# Patient Record
Sex: Female | Born: 1970
Health system: Southern US, Community
[De-identification: ages and names within clinical notes are randomized; demographics above are authoritative.]

## PROBLEM LIST (undated history)

## (undated) DIAGNOSIS — R112 Nausea with vomiting, unspecified: Secondary | ICD-10-CM

## (undated) DIAGNOSIS — D3500 Benign neoplasm of unspecified adrenal gland: Secondary | ICD-10-CM

## (undated) DIAGNOSIS — Z87730 Personal history of (corrected) cleft lip and palate: Secondary | ICD-10-CM

## (undated) DIAGNOSIS — E119 Type 2 diabetes mellitus without complications: Secondary | ICD-10-CM

## (undated) DIAGNOSIS — F419 Anxiety disorder, unspecified: Secondary | ICD-10-CM

## (undated) DIAGNOSIS — I499 Cardiac arrhythmia, unspecified: Secondary | ICD-10-CM

## (undated) DIAGNOSIS — C801 Malignant (primary) neoplasm, unspecified: Secondary | ICD-10-CM

## (undated) DIAGNOSIS — E278 Other specified disorders of adrenal gland: Secondary | ICD-10-CM

## (undated) DIAGNOSIS — D0511 Intraductal carcinoma in situ of right breast: Secondary | ICD-10-CM

## (undated) DIAGNOSIS — K219 Gastro-esophageal reflux disease without esophagitis: Secondary | ICD-10-CM

## (undated) DIAGNOSIS — G4730 Sleep apnea, unspecified: Secondary | ICD-10-CM

## (undated) DIAGNOSIS — Z9889 Other specified postprocedural states: Secondary | ICD-10-CM

## (undated) DIAGNOSIS — D72829 Elevated white blood cell count, unspecified: Secondary | ICD-10-CM

## (undated) HISTORY — PX: CHOLECYSTECTOMY: SHX55

## (undated) HISTORY — PX: CLEFT PALATE REPAIR: SUR1165

## (undated) MED FILL — Fosaprepitant Dimeglumine For IV Infusion 150 MG (Base Eq): INTRAVENOUS | Qty: 5 | Status: AC

---
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
## 1998-07-17 ENCOUNTER — Other Ambulatory Visit: Admission: RE | Admit: 1998-07-17 | Discharge: 1998-07-17 | Payer: Self-pay | Admitting: Obstetrics & Gynecology

## 1998-11-15 ENCOUNTER — Other Ambulatory Visit: Admission: RE | Admit: 1998-11-15 | Discharge: 1998-11-15 | Payer: Self-pay | Admitting: Obstetrics & Gynecology

## 2002-12-09 ENCOUNTER — Emergency Department (HOSPITAL_COMMUNITY): Admission: EM | Admit: 2002-12-09 | Discharge: 2002-12-10 | Payer: Self-pay | Admitting: *Deleted

## 2002-12-09 ENCOUNTER — Encounter: Payer: Self-pay | Admitting: *Deleted

## 2004-12-01 ENCOUNTER — Emergency Department: Payer: Self-pay | Admitting: Emergency Medicine

## 2006-07-03 ENCOUNTER — Ambulatory Visit: Payer: Self-pay | Admitting: Unknown Physician Specialty

## 2007-05-19 ENCOUNTER — Ambulatory Visit: Payer: Self-pay | Admitting: Gynecologic Oncology

## 2007-09-27 ENCOUNTER — Ambulatory Visit: Payer: Self-pay | Admitting: Gynecologic Oncology

## 2007-10-25 ENCOUNTER — Ambulatory Visit: Payer: Self-pay | Admitting: Gynecologic Oncology

## 2008-02-06 ENCOUNTER — Emergency Department: Payer: Self-pay | Admitting: Emergency Medicine

## 2008-02-06 ENCOUNTER — Other Ambulatory Visit: Payer: Self-pay

## 2008-09-19 ENCOUNTER — Ambulatory Visit: Payer: Self-pay | Admitting: Emergency Medicine

## 2008-10-12 ENCOUNTER — Emergency Department: Payer: Self-pay | Admitting: Unknown Physician Specialty

## 2008-11-10 ENCOUNTER — Ambulatory Visit: Payer: Self-pay | Admitting: Internal Medicine

## 2008-12-30 ENCOUNTER — Ambulatory Visit: Payer: Self-pay | Admitting: Internal Medicine

## 2009-02-01 ENCOUNTER — Ambulatory Visit: Payer: Self-pay | Admitting: Internal Medicine

## 2009-03-13 ENCOUNTER — Ambulatory Visit: Payer: Self-pay | Admitting: Internal Medicine

## 2009-04-15 ENCOUNTER — Ambulatory Visit: Payer: Self-pay | Admitting: Internal Medicine

## 2009-08-28 ENCOUNTER — Ambulatory Visit: Payer: Self-pay | Admitting: Family Medicine

## 2010-01-26 ENCOUNTER — Ambulatory Visit: Payer: Self-pay | Admitting: Internal Medicine

## 2010-02-23 ENCOUNTER — Ambulatory Visit: Payer: Self-pay | Admitting: Internal Medicine

## 2010-03-09 ENCOUNTER — Ambulatory Visit: Payer: Self-pay | Admitting: Family Medicine

## 2010-06-12 ENCOUNTER — Ambulatory Visit: Payer: Self-pay | Admitting: Internal Medicine

## 2010-06-16 ENCOUNTER — Ambulatory Visit: Payer: Self-pay | Admitting: Internal Medicine

## 2010-12-13 ENCOUNTER — Emergency Department: Payer: Self-pay

## 2011-03-26 ENCOUNTER — Ambulatory Visit: Payer: Self-pay | Admitting: Internal Medicine

## 2011-04-03 ENCOUNTER — Ambulatory Visit: Payer: Self-pay | Admitting: Family Medicine

## 2011-05-08 ENCOUNTER — Ambulatory Visit: Payer: Self-pay | Admitting: Family Medicine

## 2011-09-03 ENCOUNTER — Ambulatory Visit: Payer: Self-pay

## 2011-11-23 ENCOUNTER — Ambulatory Visit: Payer: Self-pay | Admitting: Family Medicine

## 2011-12-02 ENCOUNTER — Emergency Department: Payer: Self-pay

## 2011-12-02 LAB — CBC
HCT: 44.1 % (ref 35.0–47.0)
HGB: 14.6 g/dL (ref 12.0–16.0)
MCH: 27.3 pg (ref 26.0–34.0)
MCHC: 33 g/dL (ref 32.0–36.0)
MCV: 83 fL (ref 80–100)
Platelet: 262 10*3/uL (ref 150–440)
RBC: 5.33 10*6/uL — ABNORMAL HIGH (ref 3.80–5.20)
RDW: 14.3 % (ref 11.5–14.5)
WBC: 13.1 10*3/uL — ABNORMAL HIGH (ref 3.6–11.0)

## 2011-12-02 LAB — COMPREHENSIVE METABOLIC PANEL
Albumin: 4.3 g/dL (ref 3.4–5.0)
Alkaline Phosphatase: 106 U/L (ref 50–136)
Anion Gap: 9 (ref 7–16)
BUN: 13 mg/dL (ref 7–18)
Bilirubin,Total: 0.7 mg/dL (ref 0.2–1.0)
Calcium, Total: 9.5 mg/dL (ref 8.5–10.1)
Chloride: 100 mmol/L (ref 98–107)
Co2: 30 mmol/L (ref 21–32)
Creatinine: 0.81 mg/dL (ref 0.60–1.30)
EGFR (African American): >60
EGFR (Non-African Amer.): >60
Glucose: 101 mg/dL — ABNORMAL HIGH (ref 65–99)
Osmolality: 278 (ref 275–301)
Potassium: 3.3 mmol/L — ABNORMAL LOW (ref 3.5–5.1)
SGOT(AST): 51 U/L — ABNORMAL HIGH (ref 15–37)
SGPT (ALT): 82 U/L — ABNORMAL HIGH
Sodium: 139 mmol/L (ref 136–145)
Total Protein: 8.5 g/dL — ABNORMAL HIGH (ref 6.4–8.2)

## 2011-12-02 LAB — URINALYSIS, COMPLETE
Bacteria: NONE SEEN
Bilirubin,UR: NEGATIVE
Blood: NEGATIVE
Glucose,UR: NEGATIVE mg/dL (ref 0–75)
Ketone: NEGATIVE
Leukocyte Esterase: NEGATIVE
Nitrite: NEGATIVE
Ph: 6 (ref 4.5–8.0)
Protein: NEGATIVE
RBC,UR: 1 /HPF (ref 0–5)
Specific Gravity: 1.011 (ref 1.003–1.030)
Squamous Epithelial: <1
WBC UR: <1 /HPF (ref 0–5)

## 2011-12-02 LAB — LIPASE, BLOOD: Lipase: 99 U/L (ref 73–393)

## 2011-12-02 LAB — PREGNANCY, URINE: Pregnancy Test, Urine: NEGATIVE m[IU]/mL

## 2012-12-10 ENCOUNTER — Ambulatory Visit: Payer: Self-pay

## 2013-02-01 ENCOUNTER — Ambulatory Visit: Payer: Self-pay

## 2013-02-22 ENCOUNTER — Ambulatory Visit: Payer: Self-pay | Admitting: Emergency Medicine

## 2013-06-04 LAB — COMPREHENSIVE METABOLIC PANEL
Albumin: 4.2 g/dL (ref 3.4–5.0)
Alkaline Phosphatase: 111 U/L (ref 50–136)
Anion Gap: 7 (ref 7–16)
BUN: 9 mg/dL (ref 7–18)
Bilirubin,Total: 0.5 mg/dL (ref 0.2–1.0)
Calcium, Total: 9.4 mg/dL (ref 8.5–10.1)
Chloride: 104 mmol/L (ref 98–107)
Co2: 25 mmol/L (ref 21–32)
Creatinine: 0.87 mg/dL (ref 0.60–1.30)
EGFR (African American): >60
EGFR (Non-African Amer.): >60
Glucose: 104 mg/dL — ABNORMAL HIGH (ref 65–99)
Osmolality: 271 (ref 275–301)
Potassium: 3.1 mmol/L — ABNORMAL LOW (ref 3.5–5.1)
SGOT(AST): 24 U/L (ref 15–37)
SGPT (ALT): 36 U/L (ref 12–78)
Sodium: 136 mmol/L (ref 136–145)
Total Protein: 7.8 g/dL (ref 6.4–8.2)

## 2013-06-04 LAB — URINALYSIS, COMPLETE
Bacteria: NONE SEEN
Bilirubin,UR: NEGATIVE
Glucose,UR: NEGATIVE mg/dL (ref 0–75)
Ketone: NEGATIVE
Leukocyte Esterase: NEGATIVE
Nitrite: NEGATIVE
Ph: 5 (ref 4.5–8.0)
Protein: NEGATIVE
RBC,UR: 2 /HPF (ref 0–5)
Specific Gravity: 1.013 (ref 1.003–1.030)
Squamous Epithelial: 1
WBC UR: 3 /HPF (ref 0–5)

## 2013-06-04 LAB — CBC
HCT: 41.2 % (ref 35.0–47.0)
HGB: 13.8 g/dL (ref 12.0–16.0)
MCH: 27.1 pg (ref 26.0–34.0)
MCHC: 33.6 g/dL (ref 32.0–36.0)
MCV: 81 fL (ref 80–100)
Platelet: 237 10*3/uL (ref 150–440)
RBC: 5.11 10*6/uL (ref 3.80–5.20)
RDW: 13.9 % (ref 11.5–14.5)
WBC: 17.7 10*3/uL — ABNORMAL HIGH (ref 3.6–11.0)

## 2013-06-04 LAB — LIPASE, BLOOD: Lipase: 144 U/L (ref 73–393)

## 2013-06-04 LAB — TROPONIN I: Troponin-I: <0.02 ng/mL

## 2013-06-05 ENCOUNTER — Observation Stay: Payer: Self-pay | Admitting: Internal Medicine

## 2013-11-30 ENCOUNTER — Ambulatory Visit: Payer: Self-pay | Admitting: Internal Medicine

## 2014-04-07 LAB — LIPID PANEL
Cholesterol: 226 mg/dL — AB (ref 0–200)
HDL: 55 mg/dL (ref 35–70)
LDL Cholesterol: 149 mg/dL
Triglycerides: 112 mg/dL (ref 40–160)

## 2014-04-07 LAB — CBC AND DIFFERENTIAL: Hemoglobin: 13.9 g/dL (ref 12.0–16.0)

## 2014-04-07 LAB — BASIC METABOLIC PANEL
BUN: 19 mg/dL (ref 4–21)
Creatinine: 0.7 mg/dL (ref <=1.1)

## 2014-04-07 LAB — TSH: TSH: 0.58 u[IU]/mL (ref <=5.90)

## 2014-08-16 DIAGNOSIS — E250 Congenital adrenogenital disorders associated with enzyme deficiency: Secondary | ICD-10-CM | POA: Insufficient documentation

## 2014-12-16 NOTE — H&P (Signed)
PATIENT NAME:  Tina Carrillo, Tina Carrillo MR#:  [id number] DATE OF BIRTH:  Sep 03, 1970  DATE OF ADMISSION:  06/05/2013  REFERRING PHYSICIAN:  Dr. Corky Downs.   PRIMARY CARE PHYSICIAN:  Dr. Army Melia.   ENDOCRINOLOGIST:  Dr. Gabriel Carina.   CHIEF COMPLAINT:  Fevers, chills, sinus pain.   HISTORY OF PRESENT ILLNESS:  Tina Carrillo is a 44 year old Caucasian female with past medical history significant for congenital adrenal hyperplasia who is on chronic steroid therapy, who is presenting with subjective fever, chills and sinus pressure.  Symptoms began approximately three days ago after being exposed to a large amount of dust at work.  She noticed having frontal sinus pressure with greenish nasal discharge.  She also denotes having many positive sick contacts at work.  She notes said that she also has subjective fever and chills now complaining of chest congestion without any cough or shortness of breath.  Given her history of congenital adrenal hyperplasia she is on chronic steroids.  She has increased the dose of prednisone today, the day of admission, but presented regardless for worsening symptoms with associated generalized malaise.  She notes having multiple bouts of nonbloody, nonbilious emesis prior to arrival to the hospital without any diarrhea or constipation.  Currently, she is complaining only of sinus pressure.  No other complaints.    REVIEW OF SYSTEMS: CONSTITUTIONAL:  Positive for subjective fevers and chills as above.  Denies any weight loss or weakness.  EYES:  Denies any blurry vision, double vision or eye pain.  EARS, NOSE, THROAT:  Denies any ear pain or hearing loss.  Mentions nasal discharge and sinus pressure as above.  RESPIRATORY:  Positive for chest congestion, however denies any cough, wheeze or shortness of breath.  CARDIOVASCULAR:  Denies chest pain, palpitations, edema.  GASTROINTESTINAL:  Positive for nausea and emesis as above.  Denies any diarrhea or abdominal pain.  GENITOURINARY:  She  denies dysuria, hematuria.  ENDOCRINE:  Denies nocturia or thyroid problems.  HEMATOLOGIC AND LYMPHATIC:  Denies any easy bruising or bleeding.  SKIN:  Denies rashes or lesions.  MUSCULOSKELETAL:  Denies any pain in her back, neck, knees.  Denies any arthritis.  Denies any joint swelling.  NEUROLOGIC:  Denies any paralysis or paresthesias.  PSYCHIATRIC:  Denies any anxiety or depressive symptoms.   PAST MEDICAL HISTORY:  Congenital adrenal hyperplasia, recurrent sinusitis, history of cleft palate.   FAMILY HISTORY:  Mother with lupus.  Father with coronary artery disease as well as hypertension.   SOCIAL HISTORY:  Denies any tobacco use, occasional social alcohol intake.  Denies any drug use.  Works at [employer] as a Freight forwarder.   ALLERGIES:  CODEINE, HYDROCODONE, MORPHINE, NSAIDS AND TRAMADOL.   HOME MEDICATIONS:  Include fludrocortisone 0.1 mg by mouth daily, prednisone 5 mg 1 tab in the morning, 1/2 tab in the evening.   PHYSICAL EXAMINATION: VITAL SIGNS:  Temperature 99.9, heart rate 102, respirations 20, blood pressure 117/68, saturating 100% on room air.  Weight 83.9 kg, BMI of 36.1.  GENERAL:  Well-nourished, well-developed Caucasian female who is in no acute distress.  HEAD:  Normocephalic, atraumatic.  EYES:  Pupils equal, round, react to light, extraocular muscles intact.  No scleral icterus.  MOUTH:  Moist mucosal membranes.  Dentition intact.  No abscesses noted.   EARS, NOSE AND THROAT:  Clear without exudate.  No external lesions.  There is some erythema of the nasal turbinates bilaterally.  NECK:  Supple.  No thyromegaly or lymphadenopathy.  No JVD.  PULMONARY:  Clear to auscultation bilaterally without wheezes, rubs or rhonchi.  No use of accessory muscles.  Good respiratory effort.  CHEST:  Nontender to palpation.  CARDIOVASCULAR:  S1, S2, regular rate and rhythm.  No murmurs, rubs or gallops.  No peripheral edema.  Pedal pulses 2+ bilaterally.   GASTROINTESTINAL:  Soft, nontender, nondistended.  No masses.  No hepatosplenomegaly.  Positive bowel sounds.  MUSCULOSKELETAL:  No swelling, clubbing or edema.  Range of motion full in all extremities.  NEUROLOGIC:  Cranial nerves II through XII intact.  No gross neurological deficits.  Sensation and reflexes intact.  SKIN:  No ulcerations, lesions, rashes or cyanosis.  Skin is warm and dry.  Turgor syntax.  PSYCHIATRIC:  Mood and affect within normal limits.  Awake, alert, oriented x 3.  Insight and judgment intact.   LABORATORY DATA:  Sodium 136, potassium 3.1, chloride 104, bicarb 25, BUN 9, creatinine 0.87, glucose 104.  WBC 17.7, hemoglobin 13.8, hematocrit 41.2, platelets 273.  Urinalysis negative for signs of infection.  Chest x-ray:  Shallow lung volumes.  No acute cardiopulmonary process.   ASSESSMENT AND PLAN:  A 44 year old Caucasian female with history of congenital adrenal hyperplasia on chronic steroids presenting with fevers, chills and sinus pressure.  1.  Acute adrenal insufficiency.  She has been given dexamethasone 4 mg IV once.  We will continue this medication every 12 hours.  Consult endocrinology.  IV fluid hydration.  Cortisol level was drawn.    2.  Sinusitis.  She has been given Levaquin.  No clear indication for continued antibiotics given the duration of her symptoms has been 2 to 3 days and she has multiple positive sick contacts with similar conditions.  3.  Hypokalemia.  Replace with a goal potassium of 4.  4.  DVT prophylaxis.  Heparin subQ.  5.  THE PATIENT IS FULL CODE.   TIME SPENT:  45 minutes.     ____________________________ Aaron Mose. Hower, MD dkh:ea D: 06/05/2013 01:02:28 ET T: 06/05/2013 01:39:28 ET JOB#: [id number]  cc: Aaron Mose. Hower, MD, <Dictator> DAVID Woodfin Ganja MD ELECTRONICALLY SIGNED 06/05/2013 23:26

## 2014-12-16 NOTE — Discharge Summary (Signed)
PATIENT NAME:  Tina Carrillo, MCKESSON MR#:  [id number] DATE OF BIRTH:  08-15-1971  DATE OF ADMISSION:  06/05/2013 DATE OF DISCHARGE:  06/05/2013  ADMISSION DIAGNOSES: 1.  Adrenal insufficiency.  2.  Sinusitis.  DISCHARGE DIAGNOSES: 1.  Adrenal insufficiency.  2.  Acute sinusitis.   CONSULTATIONS:  None.   HOSPITAL COURSE: This is a 44 year old female with a history of adrenal hyperplasia, congenital, who presented with an acute adrenal insufficiency and acute sinusitis.  For further details, please refer to the H and P.   1.  Acute adrenal insufficiency, which was thought to be decompensated from her acute sinusitis. The patient was admitted with IV steroids. She had no issues with her blood pressure but because of her acute infection, it was thought that she may go into adrenal insufficiency. She was admitted observation overnight. Her blood pressure was stable. She was completely stable. Her IV steroids were discontinued. We did ask her to increase her p.o. steroids for a few days to get over the acute sinusitis and see Dr. Gabriel Carina on Monday. She was restarted back on her Florinef. 2.  Acute sinusitis, likely viral, but the patient says her symptoms improved with antibiotics. She will be discharged with amoxicillin and follow up with Dr. Gabriel Carina.   DISCHARGE MEDICATIONS: 1.  Florinef 0.1 mg daily.  2.  Prednisone 5 mg p.o. b.i.d. for the next few days and then she can take 5 mg in the morning and 2.5 mg in the evening.  3.  Amoxicillin 500 mg q. 8 hours x 10 days.   DISCHARGE DIET:  Regular.   DISCHARGE ACTIVITY:  As tolerated.  DISCHARGE FOLLOWUP:  The patient will follow up on Monday or Tuesday with Dr. Gabriel Carina and in 2 weeks with Dr. Army Melia.  The patient is medically stable for discharge.   TIME SPENT:  35 minutes   ____________________________ Malaya Cagley P. Benjie Karvonen, MD spm:ce D: 06/05/2013 10:55:47 ET T: 06/05/2013 11:46:51 ET JOB#: [id number]  cc: Brent Noto P. Benjie Karvonen, MD, <Dictator> A. Lavone Orn, MD Halina Maidens, MD  Donell Beers Braeton Wolgamott MD ELECTRONICALLY SIGNED 06/06/2013 13:02

## 2015-03-23 ENCOUNTER — Emergency Department: Payer: BLUE CROSS/BLUE SHIELD

## 2015-03-23 ENCOUNTER — Emergency Department
Admission: EM | Admit: 2015-03-23 | Discharge: 2015-03-23 | Disposition: A | Discharge status: Home / Self Care | Payer: BLUE CROSS/BLUE SHIELD | Attending: Emergency Medicine | Admitting: Emergency Medicine

## 2015-03-23 DIAGNOSIS — Z7952 Long term (current) use of systemic steroids: Secondary | ICD-10-CM | POA: Diagnosis not present

## 2015-03-23 DIAGNOSIS — Y9339 Activity, other involving climbing, rappelling and jumping off: Secondary | ICD-10-CM | POA: Insufficient documentation

## 2015-03-23 DIAGNOSIS — Y92410 Unspecified street and highway as the place of occurrence of the external cause: Secondary | ICD-10-CM | POA: Insufficient documentation

## 2015-03-23 DIAGNOSIS — Y998 Other external cause status: Secondary | ICD-10-CM | POA: Insufficient documentation

## 2015-03-23 DIAGNOSIS — S86912A Strain of unspecified muscle(s) and tendon(s) at lower leg level, left leg, initial encounter: Secondary | ICD-10-CM | POA: Insufficient documentation

## 2015-03-23 DIAGNOSIS — Z79899 Other long term (current) drug therapy: Secondary | ICD-10-CM | POA: Diagnosis not present

## 2015-03-23 DIAGNOSIS — X58XXXA Exposure to other specified factors, initial encounter: Secondary | ICD-10-CM | POA: Diagnosis not present

## 2015-03-23 DIAGNOSIS — S8992XA Unspecified injury of left lower leg, initial encounter: Secondary | ICD-10-CM | POA: Diagnosis present

## 2015-03-23 MED ORDER — TRAMADOL HCL 50 MG PO TABS: 50.0000 mg | ORAL_TABLET | Freq: Four times a day (QID) | ORAL | Status: DC | PRN

## 2015-03-23 MED ORDER — MELOXICAM 15 MG PO TABS: 15.0000 mg | ORAL_TABLET | Freq: Every day | ORAL | Status: DC

## 2015-03-23 MED ORDER — HYDROCODONE-ACETAMINOPHEN 5-325 MG PO TABS: 1.0000 | ORAL_TABLET | ORAL | Status: DC | PRN

## 2015-03-23 NOTE — ED Notes (Signed)
Pt stepped out of truck and landed on left knee; pt complains of left knee pain

## 2015-03-23 NOTE — ED Notes (Signed)
Ice pack given and placed on left knee

## 2015-03-23 NOTE — ED Notes (Signed)
AAOx3.  Skin warm and dry.  NAD 

## 2015-03-23 NOTE — ED Provider Notes (Signed)
[facility] Emergency Department Provider Note ____________________________________________  Time seen: Approximately 3:46 PM  I have reviewed the triage vital signs and the nursing notes.   HISTORY  Chief Complaint Knee Pain   HPI Tina Carrillo is a 44 y.o. female who presents to the emergency department for evaluation of left knee pain. She states she jumped from the tailgate of a truck and felt her knee "give way." She has pain in the anterior part of the knee that is not improving with rest and tylenol. Pain worse with movement and weight bearing.   No past medical history on file.  There are no active problems to display for this patient.   No past surgical history on file.  Current Outpatient Rx  Name  Route  Sig  Dispense  Refill  . dexamethasone (DECADRON) 0.5 MG tablet   Oral   Take 0.5 mg by mouth daily.         Marland Kitchen dexamethasone (DECADRON) 0.5 MG tablet   Oral   Take 1 mg by mouth daily.         . fludrocortisone (FLORINEF) 0.1 MG tablet   Oral   Take 0.1 mg by mouth daily.         Marland Kitchen HYDROcodone-acetaminophen (NORCO/VICODIN) 5-325 MG per tablet   Oral   Take 1 tablet by mouth every 4 (four) hours as needed.   12 tablet   0     Allergies Morphine and related and Oxycodone  No family history on file.  Social History History  Substance Use Topics  . Smoking status: Not on file  . Smokeless tobacco: Not on file  . Alcohol Use: Not on file    Review of Systems Constitutional: No recent illness. Eyes: No visual changes. ENT: No sore throat. Cardiovascular: Denies chest pain or palpitations. Respiratory: Denies shortness of breath. Gastrointestinal: No abdominal pain.  Genitourinary: Negative for dysuria. Musculoskeletal: Pain in left knee. Skin: Negative for rash. Neurological: Negative for headaches, focal weakness or numbness. 10-point ROS otherwise  negative.  ____________________________________________   PHYSICAL EXAM:  VITAL SIGNS: ED Triage Vitals  Enc Vitals Group     BP 03/23/15 1128 144/91 mmHg     Pulse Rate 03/23/15 1128 94     Resp 03/23/15 1128 20     Temp 03/23/15 1128 98.4 F (36.9 C)     Temp Source 03/23/15 1128 Oral     SpO2 03/23/15 1128 100 %     Weight 03/23/15 1124 200 lb (90.719 kg)     Height 03/23/15 1124 4' 11"  (1.499 m)     Head Cir --      Peak Flow --      Pain Score 03/23/15 1125 5     Pain Loc --      Pain Edu? --      Excl. in [place]? --     Constitutional: Alert and oriented. Well appearing and in no acute distress. Eyes: Conjunctivae are normal. EOMI. Head: Atraumatic. Nose: No congestion/rhinnorhea. Neck: No stridor.  Respiratory: Normal respiratory effort.   Musculoskeletal: Pain at lateral joint line and below the patella with valgus stress. No laxity. Neurologic:  Normal speech and language. No gross focal neurologic deficits are appreciated. Speech is normal. No gait instability. Skin:  Skin is warm, dry and intact. Atraumatic. Psychiatric: Mood and affect are normal. Speech and behavior are normal.  ____________________________________________   LABS (all labs ordered are listed, but only abnormal results are displayed)  Labs  Reviewed - No data to display ____________________________________________  RADIOLOGY  Negative for acute bony abnormality. ____________________________________________   PROCEDURES  Procedure(s) performed: Knee immobilizer applied by RN.   ____________________________________________   INITIAL IMPRESSION / ASSESSMENT AND PLAN / ED COURSE  Pertinent labs & imaging results that were available during my care of the patient were reviewed by me and considered in my medical decision making (see chart for details).  Patient was advised to wear the knee immobilizer for a week and follow up with the orthopedic doctor if not improving. She was advised  to return to the ER for symptoms that change or worsen if unable to schedule an appointment. ____________________________________________   FINAL CLINICAL IMPRESSION(S) / ED DIAGNOSES  Final diagnoses:  Knee strain, left, initial encounter       Victorino Dike, FNP 03/23/15 1622  Harvest Dark, MD 03/24/15 1245

## 2015-06-02 ENCOUNTER — Other Ambulatory Visit: Payer: Self-pay | Admitting: Gastroenterology

## 2015-06-02 DIAGNOSIS — R1013 Epigastric pain: Secondary | ICD-10-CM

## 2015-06-02 DIAGNOSIS — R635 Abnormal weight gain: Secondary | ICD-10-CM

## 2015-06-07 ENCOUNTER — Ambulatory Visit
Admission: RE | Admit: 2015-06-07 | Discharge: 2015-06-07 | Disposition: A | Discharge status: Home / Self Care | Payer: PRIVATE HEALTH INSURANCE | Source: Ambulatory Visit | Attending: Gastroenterology | Admitting: Gastroenterology

## 2015-06-07 DIAGNOSIS — N289 Disorder of kidney and ureter, unspecified: Secondary | ICD-10-CM | POA: Insufficient documentation

## 2015-06-07 DIAGNOSIS — R1013 Epigastric pain: Secondary | ICD-10-CM | POA: Insufficient documentation

## 2015-06-07 DIAGNOSIS — Z9049 Acquired absence of other specified parts of digestive tract: Secondary | ICD-10-CM | POA: Diagnosis not present

## 2015-06-07 DIAGNOSIS — K760 Fatty (change of) liver, not elsewhere classified: Secondary | ICD-10-CM | POA: Diagnosis not present

## 2015-06-07 DIAGNOSIS — R635 Abnormal weight gain: Secondary | ICD-10-CM | POA: Diagnosis present

## 2015-06-14 ENCOUNTER — Other Ambulatory Visit: Payer: Self-pay | Admitting: Internal Medicine

## 2015-06-14 ENCOUNTER — Encounter: Payer: Self-pay | Admitting: Internal Medicine

## 2015-06-14 DIAGNOSIS — J3089 Other allergic rhinitis: Secondary | ICD-10-CM

## 2015-10-17 DIAGNOSIS — E119 Type 2 diabetes mellitus without complications: Secondary | ICD-10-CM | POA: Insufficient documentation

## 2015-10-30 ENCOUNTER — Other Ambulatory Visit: Payer: Self-pay | Admitting: Urology

## 2015-10-30 DIAGNOSIS — N281 Cyst of kidney, acquired: Secondary | ICD-10-CM

## 2015-11-21 ENCOUNTER — Ambulatory Visit: Payer: BLUE CROSS/BLUE SHIELD

## 2015-12-11 ENCOUNTER — Other Ambulatory Visit: Payer: Self-pay | Admitting: Urology

## 2015-12-11 DIAGNOSIS — N281 Cyst of kidney, acquired: Secondary | ICD-10-CM

## 2015-12-27 ENCOUNTER — Ambulatory Visit
Admission: RE | Admit: 2015-12-27 | Discharge: 2015-12-27 | Disposition: A | Discharge status: Home / Self Care | Payer: BLUE CROSS/BLUE SHIELD | Source: Ambulatory Visit | Attending: Urology | Admitting: Urology

## 2015-12-27 DIAGNOSIS — N281 Cyst of kidney, acquired: Secondary | ICD-10-CM

## 2015-12-27 DIAGNOSIS — D3501 Benign neoplasm of right adrenal gland: Secondary | ICD-10-CM | POA: Insufficient documentation

## 2015-12-27 DIAGNOSIS — D1779 Benign lipomatous neoplasm of other sites: Secondary | ICD-10-CM | POA: Insufficient documentation

## 2015-12-27 DIAGNOSIS — R1011 Right upper quadrant pain: Secondary | ICD-10-CM | POA: Diagnosis not present

## 2015-12-27 MED ORDER — GADOBENATE DIMEGLUMINE 529 MG/ML IV SOLN
20.0000 mL | Freq: Once | INTRAVENOUS | Status: AC | PRN
  Administered 2015-12-27: 20 mL via INTRAVENOUS

## 2016-01-01 DIAGNOSIS — R110 Nausea: Secondary | ICD-10-CM | POA: Diagnosis not present

## 2016-01-01 DIAGNOSIS — M5489 Other dorsalgia: Secondary | ICD-10-CM | POA: Diagnosis not present

## 2016-01-08 ENCOUNTER — Emergency Department: Payer: BLUE CROSS/BLUE SHIELD

## 2016-01-08 ENCOUNTER — Emergency Department
Admission: EM | Admit: 2016-01-08 | Discharge: 2016-01-08 | Disposition: A | Discharge status: Home / Self Care | Payer: BLUE CROSS/BLUE SHIELD | Attending: Emergency Medicine | Admitting: Emergency Medicine

## 2016-01-08 ENCOUNTER — Other Ambulatory Visit: Payer: Self-pay

## 2016-01-08 ENCOUNTER — Encounter: Payer: Self-pay | Admitting: Emergency Medicine

## 2016-01-08 DIAGNOSIS — R110 Nausea: Secondary | ICD-10-CM | POA: Diagnosis not present

## 2016-01-08 DIAGNOSIS — R401 Stupor: Secondary | ICD-10-CM | POA: Diagnosis not present

## 2016-01-08 DIAGNOSIS — R531 Weakness: Secondary | ICD-10-CM | POA: Diagnosis not present

## 2016-01-08 DIAGNOSIS — R232 Flushing: Secondary | ICD-10-CM | POA: Diagnosis not present

## 2016-01-08 DIAGNOSIS — E876 Hypokalemia: Secondary | ICD-10-CM | POA: Diagnosis not present

## 2016-01-08 DIAGNOSIS — D72829 Elevated white blood cell count, unspecified: Secondary | ICD-10-CM | POA: Diagnosis not present

## 2016-01-08 HISTORY — DX: Other specified disorders of adrenal gland: E27.8

## 2016-01-08 HISTORY — DX: Benign neoplasm of unspecified adrenal gland: D35.00

## 2016-01-08 LAB — BASIC METABOLIC PANEL
Anion gap: 12 (ref 5–15)
BUN: 17 mg/dL (ref 6–20)
CO2: 22 mmol/L (ref 22–32)
Calcium: 9.7 mg/dL (ref 8.9–10.3)
Chloride: 102 mmol/L (ref 101–111)
Creatinine, Ser: 0.67 mg/dL (ref 0.44–1.00)
GFR calc Af Amer: >60 mL/min (ref >60)
GFR calc non Af Amer: >60 mL/min (ref >60)
Glucose, Bld: 129 mg/dL — ABNORMAL HIGH (ref 65–99)
Potassium: 3.2 mmol/L — ABNORMAL LOW (ref 3.5–5.1)
Sodium: 136 mmol/L (ref 135–145)

## 2016-01-08 LAB — HEPATIC FUNCTION PANEL
ALT: 28 U/L (ref 14–54)
AST: 23 U/L (ref 15–41)
Albumin: 4.5 g/dL (ref 3.5–5.0)
Alkaline Phosphatase: 81 U/L (ref 38–126)
Bilirubin, Direct: <0.1 mg/dL — ABNORMAL LOW (ref 0.1–0.5)
Total Bilirubin: 0.6 mg/dL (ref 0.3–1.2)
Total Protein: 7.7 g/dL (ref 6.5–8.1)

## 2016-01-08 LAB — URINALYSIS COMPLETE WITH MICROSCOPIC (ARMC ONLY)
Bacteria, UA: NONE SEEN
Bilirubin Urine: NEGATIVE
Glucose, UA: NEGATIVE mg/dL
Hgb urine dipstick: NEGATIVE
Leukocytes, UA: NEGATIVE
Nitrite: NEGATIVE
Protein, ur: NEGATIVE mg/dL
RBC / HPF: NONE SEEN RBC/hpf (ref 0–5)
Specific Gravity, Urine: 1.009 (ref 1.005–1.030)
pH: 6 (ref 5.0–8.0)

## 2016-01-08 LAB — CBC
HCT: 41.4 % (ref 35.0–47.0)
Hemoglobin: 13.6 g/dL (ref 12.0–16.0)
MCH: 26.3 pg (ref 26.0–34.0)
MCHC: 32.8 g/dL (ref 32.0–36.0)
MCV: 80 fL (ref 80.0–100.0)
Platelets: 279 10*3/uL (ref 150–440)
RBC: 5.18 MIL/uL (ref 3.80–5.20)
RDW: 14.5 % (ref 11.5–14.5)
WBC: 16.7 10*3/uL — ABNORMAL HIGH (ref 3.6–11.0)

## 2016-01-08 LAB — TROPONIN I: Troponin I: <0.03 ng/mL (ref <0.031)

## 2016-01-08 MED ORDER — SODIUM CHLORIDE 0.9 % IV BOLUS (SEPSIS)
1000.0000 mL | Freq: Once | INTRAVENOUS | Status: AC
  Administered 2016-01-08: 1000 mL via INTRAVENOUS

## 2016-01-08 NOTE — ED Notes (Signed)
D/c inst to pt.  Iv d'ced.

## 2016-01-08 NOTE — ED Notes (Signed)
Pt alert.  nsr on monitor.   Skin warm and dry  Iv fluids infusing.

## 2016-01-08 NOTE — Discharge Instructions (Signed)
You were evaluated for hot and flushed feeling, and although no certain cause was found, your exam and evaluation are reassuring. Your white blood cell count was elevated raising suspicion for possible infection, although no certain source was found.  You would be called if anything grows from the blood culture that was sent.  After discussed with endocrinologist, she recommended that you have a near 8 AM blood draw of "cortisol "which can be done at your primary care physician's office, or [place] clinic.  Return to the emergency room for any worsening condition including fever, chest pain or trouble breathing, confusion altered mental status, weakness or numbness, abdominal pain, or any other symptoms concerning to you.   Hypokalemia Hypokalemia means that the amount of potassium in the blood is lower than normal.Potassium is a chemical, called an electrolyte, that helps regulate the amount of fluid in the body. It also stimulates muscle contraction and helps nerves function properly.Most of the body's potassium is inside of cells, and only a very small amount is in the blood. Because the amount in the blood is so small, minor changes can be life-threatening. CAUSES  Antibiotics.  Diarrhea or vomiting.  Using laxatives too much, which can cause diarrhea.  Chronic kidney disease.  Water pills (diuretics).  Eating disorders (bulimia).  Low magnesium level.  Sweating a lot. SIGNS AND SYMPTOMS  Weakness.  Constipation.  Fatigue.  Muscle cramps.  Mental confusion.  Skipped heartbeats or irregular heartbeat (palpitations).  Tingling or numbness. DIAGNOSIS  Your health care provider can diagnose hypokalemia with blood tests. In addition to checking your potassium level, your health care provider may also check other lab tests. TREATMENT Hypokalemia can be treated with potassium supplements taken by mouth or adjustments in your current medicines. If your potassium level is  very low, you may need to get potassium through a vein (IV) and be monitored in the hospital. A diet high in potassium is also helpful. Foods high in potassium are:  Nuts, such as peanuts and pistachios.  Seeds, such as sunflower seeds and pumpkin seeds.  Peas, lentils, and lima beans.  Whole grain and bran cereals and breads.  Fresh fruit and vegetables, such as apricots, avocado, bananas, cantaloupe, kiwi, oranges, tomatoes, asparagus, and potatoes.  Orange and tomato juices.  Red meats.  Fruit yogurt. HOME CARE INSTRUCTIONS  Take all medicines as prescribed by your health care provider.  Maintain a healthy diet by including nutritious food, such as fruits, vegetables, nuts, whole grains, and lean meats.  If you are taking a laxative, be sure to follow the directions on the label. SEEK MEDICAL CARE IF:  Your weakness gets worse.  You feel your heart pounding or racing.  You are vomiting or having diarrhea.  You are diabetic and having trouble keeping your blood glucose in the normal range. SEEK IMMEDIATE MEDICAL CARE IF:  You have chest pain, shortness of breath, or dizziness.  You are vomiting or having diarrhea for more than 2 days.  You faint. MAKE SURE YOU:   Understand these instructions.  Will watch your condition.  Will get help right away if you are not doing well or get worse.   This information is not intended to replace advice given to you by your health care provider. Make sure you discuss any questions you have with your health care provider.   Document Released: 08/12/2005 Document Revised: 09/02/2014 Document Reviewed: 02/12/2013 Elsevier Interactive Patient Education [employer].

## 2016-01-08 NOTE — ED Notes (Signed)
MD at bedside. 

## 2016-01-08 NOTE — ED Notes (Signed)
Pt here from home via [facility] with c/o feeling dehydrated and hot. 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 Pt alert and oriented upon arrival, skin warm and dry,.

## 2016-01-08 NOTE — ED Notes (Signed)
Resumed care from [facility].  Pt in xray.

## 2016-01-08 NOTE — ED Provider Notes (Signed)
[facility] Emergency Department Provider Note   ____________________________________________  Time seen: Approximately 1:45 PM I have reviewed the triage vital signs and the triage nursing note.  HISTORY  Chief Complaint Weakness   Historian Patient  HPI Tina Carrillo is a 45 y.o. female here for evaluation after a sudden hot flushing feeling all over her body this morning. 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 She states that she's also had electrolyte abnormalities in the past. No palpitations or chest pain. No trouble breathing or shortness breath. No fevers or coughing. Denies abdominal pain. Mild nausea. Nothing to eat this morning it. No diarrhea. No dysuria.  She states that she was born with hyperplasia of the adrenal glands and has an appointment within the month at [facility] to discuss further management. She does take Florinef.   Past Medical History  Diagnosis Date  . Adrenal hyperplasia ([place])   . Adrenal benign tumor     Patient Active Problem List   Diagnosis Date Noted  . Environmental and seasonal allergies 06/14/2015  . Congenital adrenal cortical hyperplasia ([place]) 08/16/2014    Past Surgical History  Procedure Laterality Date  . Cleft palate repair    . Vagina reconstruction surgery    . Cholecystectomy      Current Outpatient Rx  Name  Route  Sig  Dispense  Refill  . dexamethasone (DECADRON) 0.5 MG tablet   Oral   Take 0.5 mg by mouth daily.         Marland Kitchen dexamethasone (DECADRON) 0.5 MG tablet   Oral   Take 1 mg by mouth daily.         Marland Kitchen dicyclomine (BENTYL) 10 MG capsule   Oral   Take 1 capsule by mouth 3 (three) times daily.         . fludrocortisone (FLORINEF) 0.1 MG tablet   Oral   Take 0.1 mg by mouth daily.         Marland Kitchen HYDROcodone-acetaminophen (NORCO/VICODIN) 5-325 MG per tablet   Oral   Take 1 tablet by  mouth every 4 (four) hours as needed.   12 tablet   0     Allergies Morphine and related; Nucynta; and Oxycodone  Family History  Problem Relation Age of Onset  . Ovarian cancer Mother   . Uterine cancer Sister     Social History Social History  Substance Use Topics  . Smoking status: Never Smoker   . Smokeless tobacco: None  . Alcohol Use: No    Review of Systems  Constitutional: Negative for fever. Eyes: Negative for visual changes. ENT: Negative for sore throat. Cardiovascular: Negative for chest pain. Respiratory: Negative for shortness of breath. Gastrointestinal: Negative for abdominal pain, vomiting and diarrhea. Genitourinary: Negative for dysuria. Musculoskeletal: Negative for back pain. Skin: Negative for rash. Neurological: Negative for headache. 10 point Review of Systems otherwise negative ____________________________________________   PHYSICAL EXAM:  VITAL SIGNS: ED Triage Vitals  Enc Vitals Group     BP 01/08/16 1115 144/87 mmHg     Pulse Rate 01/08/16 1115 98     Resp 01/08/16 1115 16     Temp 01/08/16 1110 97.8 F (36.6 C)     Temp Source 01/08/16 1110 Oral     SpO2 01/08/16 1115 100 %     Weight 01/08/16 1110 200 lb (90.719 kg)     Height 01/08/16  1110 4' 11"  (1.499 m)     Head Cir --      Peak Flow --      Pain Score 01/08/16 1111 0     Pain Loc --      Pain Edu? --      Excl. in [place]? --      Constitutional: Alert and oriented. Well appearing and in no distress. HEENT   Head: Normocephalic and atraumatic.      Eyes: Conjunctivae are normal. PERRL. Normal extraocular movements.      Ears:         Nose: No congestion/rhinnorhea.   Mouth/Throat: Mucous membranes are moist.   Neck: No stridor. Cardiovascular/Chest: Normal rate, regular rhythm.  No murmurs, rubs, or gallops. Respiratory: Normal respiratory effort without tachypnea nor retractions. Breath sounds are clear and equal bilaterally. No  wheezes/rales/rhonchi. Gastrointestinal: Soft. No distention, no guarding, no rebound. Nontender.    Genitourinary/rectal:Deferred Musculoskeletal: Nontender with normal range of motion in all extremities. No joint effusions.  No lower extremity tenderness.  No edema. Neurologic:  Normal speech and language. No gross or focal neurologic deficits are appreciated. Skin: Flushed without a skin rash. Psychiatric: Mood and affect are normal. Speech and behavior are normal. Patient exhibits appropriate insight and judgment.  ____________________________________________   EKG I, Lisa Roca, MD, the attending physician have personally viewed and interpreted all ECGs.  90 beats per minute.  normal sinus rhythm. Narrow QRS. Normal axis. Normal ST and T-wave ____________________________________________  LABS (pertinent positives/negatives)  Basic metabolic panel significant for potassium 3.2 White blood count 16.7, hemoglobin 13.6 and platelet count 279 Troponin less than 0.03 hepatic function panel without significant abnormality   ____________________________________________  RADIOLOGY All Xrays were viewed by me. Imaging interpreted by Radiologist.  Chest two-view: No active cardiopulmonary disease __________________________________________  PROCEDURES  Procedure(s) performed: None  Critical Care performed: None  ____________________________________________   ED COURSE / ASSESSMENT AND PLAN  Pertinent labs & imaging results that were available during my care of the patient were reviewed by me and considered in my medical decision making (see chart for details).   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 She does appear flushed. Her vitals are reassuring.  She is found to have a slightly low potassium, looks like she's had this previously. I will give her dose of potassium here.  Her white blood count is elevated, unclear  etiology. She is not getting of respirations symptoms or gastritis all systems, skin symptoms, I will go ahead and add on chest x-ray, hepatic function panel, urinalysis, and blood cultures.  She received 1 L fluid bolus and is requesting a second liter which I will go ahead and provide.   I discussed this case with Dr. Graceann Congress, endocrinology regarding her history of adrenal hyperplasia and symptomology. Clinically she doesn't appear to be in an adrenal insufficiency crisis. No abdominal pain, headache, hypotension.  Endocrinologist recommended that she have a morning cortisol drawn, this afternoon is too late to screen for general insufficiency. Patient will be instructed to follow up with her primary care physician for blood draw tomorrow morning. She'll be referred to [facility] if she cannot get in with her primary doctor.  Patient's well-appearing and I'm okay discharging her home now.    CONSULTATIONS:   Endocrinology by phone.   Patient / Family / Caregiver informed of clinical course, medical decision-making process, and agree with plan.   I discussed return precautions,  follow-up instructions, and discharged instructions with patient and/or family.   ___________________________________________   FINAL CLINICAL IMPRESSION(S) / ED DIAGNOSES   Final diagnoses:  Hypokalemia  Skin flushed              Note: This dictation was prepared with Dragon dictation. Any transcriptional errors that result from this process are unintentional   Lisa Roca, MD 01/08/16 [phone]

## 2016-01-08 NOTE — ED Notes (Signed)
Pt up to bathroom with assitance.

## 2016-01-10 DIAGNOSIS — D3502 Benign neoplasm of left adrenal gland: Secondary | ICD-10-CM | POA: Diagnosis not present

## 2016-01-10 DIAGNOSIS — E250 Congenital adrenogenital disorders associated with enzyme deficiency: Secondary | ICD-10-CM | POA: Diagnosis not present

## 2016-01-10 DIAGNOSIS — D1779 Benign lipomatous neoplasm of other sites: Secondary | ICD-10-CM | POA: Diagnosis not present

## 2016-01-10 DIAGNOSIS — E119 Type 2 diabetes mellitus without complications: Secondary | ICD-10-CM | POA: Diagnosis not present

## 2016-01-10 DIAGNOSIS — D3501 Benign neoplasm of right adrenal gland: Secondary | ICD-10-CM | POA: Diagnosis not present

## 2016-01-12 DIAGNOSIS — D3501 Benign neoplasm of right adrenal gland: Secondary | ICD-10-CM | POA: Diagnosis not present

## 2016-01-13 LAB — CULTURE, BLOOD (ROUTINE X 2)
Culture: NO GROWTH
Culture: NO GROWTH

## 2016-01-18 DIAGNOSIS — D1779 Benign lipomatous neoplasm of other sites: Secondary | ICD-10-CM | POA: Diagnosis not present

## 2016-03-06 DIAGNOSIS — E250 Congenital adrenogenital disorders associated with enzyme deficiency: Secondary | ICD-10-CM | POA: Diagnosis not present

## 2016-03-06 DIAGNOSIS — E119 Type 2 diabetes mellitus without complications: Secondary | ICD-10-CM | POA: Diagnosis not present

## 2016-03-13 DIAGNOSIS — E250 Congenital adrenogenital disorders associated with enzyme deficiency: Secondary | ICD-10-CM | POA: Diagnosis not present

## 2016-03-13 DIAGNOSIS — D3501 Benign neoplasm of right adrenal gland: Secondary | ICD-10-CM | POA: Diagnosis not present

## 2016-03-13 DIAGNOSIS — E119 Type 2 diabetes mellitus without complications: Secondary | ICD-10-CM | POA: Diagnosis not present

## 2016-03-13 DIAGNOSIS — D1779 Benign lipomatous neoplasm of other sites: Secondary | ICD-10-CM | POA: Diagnosis not present

## 2016-03-15 DIAGNOSIS — N912 Amenorrhea, unspecified: Secondary | ICD-10-CM | POA: Diagnosis not present

## 2016-04-15 DIAGNOSIS — Z7952 Long term (current) use of systemic steroids: Secondary | ICD-10-CM | POA: Diagnosis not present

## 2016-04-15 DIAGNOSIS — Z780 Asymptomatic menopausal state: Secondary | ICD-10-CM | POA: Diagnosis not present

## 2016-04-27 ENCOUNTER — Emergency Department
Admission: EM | Admit: 2016-04-27 | Discharge: 2016-04-27 | Disposition: A | Discharge status: Home / Self Care | Payer: BLUE CROSS/BLUE SHIELD | Attending: Emergency Medicine | Admitting: Emergency Medicine

## 2016-04-27 ENCOUNTER — Encounter: Payer: Self-pay | Admitting: Emergency Medicine

## 2016-04-27 DIAGNOSIS — E860 Dehydration: Secondary | ICD-10-CM | POA: Diagnosis not present

## 2016-04-27 DIAGNOSIS — R531 Weakness: Secondary | ICD-10-CM | POA: Insufficient documentation

## 2016-04-27 DIAGNOSIS — Z79899 Other long term (current) drug therapy: Secondary | ICD-10-CM | POA: Diagnosis not present

## 2016-04-27 LAB — COMPREHENSIVE METABOLIC PANEL
ALT: 34 U/L (ref 14–54)
AST: 25 U/L (ref 15–41)
Albumin: 4.5 g/dL (ref 3.5–5.0)
Alkaline Phosphatase: 81 U/L (ref 38–126)
Anion gap: 9 (ref 5–15)
BUN: 20 mg/dL (ref 6–20)
CO2: 24 mmol/L (ref 22–32)
Calcium: 9.4 mg/dL (ref 8.9–10.3)
Chloride: 102 mmol/L (ref 101–111)
Creatinine, Ser: 0.7 mg/dL (ref 0.44–1.00)
GFR calc Af Amer: >60 mL/min (ref >60)
GFR calc non Af Amer: >60 mL/min (ref >60)
Glucose, Bld: 183 mg/dL — ABNORMAL HIGH (ref 65–99)
Potassium: 3.8 mmol/L (ref 3.5–5.1)
Sodium: 135 mmol/L (ref 135–145)
Total Bilirubin: 0.6 mg/dL (ref 0.3–1.2)
Total Protein: 7.9 g/dL (ref 6.5–8.1)

## 2016-04-27 LAB — CORTISOL-AM, BLOOD: Cortisol - AM: 3.2 ug/dL — ABNORMAL LOW (ref 6.7–22.6)

## 2016-04-27 LAB — CBC
HCT: 42.1 % (ref 35.0–47.0)
Hemoglobin: 14.1 g/dL (ref 12.0–16.0)
MCH: 27.3 pg (ref 26.0–34.0)
MCHC: 33.6 g/dL (ref 32.0–36.0)
MCV: 81.3 fL (ref 80.0–100.0)
Platelets: 261 10*3/uL (ref 150–440)
RBC: 5.18 MIL/uL (ref 3.80–5.20)
RDW: 14.3 % (ref 11.5–14.5)
WBC: 13.9 10*3/uL — ABNORMAL HIGH (ref 3.6–11.0)

## 2016-04-27 LAB — MAGNESIUM: Magnesium: 1.8 mg/dL (ref 1.7–2.4)

## 2016-04-27 MED ORDER — SODIUM CHLORIDE 0.9 % IV BOLUS (SEPSIS)
1000.0000 mL | Freq: Once | INTRAVENOUS | Status: AC
  Administered 2016-04-27: 1000 mL via INTRAVENOUS

## 2016-04-27 NOTE — ED Provider Notes (Signed)
[facility] Emergency Department Provider Note   ____________________________________________   First MD Initiated Contact with Patient 04/27/16 1022     (approximate)  I have reviewed the triage vital signs and the nursing notes.   HISTORY  Chief Complaint Weakness    HPI Tina Carrillo is a 45 y.o. female presents for evaluation of feeling fatigued. Patient reports that for the last 6 months she's had periods where she will feel tired, and requires sodium chloride because of salt wasting associated with her adrenal insufficiency.  She reports that she is regular with taking her medicine, has not missed any doses, and took prednisone 5 mg last night, fludrocortisone this morning, and prednisone this morning.  She reports that she does not feel she is having a "crisis" but rather that she is likely just slightly dehydrated. She follows closely with Dr. Gabriel Carina of endocrinology here.  No pain. Felt a tingling feeling in both sides of her face over the last day, no weakness numbness or other concerns. No trouble speaking or headache. No fevers chills nausea or vomiting. No abdominal pain.   Past Medical History:  Diagnosis Date  . Adrenal benign tumor   . Adrenal hyperplasia [facility])     Patient Active Problem List   Diagnosis Date Noted  . Environmental and seasonal allergies 06/14/2015  . Congenital adrenal cortical hyperplasia ([place]) 08/16/2014    Past Surgical History:  Procedure Laterality Date  . CHOLECYSTECTOMY    . CLEFT PALATE REPAIR    . VAGINA RECONSTRUCTION SURGERY      Prior to Admission medications   Medication Sig Start Date End Date Taking? Authorizing Provider  dexamethasone (DECADRON) 0.5 MG tablet Take 0.5 mg by mouth daily.    Historical Provider, MD  dexamethasone (DECADRON) 0.5 MG tablet Take 1 mg by mouth daily.    Historical Provider, MD  dicyclomine (BENTYL) 10 MG capsule Take 1 capsule by mouth 3 (three) times daily.  06/01/15 05/31/16  Historical Provider, MD  fludrocortisone (FLORINEF) 0.1 MG tablet Take 0.1 mg by mouth daily.    Historical Provider, MD  HYDROcodone-acetaminophen (NORCO/VICODIN) 5-325 MG per tablet Take 1 tablet by mouth every 4 (four) hours as needed. 03/23/15   Victorino Dike, FNP    Allergies Morphine and related; Nucynta [tapentadol]; and Oxycodone  Family History  Problem Relation Age of Onset  . Ovarian cancer Mother   . Uterine cancer Sister     Social History Social History  Substance Use Topics  . Smoking status: Never Smoker  . Smokeless tobacco: Not on file  . Alcohol use No    Review of Systems Constitutional: No fever/chills Eyes: No visual changes. ENT: No sore throat. Cardiovascular: Denies chest pain. Respiratory: Denies shortness of breath. Gastrointestinal: No abdominal pain.  No nausea, no vomiting.  No diarrhea.  No constipation. Genitourinary: Negative for dysuria. Musculoskeletal: Negative for back pain. Skin: Negative for rash. Neurological: Negative for headaches, focal weakness or numbness.  10-point ROS otherwise negative.  ____________________________________________   PHYSICAL EXAM:  VITAL SIGNS: ED Triage Vitals [04/27/16 1011]  Enc Vitals Group     BP (!) 153/88     Pulse Rate (!) 106     Resp 20     Temp 97.9 F (36.6 C)     Temp src      SpO2 100 %     Weight 188 lb (85.3 kg)     Height 4' 11"  (1.499 m)     Head Circumference  Peak Flow      Pain Score      Pain Loc      Pain Edu?      Excl. in [place]?     Constitutional: Alert and oriented. Well appearing and in no acute distress.Sitting up, very pleasant. Eyes: Conjunctivae are normal. PERRL. EOMI. Head: Atraumatic. Nose: No congestion/rhinnorhea. Mouth/Throat: Mucous membranes are slightly dry.  Oropharynx non-erythematous. Neck: No stridor.   Cardiovascular: Normal rate, regular rhythm. Heart rate 90. Grossly normal heart sounds.  Good peripheral  circulation. Respiratory: Normal respiratory effort.  No retractions. Lungs CTAB. Gastrointestinal: Soft and nontender. No distention. No abdominal bruits. No CVA tenderness. Musculoskeletal: No lower extremity tenderness nor edema.  Neurologic:  Normal speech and language. No gross focal neurologic deficits are appreciated. No pronator drift. Normal strength and sensation all extremities. Normal cranial nerve exam and extraocular movements. Skin:  Skin is warm, dry and intact. No rash noted. Psychiatric: Mood and affect are normal. Speech and behavior are normal.  ____________________________________________   LABS (all labs ordered are listed, but only abnormal results are displayed)  Labs Reviewed  CBC - Abnormal; Notable for the following:       Result Value   WBC 13.9 (*)    All other components within normal limits  COMPREHENSIVE METABOLIC PANEL - Abnormal; Notable for the following:    Glucose, Bld 183 (*)    All other components within normal limits  MAGNESIUM  CORTISOL-AM, BLOOD   ____________________________________________  EKG  Reviewed and interpreted by me at 10:40 AM Heart rate 90 QRS 90 QTc 460 Normal sinus rhythm, no evidence of ischemic abnormality. ____________________________________________  RADIOLOGY   ____________________________________________   PROCEDURES  Procedure(s) performed: None  Procedures  Critical Care performed: No  ____________________________________________   INITIAL IMPRESSION / ASSESSMENT AND PLAN / ED COURSE  Pertinent labs & imaging results that were available during my care of the patient were reviewed by me and considered in my medical decision making (see chart for details).  Patient transfer evaluation of fatigue. She reports adrenal insufficiency, but no evidence by clinical history or exam to suggest acute crisis. 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  She is taking her medications appropriately.    Clinical Course   ----------------------------------------- 12:13 PM on 04/27/2016 -----------------------------------------  Patient reports feeling improved. She has been ambulating back and forth, reports that she feels much better. Again, find no evidence of or acute adrenal insufficiency are still crisis today. We have hydrated her, she feels much better, and we have discussed careful follow-up with her endocrinologist whom she will call Monday, and very close return precautions. She'll continue to take her steroids as previously prescribed  ____________________________________________   FINAL CLINICAL IMPRESSION(S) / ED DIAGNOSES  Final diagnoses:  Weakness  Dehydration      NEW MEDICATIONS STARTED DURING THIS VISIT:  New Prescriptions   No medications on file     Note:  This document was prepared using Dragon voice recognition software and may include unintentional dictation errors.     Delman Kitten, MD 04/27/16 [phone]

## 2016-04-27 NOTE — ED Triage Notes (Addendum)
States felt weak at work approx 30 minutes ago. History of  adrenal hyperplasia with history of hyponatremia. States also has facial numbness but is bilateral. Grips equal. Speech clear.

## 2016-04-27 NOTE — Discharge Instructions (Signed)
°  Please return to the emergency room right away if you are to develop a fever, severe nausea, your weakness feels or becomes severe or worsens, you are unable to keep food down, begin vomiting any dark or bloody fluid, you develop any dark or bloody stools, feel dehydrated, or other new concerns or symptoms arise.  Continue your steroids and call Dr. Joycie Peek office Monday Morning for follow-up.

## 2016-04-27 NOTE — ED Notes (Signed)
Patient offered medication for nausea. Patient declines medication at this time.

## 2016-04-27 NOTE — ED Notes (Signed)
Patient c/o generalized weakness that started about 1.5 hours ago. Patient states that she was at work when she started feeling very weak and nauseated. Patient states that she is also having some tingling in her left arm. Patient has no focal deficits at time of assessment.

## 2016-05-08 ENCOUNTER — Encounter: Payer: Self-pay | Admitting: Internal Medicine

## 2016-05-08 ENCOUNTER — Ambulatory Visit (INDEPENDENT_AMBULATORY_CARE_PROVIDER_SITE_OTHER): Payer: BLUE CROSS/BLUE SHIELD | Admitting: Internal Medicine

## 2016-05-08 ENCOUNTER — Other Ambulatory Visit: Payer: Self-pay | Admitting: Internal Medicine

## 2016-05-08 VITALS — BP 122/80 | HR 112 | Temp 98.6°F | Resp 16 | Ht 59.0 in | Wt 191.0 lb

## 2016-05-08 DIAGNOSIS — E250 Congenital adrenogenital disorders associated with enzyme deficiency: Secondary | ICD-10-CM

## 2016-05-08 DIAGNOSIS — E278 Other specified disorders of adrenal gland: Secondary | ICD-10-CM | POA: Insufficient documentation

## 2016-05-08 DIAGNOSIS — J019 Acute sinusitis, unspecified: Secondary | ICD-10-CM | POA: Diagnosis not present

## 2016-05-08 DIAGNOSIS — E259 Adrenogenital disorder, unspecified: Secondary | ICD-10-CM

## 2016-05-08 MED ORDER — AMOXICILLIN 875 MG PO TABS: 875.0000 mg | ORAL_TABLET | Freq: Two times a day (BID) | ORAL | 0 refills | Status: DC

## 2016-05-08 NOTE — Progress Notes (Signed)
Date:  05/08/2016   Name:  Tina Carrillo   DOB:  May 11, 1971   MRN:  [id number]   Chief Complaint: Sinus Problem (headache and sinus pressure x 2 days denies fever and NVD. ) Sinus Problem  This is a new problem. The current episode started in the past 7 days. The problem has been gradually worsening since onset. There has been no fever. She is experiencing no pain. Associated symptoms include shortness of breath, sinus pressure and a sore throat. Pertinent negatives include no chills. Past treatments include nothing.   CAH - she has less abdominal distention since changing to prednisone from decadron.  However, she is not sure that she feels as well.  She plans to follow up with endocrinology in the near future.  Will continue current medications for now.  Review of Systems  Constitutional: Positive for fatigue. Negative for chills and fever.  HENT: Positive for sinus pressure and sore throat.   Respiratory: Positive for shortness of breath.   Gastrointestinal: Negative for abdominal pain, constipation and diarrhea.  Endocrine: Negative for polydipsia and polyuria.  Musculoskeletal: Negative for arthralgias.  Skin: Negative for rash.  Neurological: Positive for weakness and light-headedness. Negative for dizziness, tremors and syncope.  Psychiatric/Behavioral: Negative for sleep disturbance.    Patient Active Problem List   Diagnosis Date Noted  . Adrenal mass ([place]) 05/08/2016  . Type 2 diabetes mellitus without complication, without long-term current use of insulin ([place]) 10/17/2015  . Environmental and seasonal allergies 06/14/2015  . CAH (congenital adrenal hyperplasia) 08/16/2014    Prior to Admission medications   Medication Sig Start Date End Date Taking? Authorizing Provider  fludrocortisone (FLORINEF) 0.1 MG tablet Take 0.1 mg by mouth daily.    Historical Provider, MD    Allergies  Allergen Reactions  . Morphine And Related Nausea And Vomiting  . Nucynta  [Tapentadol] Nausea And Vomiting  . Oxycodone Itching    Past Surgical History:  Procedure Laterality Date  . CHOLECYSTECTOMY    . CLEFT PALATE REPAIR    . VAGINA RECONSTRUCTION SURGERY      Social History  Substance Use Topics  . Smoking status: Never Smoker  . Smokeless tobacco: Never Used  . Alcohol use No     Medication list has been reviewed and updated.   Physical Exam  Constitutional: She is oriented to person, place, and time. She appears well-developed and well-nourished.  HENT:  Right Ear: External ear and ear canal normal. Tympanic membrane is not erythematous and not retracted.  Left Ear: External ear and ear canal normal. Tympanic membrane is not erythematous and not retracted.  Nose: Right sinus exhibits maxillary sinus tenderness and frontal sinus tenderness. Left sinus exhibits maxillary sinus tenderness and frontal sinus tenderness.  Mouth/Throat: Uvula is midline and mucous membranes are normal. No oral lesions. Posterior oropharyngeal erythema present. No oropharyngeal exudate.  Cardiovascular: Normal rate, regular rhythm and normal heart sounds.   Pulmonary/Chest: Breath sounds normal. She has no wheezes. She has no rales.  Lymphadenopathy:    She has no cervical adenopathy.  Neurological: She is alert and oriented to person, place, and time.    BP 122/80   Pulse (!) 112   Temp 98.6 F (37 C) (Oral)   Resp 16   Ht 4' 11"  (1.499 m)   Wt 191 lb (86.6 kg)   SpO2 98%   BMI 38.58 kg/m   Assessment and Plan: 1. Acute sinusitis, recurrence not specified, unspecified location Continue saline flushes as  needed Note to be out of work yesterday and today - amoxicillin (AMOXIL) 875 MG tablet; Take 1 tablet (875 mg total) by mouth 2 (two) times daily.  Dispense: 20 tablet; Refill: 0  2. CAH (congenital adrenal hyperplasia) Follow up with Dr. Gabriel Carina May double prednisone dose for 3-4 days   Halina Maidens, MD [facility]  05/08/2016

## 2016-06-20 ENCOUNTER — Other Ambulatory Visit: Payer: Self-pay | Admitting: Internal Medicine

## 2016-06-20 DIAGNOSIS — E279 Disorder of adrenal gland, unspecified: Principal | ICD-10-CM

## 2016-06-20 DIAGNOSIS — E278 Other specified disorders of adrenal gland: Secondary | ICD-10-CM

## 2016-06-28 ENCOUNTER — Ambulatory Visit (INDEPENDENT_AMBULATORY_CARE_PROVIDER_SITE_OTHER): Payer: BLUE CROSS/BLUE SHIELD | Admitting: Internal Medicine

## 2016-06-28 ENCOUNTER — Encounter: Payer: Self-pay | Admitting: Internal Medicine

## 2016-06-28 VITALS — BP 128/78 | HR 84 | Temp 98.1°F | Resp 16 | Ht 59.0 in | Wt 196.0 lb

## 2016-06-28 DIAGNOSIS — J40 Bronchitis, not specified as acute or chronic: Secondary | ICD-10-CM

## 2016-06-28 DIAGNOSIS — E278 Other specified disorders of adrenal gland: Secondary | ICD-10-CM

## 2016-06-28 DIAGNOSIS — E279 Disorder of adrenal gland, unspecified: Secondary | ICD-10-CM | POA: Diagnosis not present

## 2016-06-28 DIAGNOSIS — E250 Congenital adrenogenital disorders associated with enzyme deficiency: Secondary | ICD-10-CM | POA: Diagnosis not present

## 2016-06-28 DIAGNOSIS — B3731 Acute candidiasis of vulva and vagina: Secondary | ICD-10-CM

## 2016-06-28 DIAGNOSIS — B373 Candidiasis of vulva and vagina: Secondary | ICD-10-CM

## 2016-06-28 MED ORDER — FLUCONAZOLE 100 MG PO TABS: 100.0000 mg | ORAL_TABLET | Freq: Every day | ORAL | 0 refills | Status: DC

## 2016-06-28 MED ORDER — AMOXICILLIN 875 MG PO TABS
875.0000 mg | ORAL_TABLET | Freq: Two times a day (BID) | ORAL | 0 refills | Status: DC
Start: 2016-06-28 — End: 2016-12-18

## 2016-06-28 MED ORDER — GUAIFENESIN-CODEINE 100-10 MG/5ML PO SYRP: 5.0000 mL | ORAL_SOLUTION | Freq: Three times a day (TID) | ORAL | 0 refills | Status: DC | PRN

## 2016-06-28 NOTE — Patient Instructions (Signed)
Allegra or Claritin - take one a day for post nasal drainage

## 2016-06-28 NOTE — Progress Notes (Signed)
Date:  06/28/2016   Name:  Tina Carrillo   DOB:  10-27-1970   MRN:  [id number]   Chief Complaint: Cough (1 week tried Amox left over and alsoincreased steroid to 46m morning and 10 at night. ) and Nasal Congestion Cough  This is a new problem. The current episode started in the past 7 days. The problem has been gradually worsening. The cough is productive of purulent sputum. Associated symptoms include headaches, nasal congestion, postnasal drip, rhinorrhea, a sore throat and shortness of breath. Pertinent negatives include no chest pain, chills, fever or wheezing. The symptoms are aggravated by exercise and lying down. She has tried nothing for the symptoms.   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  [facility]-2 years.  Recurrent yeast vaginitis - pt requests diflucan for sx stemming from recent antibiotic therapy  Review of Systems  Constitutional: Positive for fatigue. Negative for chills, fever and unexpected weight change.  HENT: Positive for postnasal drip, rhinorrhea, sinus pressure and sore throat. Negative for congestion.   Respiratory: Positive for cough and shortness of breath. Negative for chest tightness and wheezing.   Cardiovascular: Negative for chest pain and palpitations.  Gastrointestinal: Negative for abdominal pain, diarrhea and vomiting.  Genitourinary: Positive for vaginal discharge (itching).  Neurological: Positive for headaches.    Patient Active Problem List   Diagnosis Date Noted  . Ad[place]al [place] ([place] 05/08/2016  . Type 2 diabetes mellitus without complication, without long-term current use of insulin [place] 10/17/2015  . Environmental and seasonal allergies 06/14/2015  . CAH (congenital adrenal hyperplasia) 08/16/2014    Prior to Admission medications   Medication Sig Start Date End Date Taking? Authorizing Provider  fludrocortisone (FLORINEF) 0.1 MG  tablet Take 0.1 mg by mouth daily.   Yes Historical Provider, MD  predniSONE (DELTASONE) 10 MG tablet Take 10 mg by mouth 2 (two) times daily with a meal.   Yes Historical Provider, MD    Allergies  Allergen Reactions  . Morphine And Related Nausea And Vomiting  . Nucynta [Tapentadol] Nausea And Vomiting  . Oxycodone Itching    Past Surgical History:  Procedure Laterality Date  . CHOLECYSTECTOMY    . CLEFT PALATE REPAIR    . VAGINA RECONSTRUCTION SURGERY      Social History  Substance Use Topics  . Smoking status: Never Smoker  . Smokeless tobacco: Never Used  . Alcohol use No     Medication list has been reviewed and updated.   Physical Exam  Constitutional: She appears well-developed and well-nourished.  HENT:  Right Ear: Ear canal normal. Tympanic membrane is perforated.  Left Ear: Ear canal normal. Tympanic membrane is perforated.  Nose: Right sinus exhibits maxillary sinus tenderness. Right sinus exhibits no frontal sinus tenderness. Left sinus exhibits maxillary sinus tenderness. Left sinus exhibits no frontal sinus tenderness.  Mouth/Throat: Posterior oropharyngeal erythema present.  Cardiovascular: Normal rate, regular rhythm and normal heart sounds.   Pulmonary/Chest: Effort normal. She has decreased breath sounds. She has no wheezes. She has no rhonchi.  Psychiatric: She has a normal mood and affect. Her speech is normal.    BP 128/78   Pulse 84   Temp 98.1 F (36.7 C) (Oral)   Resp 16   Ht 4' 11"  (1.499 m)   Wt 196 lb (88.9 kg)   SpO2 98%   BMI 39.59 kg/m   Assessment and Plan: 1. Bronchitis  Additional amoxicillin and cough syrup claritin or allegra for PND - amoxicillin (AMOXIL) 875 MG tablet; Take 1 tablet (875 mg total) by mouth 2 (two) times daily.  Dispense: 20 tablet; Refill: 0 - guaiFENesin-codeine (ROBITUSSIN AC) 100-10 MG/5ML syrup; Take 5 mLs by mouth 3 (three) times daily as needed for cough.  Dispense: 150 mL; Refill: 0  2. Yeast  vaginitis - fluconazole (DIFLUCAN) 100 MG tablet; Take 1 tablet (100 mg total) by mouth daily.  Dispense: 3 tablet; Refill: 0  3. Adrenal mass ([place]) Benign by imaging and neurohormonal studies Follow up MRI 1 year  4. Congenital adrenal hyperplasia ([place]) Supplemented; followed by Endocrinology   Halina Maidens, MD [facility]  06/28/2016

## 2016-07-03 ENCOUNTER — Encounter: Payer: Self-pay | Admitting: Internal Medicine

## 2016-07-03 ENCOUNTER — Ambulatory Visit (INDEPENDENT_AMBULATORY_CARE_PROVIDER_SITE_OTHER): Payer: BLUE CROSS/BLUE SHIELD | Admitting: Internal Medicine

## 2016-07-03 ENCOUNTER — Telehealth: Payer: Self-pay

## 2016-07-03 VITALS — BP 124/82 | HR 74 | Resp 16 | Ht 59.0 in | Wt 194.0 lb

## 2016-07-03 DIAGNOSIS — H60503 Unspecified acute noninfective otitis externa, bilateral: Secondary | ICD-10-CM

## 2016-07-03 MED ORDER — CIPROFLOXACIN HCL 0.2 % OT SOLN: 0.2000 mL | Freq: Two times a day (BID) | OTIC | 0 refills | Status: DC

## 2016-07-03 NOTE — Progress Notes (Signed)
    Date:  07/03/2016   Name:  Tina Carrillo   DOB:  October 11, 1970   MRN:  [id number]   Chief Complaint: Ear Pain (R ear hurting more than L x 2 days post nasal congestion. )  Seen last week for bronchitis and sinusitis.  Taking amox bid and those sx are improving.  She is mostly having ear discomfort and fullness.  No drainage.  No fever.  Feels like swimmer's ear.  Review of Systems  Constitutional: Negative for chills, fatigue and fever.  HENT: Positive for congestion, ear pain and sinus pain. Negative for ear discharge.   Respiratory: Negative for chest tightness, shortness of breath and wheezing.   Cardiovascular: Negative for chest pain, palpitations and leg swelling.    Patient Active Problem List   Diagnosis Date Noted  . Adrenal mass ([place]) 05/08/2016  . Type 2 diabetes mellitus without complication, without long-term current use of insulin ([place]) 10/17/2015  . Environmental and seasonal allergies 06/14/2015  . Congenital adrenal hyperplasia ([place]) 08/16/2014    Prior to Admission medications   Medication Sig Start Date End Date Taking? Authorizing Provider  amoxicillin (AMOXIL) 875 MG tablet Take 1 tablet (875 mg total) by mouth 2 (two) times daily. 06/28/16  Yes Glean Hess, MD  fludrocortisone (FLORINEF) 0.1 MG tablet Take 0.1 mg by mouth daily.   Yes Historical Provider, MD  guaiFENesin-codeine (ROBITUSSIN AC) 100-10 MG/5ML syrup Take 5 mLs by mouth 3 (three) times daily as needed for cough. 06/28/16  Yes Glean Hess, MD  predniSONE (DELTASONE) 10 MG tablet Take 10 mg by mouth 2 (two) times daily with a meal.   Yes Historical Provider, MD    Allergies  Allergen Reactions  . Morphine And Related Nausea And Vomiting  . Nucynta [Tapentadol] Nausea And Vomiting  . Oxycodone Itching    Past Surgical History:  Procedure Laterality Date  . CHOLECYSTECTOMY    . CLEFT PALATE REPAIR    . VAGINA RECONSTRUCTION SURGERY      Social History  Substance Use Topics  .  Smoking status: Never Smoker  . Smokeless tobacco: Never Used  . Alcohol use No     Medication list has been reviewed and updated.   Physical Exam  Constitutional: She is oriented to person, place, and time. She appears well-developed. No distress.  HENT:  Head: Normocephalic and atraumatic.  Right Ear: Tympanic membrane is scarred, perforated and erythematous.  Left Ear: Tympanic membrane is scarred, perforated and erythematous.  Cardiovascular: Normal rate, regular rhythm and normal heart sounds.   Pulmonary/Chest: Effort normal and breath sounds normal. No respiratory distress. She has no decreased breath sounds. She has no wheezes.  Musculoskeletal: Normal range of motion.  Neurological: She is alert and oriented to person, place, and time.  Skin: Skin is warm and dry. No rash noted.  Psychiatric: She has a normal mood and affect. Her behavior is normal. Thought content normal.  Nursing note and vitals reviewed.   BP 124/82   Pulse 74   Resp 16   Ht 4' 11"  (1.499 m)   Wt 194 lb (88 kg)   SpO2 98%   BMI 39.18 kg/m   Assessment and Plan: 1. Acute otitis externa of both ears, unspecified type - Ciprofloxacin HCl 0.2 % otic solution; Place 0.2 mLs into both ears 2 (two) times daily.  Dispense: 1 vial; Refill: 0   Halina Maidens, MD [facility]  07/03/2016

## 2016-07-03 NOTE — Telephone Encounter (Signed)
Seen last week and now has Ear pain and wants Ear drop for L ear. Advised OV.

## 2016-07-08 DIAGNOSIS — D1779 Benign lipomatous neoplasm of other sites: Secondary | ICD-10-CM | POA: Diagnosis not present

## 2016-07-08 DIAGNOSIS — E250 Congenital adrenogenital disorders associated with enzyme deficiency: Secondary | ICD-10-CM | POA: Diagnosis not present

## 2016-07-08 DIAGNOSIS — E119 Type 2 diabetes mellitus without complications: Secondary | ICD-10-CM | POA: Diagnosis not present

## 2016-07-08 DIAGNOSIS — D3501 Benign neoplasm of right adrenal gland: Secondary | ICD-10-CM | POA: Diagnosis not present

## 2016-07-09 ENCOUNTER — Other Ambulatory Visit: Payer: Self-pay | Admitting: Internal Medicine

## 2016-07-09 ENCOUNTER — Telehealth: Payer: Self-pay | Admitting: Internal Medicine

## 2016-07-09 DIAGNOSIS — H9203 Otalgia, bilateral: Secondary | ICD-10-CM

## 2016-07-09 NOTE — Telephone Encounter (Signed)
Patient was seen last week for ear pain - ear drops have not helped and would like a referral to ENT

## 2016-07-15 DIAGNOSIS — H6983 Other specified disorders of Eustachian tube, bilateral: Secondary | ICD-10-CM | POA: Diagnosis not present

## 2016-07-16 DIAGNOSIS — E250 Congenital adrenogenital disorders associated with enzyme deficiency: Secondary | ICD-10-CM | POA: Diagnosis not present

## 2016-07-16 DIAGNOSIS — D1779 Benign lipomatous neoplasm of other sites: Secondary | ICD-10-CM | POA: Diagnosis not present

## 2016-07-16 DIAGNOSIS — D3501 Benign neoplasm of right adrenal gland: Secondary | ICD-10-CM | POA: Diagnosis not present

## 2016-07-16 DIAGNOSIS — E119 Type 2 diabetes mellitus without complications: Secondary | ICD-10-CM | POA: Diagnosis not present

## 2016-11-08 DIAGNOSIS — E250 Congenital adrenogenital disorders associated with enzyme deficiency: Secondary | ICD-10-CM | POA: Diagnosis not present

## 2016-11-08 DIAGNOSIS — E119 Type 2 diabetes mellitus without complications: Secondary | ICD-10-CM | POA: Diagnosis not present

## 2016-11-15 DIAGNOSIS — D3501 Benign neoplasm of right adrenal gland: Secondary | ICD-10-CM | POA: Diagnosis not present

## 2016-11-15 DIAGNOSIS — E119 Type 2 diabetes mellitus without complications: Secondary | ICD-10-CM | POA: Diagnosis not present

## 2016-11-15 DIAGNOSIS — E250 Congenital adrenogenital disorders associated with enzyme deficiency: Secondary | ICD-10-CM | POA: Diagnosis not present

## 2016-11-15 DIAGNOSIS — N912 Amenorrhea, unspecified: Secondary | ICD-10-CM | POA: Diagnosis not present

## 2016-12-02 DIAGNOSIS — K529 Noninfective gastroenteritis and colitis, unspecified: Secondary | ICD-10-CM | POA: Diagnosis not present

## 2016-12-03 ENCOUNTER — Ambulatory Visit: Payer: BLUE CROSS/BLUE SHIELD | Admitting: Internal Medicine

## 2016-12-18 ENCOUNTER — Encounter: Payer: Self-pay | Admitting: Internal Medicine

## 2016-12-18 ENCOUNTER — Ambulatory Visit (INDEPENDENT_AMBULATORY_CARE_PROVIDER_SITE_OTHER): Payer: BLUE CROSS/BLUE SHIELD | Admitting: Internal Medicine

## 2016-12-18 VITALS — BP 132/80 | HR 98 | Ht 59.0 in | Wt 187.4 lb

## 2016-12-18 DIAGNOSIS — M778 Other enthesopathies, not elsewhere classified: Secondary | ICD-10-CM | POA: Diagnosis not present

## 2016-12-18 MED ORDER — DICLOFENAC SODIUM 1 % TD GEL: 2.0000 g | Freq: Four times a day (QID) | TRANSDERMAL | 3 refills | Status: DC

## 2016-12-18 NOTE — Progress Notes (Signed)
Date:  12/18/2016   Name:  Tina Carrillo   DOB:  08-09-1971   MRN:  [id number]   Chief Complaint: Elbow Pain (R) Elbow. Started 2 weeks ago. "I do repetitive movement at work, lifting " and is starting to cause problem. Hurts in front of elbow and directly on elbow. Difficulty with gripping items in hands. ) HPI Intermittent pain in right elbow.  Hurts more to grip which she does repetitively at work.  She has tried, heat, ice and tylenol with no benefit. She takes prednisone 20 mg per day for CAH.   Review of Systems  Constitutional: Negative for chills, fatigue and fever.  Respiratory: Negative for chest tightness and shortness of breath.   Cardiovascular: Negative for chest pain.  Musculoskeletal: Positive for myalgias. Negative for joint swelling.  Neurological: Negative for weakness and numbness.    Patient Active Problem List   Diagnosis Date Noted  . Adrenal mass ([place]) 05/08/2016  . Type 2 diabetes mellitus without complication, without long-term current use of insulin ([place]) 10/17/2015  . Environmental and seasonal allergies 06/14/2015  . Congenital adrenal hyperplasia ([place]) 08/16/2014    Prior to Admission medications   Medication Sig Start Date End Date Taking? Authorizing Provider  acetaminophen (TYLENOL) 500 MG tablet Take 500 mg by mouth every 6 (six) hours as needed.   Yes Historical Provider, MD  fludrocortisone (FLORINEF) 0.1 MG tablet Take 0.1 mg by mouth daily.   Yes Historical Provider, MD  predniSONE (DELTASONE) 10 MG tablet Take 10 mg by mouth 2 (two) times daily with a meal.   Yes Historical Provider, MD    Allergies  Allergen Reactions  . Benzodiazepines Hives  . Tramadol Nausea And Vomiting  . Morphine And Related Nausea And Vomiting  . Nucynta [Tapentadol] Nausea And Vomiting  . Oxycodone Itching    Past Surgical History:  Procedure Laterality Date  . CHOLECYSTECTOMY    . CLEFT PALATE REPAIR    . VAGINA RECONSTRUCTION SURGERY      Social  History  Substance Use Topics  . Smoking status: Never Smoker  . Smokeless tobacco: Never Used  . Alcohol use No     Medication list has been reviewed and updated.   Physical Exam  Constitutional: She is oriented to person, place, and time. She appears well-developed. No distress.  HENT:  Head: Normocephalic and atraumatic.  Cardiovascular: Normal rate, regular rhythm and normal heart sounds.   Pulmonary/Chest: Effort normal and breath sounds normal. No respiratory distress. She has no wheezes.  Musculoskeletal: Normal range of motion.       Arms: Neurological: She is alert and oriented to person, place, and time. She has normal strength. No sensory deficit.  Skin: Skin is warm and dry. No rash noted.  Psychiatric: She has a normal mood and affect. Her behavior is normal. Thought content normal.  Nursing note and vitals reviewed.   BP 132/80 (BP Location: Right Arm, Patient Position: Sitting, Cuff Size: Normal)   Pulse 98   Ht 4' 11"  (1.499 m)   Wt 187 lb 6.4 oz (85 kg)   SpO2 97%   BMI 37.85 kg/m   Assessment and Plan: 1. Right elbow tendonitis Recommend tennis elbow strap at work Continue ice or heat   Meds ordered this encounter  Medications  . diclofenac sodium (VOLTAREN) 1 % GEL    Sig: Apply 2 g topically 4 (four) times daily.    Dispense:  100 g    Refill:  3  Halina Maidens, MD [facility]  12/18/2016

## 2017-02-16 ENCOUNTER — Encounter: Payer: Self-pay | Admitting: Gynecology

## 2017-02-16 ENCOUNTER — Ambulatory Visit
Admission: EM | Admit: 2017-02-16 | Discharge: 2017-02-16 | Disposition: A | Discharge status: Home / Self Care | Payer: BLUE CROSS/BLUE SHIELD | Attending: Orthopedic Surgery | Admitting: Orthopedic Surgery

## 2017-02-16 DIAGNOSIS — M25562 Pain in left knee: Secondary | ICD-10-CM

## 2017-02-16 MED ORDER — IBUPROFEN 600 MG PO TABS: 600.0000 mg | ORAL_TABLET | Freq: Four times a day (QID) | ORAL | 0 refills | Status: DC | PRN

## 2017-02-16 MED ORDER — IBUPROFEN 800 MG PO TABS: 800.0000 mg | ORAL_TABLET | Freq: Three times a day (TID) | ORAL | 0 refills | Status: DC | PRN

## 2017-02-16 NOTE — Discharge Instructions (Signed)
Please take oral anti-inflammatory medication as needed for pain. Please take medication with food. Rest ice and elevate the knee. Follow-up with orthopedics for x-rays of the left knee.

## 2017-02-16 NOTE — ED Provider Notes (Signed)
CSN: [id number]     Arrival date & time 02/16/17  1000 History   First MD Initiated Contact with Patient 02/16/17 1053     Chief Complaint  Patient presents with  . Knee Pain   (Consider location/radiation/quality/duration/timing/severity/associated sxs/prior Treatment) HPI  46 year old female presents to the urgent care facility for evaluation of left knee pain. The pain began 2 days ago. She denies any trauma or injury. Patient states 2 days ago she was pushing objects on pallets at work, denies any significant pain or discomfort at the time that that night developed aching pain to the lateral joint line of the left knee. She denies any catching clicking popping or swelling. She's had mild improvement with Tylenol and ibuprofen, only taking 400 mg of ibuprofen a day. She tolerates ibuprofen well denies any GI distress. Her pain is 5 out of 10 and increased with standing and walking as well as at nighttime she describes an ache to the lateral joint line. She denies any numbness tingling burning catching clicking or buckling sensation throughout the left lower extremity.  Past Medical History:  Diagnosis Date  . Adrenal benign tumor   . Adrenal hyperplasia [facility])    Past Surgical History:  Procedure Laterality Date  . CHOLECYSTECTOMY    . CLEFT PALATE REPAIR    . VAGINA RECONSTRUCTION SURGERY     Family History  Problem Relation Age of Onset  . Ovarian cancer Mother   . Uterine cancer Sister    Social History  Substance Use Topics  . Smoking status: Never Smoker  . Smokeless tobacco: Never Used  . Alcohol use No   OB History    No data available     Review of Systems  Constitutional: Negative for fever.  Respiratory: Negative for shortness of breath.   Cardiovascular: Negative for chest pain.  Musculoskeletal: Positive for arthralgias. Negative for back pain, gait problem, joint swelling, myalgias, neck pain and neck stiffness.  Skin: Negative for rash and wound.   Neurological: Negative for weakness.    Allergies  Benzodiazepines; Tramadol; Morphine and related; Nucynta [tapentadol]; and Oxycodone  Home Medications   Prior to Admission medications   Medication Sig Start Date End Date Taking? Authorizing Provider  acetaminophen (TYLENOL) 500 MG tablet Take 500 mg by mouth every 6 (six) hours as needed.   Yes [provider]  fludrocortisone (FLORINEF) 0.1 MG tablet Take 0.1 mg by mouth daily.   Yes [provider]  predniSONE (DELTASONE) 10 MG tablet Take 10 mg by mouth 2 (two) times daily with a meal.   Yes [provider]  diclofenac sodium (VOLTAREN) 1 % GEL Apply 2 g topically 4 (four) times daily. 12/18/16   Glean Hess, MD  ibuprofen (ADVIL,MOTRIN) 800 MG tablet Take 1 tablet (800 mg total) by mouth every 8 (eight) hours as needed. 02/16/17   Duanne Guess, PA-C   Meds Ordered and Administered this Visit  Medications - No data to display  BP 138/83 (BP Location: Left Arm)   Pulse 85   Temp 98 F (36.7 C) (Oral)   Resp 18   Ht 4' 11"  (1.499 m)   Wt 170 lb (77.1 kg)   SpO2 100%   BMI 34.34 kg/m  No data found.   Physical Exam  Constitutional: She appears well-developed and well-nourished.  Patient ambulates with no antalgic component.  HENT:  Head: Atraumatic.  Eyes: Conjunctivae are normal.  Neck: Normal range of motion.  Cardiovascular: Normal rate.   Pulmonary/Chest:  Effort normal. No respiratory distress.  Musculoskeletal:  Left Lower Extremities: Examination of the left lower extremity reveals no bony abnormality, no edema, no effusion and no ecchymosis.  There is no valgus or varus abnormality.  The patient is minimally along the lateral joint line, and is non-tender along the medial joint line.  The patient has full knee flexion and extension. There is no discomfort with range of motion exercises.  The patient has a negative rotational Mcmurray test.  There is no retropatellar discomfort.   The patient has a negative patella stretch test.  The patient has a negative varus stress test and a negative valgus stress test, in looking for stability.  The patient has a negative Lachman's test.  Vascular: The patient has a negative Bevelyn Buckles' test bilaterally.  The patient had a normal dorsalis pedis and posterior tibial pulse.  There is normal skin warmth.  There is normal capillary refill bilaterally.    Neurologic: The patient has a negative straight leg raise.  The patient has normal muscle strength testing for the quadriceps, calves, ankle dorsiflexion, ankle plantarflexion, and extensor hallicus longus.  The patient has sensation that is intact to light touch.  The deep tendon reflexes are normal at the    Urgent Care Course     Procedures (including critical care time)  Labs Review Labs Reviewed - No data to display  Imaging Review No results found.   Visual Acuity Review  Right Eye Distance:   Left Eye Distance:   Bilateral Distance:    Right Eye Near:   Left Eye Near:    Bilateral Near:         MDM   1. Acute pain of left knee    46 year old female with left knee pain 2 days. No trauma or injury. Patient developed aching pain along the lateral joint line the night of increased activity at work. Exam is unremarkable except for mild joint line tenderness to the lateral joint line. History, exam consistent with possible osteoarthritis. No signs of internal derangement or ligamentous injury. Offered x-rays today to the evaluate severity of possible osteoarthritis, patient requested to hold off on x-rays and would like to see orthopedics. Patient will continue Tylenol and ibuprofen as needed. She requests work today. Patient educated on signs and symptoms return to the clinic for.    Duanne Guess, [place] 02/16/17 1111

## 2017-02-16 NOTE — ED Triage Notes (Signed)
Per patient x 2 days ago left knee pain. Patient deny any injury to her knee.

## 2017-03-17 DIAGNOSIS — E250 Congenital adrenogenital disorders associated with enzyme deficiency: Secondary | ICD-10-CM | POA: Diagnosis not present

## 2017-03-17 DIAGNOSIS — E119 Type 2 diabetes mellitus without complications: Secondary | ICD-10-CM | POA: Diagnosis not present

## 2017-03-23 ENCOUNTER — Ambulatory Visit
Admission: EM | Admit: 2017-03-23 | Discharge: 2017-03-23 | Disposition: A | Discharge status: Home / Self Care | Payer: BLUE CROSS/BLUE SHIELD

## 2017-03-23 ENCOUNTER — Encounter: Payer: Self-pay | Admitting: Gynecology

## 2017-03-23 DIAGNOSIS — R059 Cough, unspecified: Secondary | ICD-10-CM

## 2017-03-23 DIAGNOSIS — F439 Reaction to severe stress, unspecified: Secondary | ICD-10-CM

## 2017-03-23 DIAGNOSIS — R05 Cough: Secondary | ICD-10-CM | POA: Diagnosis not present

## 2017-03-23 DIAGNOSIS — H6983 Other specified disorders of Eustachian tube, bilateral: Secondary | ICD-10-CM

## 2017-03-23 DIAGNOSIS — H6993 Unspecified Eustachian tube disorder, bilateral: Secondary | ICD-10-CM

## 2017-03-23 NOTE — Discharge Instructions (Signed)
Futicasone nasal SPray 1 STEN BID Robitussin DM /generic 1-2 tsp q 6 h prn  Hydration  Report today's visit to Dr Army Melia office tomorrow- BP elevated on arrival, emotional stress- repeat prior to discharge 130/99 Develop fever, malaise , productive cough RTC Urgent Care or Dr Army Melia

## 2017-03-23 NOTE — ED Triage Notes (Signed)
Patient c/o sinus/ ear clogged, nasal drainage and chest congestion x 3 days.

## 2017-03-23 NOTE — ED Provider Notes (Signed)
CSN: [id number]     Arrival date & time 03/23/17  1507 History   First MD Initiated Contact with Patient 03/23/17 1524     Chief Complaint  Patient presents with  . Sinusitis   (Consider location/radiation/quality/duration/timing/severity/associated sxs/prior Treatment) 46 yo F presents requesting " antibiotics for a sinus infection" On arrival she is crying and upset Copious clear nasal discharge Ears have been popping and squeaking last few days  No fever, no malaise- reports early morning cough Partner smokes "all the time" in the house Consultation reveals she is in acute phase of an 8 year personal relationship break up Partner wants to leave- patient distraught about end and about having to start over  Patient anxious about needing Rx to "help her body deal with upheaval"  Has Adrenal hyperplasia- Chronic prednisone Rx 20 mg qd Florinef 0.1 mg  Hx cleft palate repair No menses Reports hx of BP elevation when under stress-- Presented at 145/100    Crying-no chest pain, SOB,          Past Medical History:  Diagnosis Date  . Adrenal benign tumor   . Adrenal hyperplasia [facility])    Past Surgical History:  Procedure Laterality Date  . CHOLECYSTECTOMY    . CLEFT PALATE REPAIR    . VAGINA RECONSTRUCTION SURGERY     Family History  Problem Relation Age of Onset  . Ovarian cancer Mother   . Uterine cancer Sister    Social History  Substance Use Topics  . Smoking status: Never Smoker  . Smokeless tobacco: Never Used  . Alcohol use No   OB History    No data available     Review of Systems  Constitutional: Positive for appetite change. Negative for chills and fever.       Situational stress -decreased appetite  HENT: Positive for congestion, ear pain and rhinorrhea. Negative for sinus pain, sneezing, sore throat and trouble swallowing.        Bilateral ear pressure, no pain  Eyes:       Reddened , crying   clear tears, no erythema  Respiratory: Negative for  cough, shortness of breath and wheezing.   Cardiovascular: Negative.   Gastrointestinal: Negative.   Endocrine:       HPI  Genitourinary: Negative.   Musculoskeletal: Negative.   Allergic/Immunologic: Negative.   Neurological: Negative.   Hematological: Negative.   Psychiatric/Behavioral:       Emotionally stressed, situational- denies any thought of personal harm or harm to another    Allergies  Benzodiazepines; Tramadol; Morphine and related; Nucynta [tapentadol]; and Oxycodone  Home Medications   Prior to Admission medications   Medication Sig Start Date End Date Taking? Authorizing Provider  fludrocortisone (FLORINEF) 0.1 MG tablet Take 0.1 mg by mouth daily.   Yes [provider]  ibuprofen (ADVIL,MOTRIN) 800 MG tablet Take 1 tablet (800 mg total) by mouth every 8 (eight) hours as needed. 02/16/17  Yes Duanne Guess, PA-C  predniSONE (DELTASONE) 10 MG tablet Take 20 mg by mouth 2 (two) times daily with a meal.    Yes [provider]  acetaminophen (TYLENOL) 500 MG tablet Take 500 mg by mouth every 6 (six) hours as needed.    [provider]  diclofenac sodium (VOLTAREN) 1 % GEL Apply 2 g topically 4 (four) times daily. 12/18/16   Glean Hess, MD   Meds Ordered and Administered this Visit  Medications - No data to display  BP (!) 130/99 (BP Location: Left  Arm)   Pulse (!) 110   Temp 98.3 F (36.8 C) (Oral)   Resp 16   Wt 170 lb (77.1 kg)   SpO2 100%   BMI 34.34 kg/m  No data found.   Physical Exam  Constitutional: She is oriented to person, place, and time. She appears well-developed and well-nourished.  Mild distress, tearful  HENT:  Head: Normocephalic and atraumatic.  Right Ear: External ear normal.  Left Ear: External ear normal.  Mouth/Throat: Oropharynx is clear and moist.  Scarring and distortion of TMs right >left but no fluid level or erythema noted. Hx of tubes  Clear nasal discharge Cleft palate repair in childhood   Eyes: Pupils are equal, round, and reactive to light. Conjunctivae and EOM are normal.  Neck: Normal range of motion. Neck supple.  Cardiovascular: Normal rate and regular rhythm.   Pulmonary/Chest: Effort normal and breath sounds normal. She has no wheezes.  Mild congestion , clears with cough         02 100%  Musculoskeletal: Normal range of motion.  Lymphadenopathy:    She has no cervical adenopathy.  Neurological: She is alert and oriented to person, place, and time.  Skin: Skin is warm and dry.  Psychiatric: She has a normal mood and affect.    Urgent Care Course    Patient was examined and counseled re: antibiotics not indicated at this time Treat symptoms and observe Mild congestion that cleared with cough may be related to post nasal drip/tears Smoke exposure in household 02 100%  reasurred Offered inhalation therapy which she refused Encourage hydration, Add intervention per discharge orders -reviewed with patient   Procedures (including critical care time)  Labs Review Labs Reviewed - No data to display  Imaging Review No results found.     MDM   1. Eustachian tube dysfunction, bilateral   2. Cough   3. Situational stress     Plan: See patient discharge instructions Rx as per orders;  benefits, risks, potential side effects reviewed   Seek additional medical care if symptoms worsen or are not improving     Jan Fireman, PA-C 03/23/17 1632

## 2017-10-28 DIAGNOSIS — L821 Other seborrheic keratosis: Secondary | ICD-10-CM | POA: Diagnosis not present

## 2017-10-28 DIAGNOSIS — L570 Actinic keratosis: Secondary | ICD-10-CM | POA: Diagnosis not present

## 2017-10-30 DIAGNOSIS — F4320 Adjustment disorder, unspecified: Secondary | ICD-10-CM | POA: Diagnosis not present

## 2017-11-17 DIAGNOSIS — F321 Major depressive disorder, single episode, moderate: Secondary | ICD-10-CM | POA: Diagnosis not present

## 2017-12-01 ENCOUNTER — Ambulatory Visit: Payer: BLUE CROSS/BLUE SHIELD | Admitting: Internal Medicine

## 2017-12-01 ENCOUNTER — Encounter: Payer: Self-pay | Admitting: Internal Medicine

## 2017-12-01 VITALS — BP 136/88 | Resp 16 | Ht 59.0 in | Wt 170.1 lb

## 2017-12-01 DIAGNOSIS — F39 Unspecified mood [affective] disorder: Secondary | ICD-10-CM | POA: Diagnosis not present

## 2017-12-01 DIAGNOSIS — E250 Congenital adrenogenital disorders associated with enzyme deficiency: Secondary | ICD-10-CM | POA: Diagnosis not present

## 2017-12-01 DIAGNOSIS — F321 Major depressive disorder, single episode, moderate: Secondary | ICD-10-CM | POA: Diagnosis not present

## 2017-12-01 DIAGNOSIS — F5102 Adjustment insomnia: Secondary | ICD-10-CM

## 2017-12-01 MED ORDER — TRAZODONE HCL 50 MG PO TABS: 25.0000 mg | ORAL_TABLET | Freq: Every evening | ORAL | 1 refills | Status: DC | PRN

## 2017-12-01 NOTE — Progress Notes (Signed)
Date:  12/01/2017   Name:  Tina Carrillo   DOB:  1971-06-13   MRN:  [id number]   Chief Complaint: Depression (Depression has been bad for last 9 mo and has taken up smoking since depression started. ) Depression         This is a new problem.  The current episode started more than 1 month ago.   The onset quality is undetermined.   The problem occurs constantly.  The problem has been gradually worsening since onset.  Associated symptoms include no fatigue and no suicidal ideas. Onset 9 months ago when she separated from her partner of 8 years.  They are working to improve themselves but talk or text every day.  Tina Carrillo is now seeing a counselor and would like to discuss something for sleep.  She does not want to take anything habit forming and she does not want to take an antidepressant medication at this time.  Review of Systems  Constitutional: Negative for chills, fatigue, fever and unexpected weight change.  Respiratory: Negative for chest tightness and shortness of breath.   Cardiovascular: Negative for chest pain.  Psychiatric/Behavioral: Positive for depression, dysphoric mood and sleep disturbance. Negative for self-injury and suicidal ideas.    Patient Active Problem List   Diagnosis Date Noted  . Adrenal mass ([place]) 05/08/2016  . Type 2 diabetes mellitus without complication, without long-term current use of insulin ([place]) 10/17/2015  . Environmental and seasonal allergies 06/14/2015  . Congenital adrenal hyperplasia ([place]) 08/16/2014    Prior to Admission medications   Medication Sig Start Date End Date Taking? Authorizing Provider  fludrocortisone (FLORINEF) 0.1 MG tablet Take 0.1 mg by mouth daily.   Yes [provider]  predniSONE (DELTASONE) 10 MG tablet Take 20 mg by mouth 2 (two) times daily with a meal.    Yes [provider]    Allergies  Allergen Reactions  . Benzodiazepines Hives  . Tramadol Nausea And Vomiting  . Morphine And Related Nausea  And Vomiting  . Nucynta [Tapentadol] Nausea And Vomiting  . Oxycodone Itching    Past Surgical History:  Procedure Laterality Date  . CHOLECYSTECTOMY    . CLEFT PALATE REPAIR    . VAGINA RECONSTRUCTION SURGERY      Social History   Tobacco Use  . Smoking status: Current Every Day Smoker    Start date: 03/17/2017  . Smokeless tobacco: Never Used  Substance Use Topics  . Alcohol use: Yes    Alcohol/week: 1.2 oz    Types: 2 Cans of beer per week  . Drug use: No     Medication list has been reviewed and updated.  PHQ 2/9 Scores 12/01/2017  PHQ - 2 Score 6  PHQ- 9 Score 20    Physical Exam  Constitutional: She is oriented to person, place, and time. She appears well-developed. No distress.  HENT:  Head: Normocephalic and atraumatic.  Cardiovascular: Normal rate, regular rhythm and normal heart sounds.  Pulmonary/Chest: Effort normal and breath sounds normal. No respiratory distress. She has no wheezes.  Musculoskeletal: She exhibits no edema.  Neurological: She is alert and oriented to person, place, and time.  Skin: Skin is warm and dry. No rash noted.  Psychiatric: Her speech is normal and behavior is normal. Judgment and thought content normal. Cognition and memory are normal. She exhibits a depressed mood.  Nursing note and vitals reviewed.   BP 136/88   Resp 16   Ht 4' 11"  (1.499 m)  Wt 170 lb 1.6 oz (77.2 kg)   BMI 34.36 kg/m   Assessment and Plan: 1. Mood disorder ([place]) Continue to follow up with counselor  2. Adjustment insomnia Try low dose trazodone for sleep - traZODone (DESYREL) 50 MG tablet; Take 0.5-1 tablets (25-50 mg total) by mouth at bedtime as needed for sleep.  Dispense: 30 tablet; Refill: 1  3. Congenital adrenal hyperplasia ([place]) Followed by Dr. Gabriel Carina   Meds ordered this encounter  Medications  . traZODone (DESYREL) 50 MG tablet    Sig: Take 0.5-1 tablets (25-50 mg total) by mouth at bedtime as needed for sleep.    Dispense:  30  tablet    Refill:  1    Partially dictated using Editor, commissioning. Any errors are unintentional.  Halina Maidens, MD [facility]  12/01/2017

## 2017-12-16 DIAGNOSIS — F321 Major depressive disorder, single episode, moderate: Secondary | ICD-10-CM | POA: Diagnosis not present

## 2017-12-23 ENCOUNTER — Other Ambulatory Visit: Payer: Self-pay | Admitting: Internal Medicine

## 2017-12-23 DIAGNOSIS — F5102 Adjustment insomnia: Secondary | ICD-10-CM

## 2018-01-07 DIAGNOSIS — F321 Major depressive disorder, single episode, moderate: Secondary | ICD-10-CM | POA: Diagnosis not present

## 2018-01-20 ENCOUNTER — Ambulatory Visit: Payer: BLUE CROSS/BLUE SHIELD | Admitting: Internal Medicine

## 2018-01-20 ENCOUNTER — Encounter: Payer: Self-pay | Admitting: Internal Medicine

## 2018-01-20 VITALS — BP 110/70 | HR 99 | Resp 16 | Ht 59.0 in | Wt 165.0 lb

## 2018-01-20 DIAGNOSIS — S39012A Strain of muscle, fascia and tendon of lower back, initial encounter: Secondary | ICD-10-CM

## 2018-01-20 MED ORDER — METHOCARBAMOL 500 MG PO TABS: 500.0000 mg | ORAL_TABLET | Freq: Four times a day (QID) | ORAL | 0 refills | Status: DC

## 2018-01-20 NOTE — Patient Instructions (Signed)
Continue heat and advil along with robaxin.

## 2018-01-20 NOTE — Progress Notes (Signed)
Date:  01/20/2018   Name:  Tina Carrillo   DOB:  1970-09-29   MRN:  [id number]   Chief Complaint: Back Pain (muscle lower ) Back Pain  This is a new problem. The current episode started in the past 7 days. The problem is unchanged. The pain is present in the lumbar spine. The pain does not radiate. The pain is moderate. Pertinent negatives include no chest pain, headaches, numbness or weakness.  Injury occurred two days ago when she twisted her lower back trying to avoid laying her motorcycle down while rolling it out of her garage.   Review of Systems  Constitutional: Negative for chills.  Respiratory: Negative for chest tightness and shortness of breath.   Cardiovascular: Negative for chest pain and palpitations.  Musculoskeletal: Positive for back pain.  Neurological: Negative for tremors, weakness, numbness and headaches.    Patient Active Problem List   Diagnosis Date Noted  . Mood disorder ([place]) 12/01/2017  . Adjustment insomnia 12/01/2017  . Adrenal mass ([place]) 05/08/2016  . Type 2 diabetes mellitus without complication, without long-term current use of insulin ([place]) 10/17/2015  . Environmental and seasonal allergies 06/14/2015  . Congenital adrenal hyperplasia ([place]) 08/16/2014    Prior to Admission medications   Medication Sig Start Date End Date Taking? Authorizing Provider  fludrocortisone (FLORINEF) 0.1 MG tablet Take 0.1 mg by mouth daily.   Yes [provider]  ibuprofen (ADVIL,MOTRIN) 200 MG tablet Take 200 mg by mouth every 6 (six) hours as needed.   Yes [provider]  predniSONE (DELTASONE) 10 MG tablet Take 20 mg by mouth 2 (two) times daily with a meal.    Yes [provider]  traZODone (DESYREL) 50 MG tablet TAKE 0.5-1 TABLETS (25-50 MG TOTAL) BY MOUTH AT BEDTIME AS NEEDED FOR SLEEP. 12/23/17  Yes Glean Hess, MD    Allergies  Allergen Reactions  . Benzodiazepines Hives  . Tramadol Nausea And Vomiting  . Morphine And  Related Nausea And Vomiting  . Nucynta [Tapentadol] Nausea And Vomiting  . Oxycodone Itching    Past Surgical History:  Procedure Laterality Date  . CHOLECYSTECTOMY    . CLEFT PALATE REPAIR    . VAGINA RECONSTRUCTION SURGERY      Social History   Tobacco Use  . Smoking status: Current Every Day Smoker    Start date: 03/17/2017  . Smokeless tobacco: Never Used  Substance Use Topics  . Alcohol use: Yes    Alcohol/week: 1.2 oz    Types: 2 Cans of beer per week  . Drug use: No     Medication list has been reviewed and updated.  Current Meds  Medication Sig  . fludrocortisone (FLORINEF) 0.1 MG tablet Take 0.1 mg by mouth daily.  Marland Kitchen ibuprofen (ADVIL,MOTRIN) 200 MG tablet Take 200 mg by mouth every 6 (six) hours as needed.  . predniSONE (DELTASONE) 10 MG tablet Take 20 mg by mouth 2 (two) times daily with a meal.   . traZODone (DESYREL) 50 MG tablet TAKE 0.5-1 TABLETS (25-50 MG TOTAL) BY MOUTH AT BEDTIME AS NEEDED FOR SLEEP.    PHQ 2/9 Scores 12/01/2017  PHQ - 2 Score 6  PHQ- 9 Score 20    Physical Exam  Constitutional: She is oriented to person, place, and time. She appears well-developed. No distress.  HENT:  Head: Normocephalic and atraumatic.  Cardiovascular: Normal rate, regular rhythm and normal heart sounds.  Pulmonary/Chest: Effort normal and breath sounds normal. No respiratory distress.  Musculoskeletal: Normal range of motion.       Lumbar back: She exhibits pain and spasm.  Neurological: She is alert and oriented to person, place, and time.  Skin: Skin is warm and dry. No rash noted.  Psychiatric: She has a normal mood and affect. Her behavior is normal. Thought content normal.  Nursing note and vitals reviewed.   BP 110/70   Pulse 99   Resp 16   Ht 4' 11"  (1.499 m)   Wt 165 lb (74.8 kg)   SpO2 99%   BMI 33.33 kg/m   Assessment and Plan: 1. Low back strain, initial encounter Continue heat and advil Letter for work given. - methocarbamol (ROBAXIN)  500 MG tablet; Take 1 tablet (500 mg total) by mouth 4 (four) times daily.  Dispense: 40 tablet; Refill: 0   Meds ordered this encounter  Medications  . methocarbamol (ROBAXIN) 500 MG tablet    Sig: Take 1 tablet (500 mg total) by mouth 4 (four) times daily.    Dispense:  40 tablet    Refill:  0    Partially dictated using Editor, commissioning. Any errors are unintentional.  Halina Maidens, MD [facility]  01/20/2018  There are no diagnoses linked to this encounter.

## 2018-01-20 NOTE — Progress Notes (Deleted)
    Date:  01/20/2018   Name:  Tina Carrillo   DOB:  1971/05/28   MRN:  [id number]   Chief Complaint: Back Pain (muscle lower ) Back Pain  This is a new problem. The current episode started in the past 7 days. The problem is unchanged. The pain is present in the lumbar spine. The pain does not radiate. The pain is moderate. Pertinent negatives include no chest pain, headaches, numbness or weakness.      Review of Systems  Constitutional: Negative for chills.  Respiratory: Negative for chest tightness and shortness of breath.   Cardiovascular: Negative for chest pain and palpitations.  Musculoskeletal: Positive for back pain.  Neurological: Negative for tremors, weakness, numbness and headaches.    Patient Active Problem List   Diagnosis Date Noted  . Mood disorder ([place]) 12/01/2017  . Adjustment insomnia 12/01/2017  . Adrenal mass ([place]) 05/08/2016  . Type 2 diabetes mellitus without complication, without long-term current use of insulin ([place]) 10/17/2015  . Environmental and seasonal allergies 06/14/2015  . Congenital adrenal hyperplasia ([place]) 08/16/2014    Prior to Admission medications   Medication Sig Start Date End Date Taking? Authorizing Provider  fludrocortisone (FLORINEF) 0.1 MG tablet Take 0.1 mg by mouth daily.   Yes [provider]  ibuprofen (ADVIL,MOTRIN) 200 MG tablet Take 200 mg by mouth every 6 (six) hours as needed.   Yes [provider]  predniSONE (DELTASONE) 10 MG tablet Take 20 mg by mouth 2 (two) times daily with a meal.    Yes [provider]  traZODone (DESYREL) 50 MG tablet TAKE 0.5-1 TABLETS (25-50 MG TOTAL) BY MOUTH AT BEDTIME AS NEEDED FOR SLEEP. 12/23/17  Yes Glean Hess, MD    Allergies  Allergen Reactions  . Benzodiazepines Hives  . Tramadol Nausea And Vomiting  . Morphine And Related Nausea And Vomiting  . Nucynta [Tapentadol] Nausea And Vomiting  . Oxycodone Itching    Past Surgical History:  Procedure  Laterality Date  . CHOLECYSTECTOMY    . CLEFT PALATE REPAIR    . VAGINA RECONSTRUCTION SURGERY      Social History   Tobacco Use  . Smoking status: Current Every Day Smoker    Start date: 03/17/2017  . Smokeless tobacco: Never Used  Substance Use Topics  . Alcohol use: Yes    Alcohol/week: 1.2 oz    Types: 2 Cans of beer per week  . Drug use: No     Medication list has been reviewed and updated.  Current Meds  Medication Sig  . fludrocortisone (FLORINEF) 0.1 MG tablet Take 0.1 mg by mouth daily.  Marland Kitchen ibuprofen (ADVIL,MOTRIN) 200 MG tablet Take 200 mg by mouth every 6 (six) hours as needed.  . predniSONE (DELTASONE) 10 MG tablet Take 20 mg by mouth 2 (two) times daily with a meal.   . traZODone (DESYREL) 50 MG tablet TAKE 0.5-1 TABLETS (25-50 MG TOTAL) BY MOUTH AT BEDTIME AS NEEDED FOR SLEEP.    PHQ 2/9 Scores 12/01/2017  PHQ - 2 Score 6  PHQ- 9 Score 20    Physical Exam  BP 110/70   Pulse 99   Resp 16   Ht 4' 11"  (1.499 m)   Wt 165 lb (74.8 kg)   SpO2 99%   BMI 33.33 kg/m   Assessment and Plan:  There are no diagnoses linked to this encounter.

## 2018-01-28 DIAGNOSIS — E119 Type 2 diabetes mellitus without complications: Secondary | ICD-10-CM | POA: Diagnosis not present

## 2018-01-28 LAB — HEMOGLOBIN A1C: Hemoglobin A1C: 7.2

## 2018-02-04 DIAGNOSIS — E250 Congenital adrenogenital disorders associated with enzyme deficiency: Secondary | ICD-10-CM | POA: Diagnosis not present

## 2018-02-04 DIAGNOSIS — E119 Type 2 diabetes mellitus without complications: Secondary | ICD-10-CM | POA: Diagnosis not present

## 2018-02-04 DIAGNOSIS — E2839 Other primary ovarian failure: Secondary | ICD-10-CM | POA: Diagnosis not present

## 2018-02-04 DIAGNOSIS — E279 Disorder of adrenal gland, unspecified: Secondary | ICD-10-CM | POA: Diagnosis not present

## 2018-02-09 ENCOUNTER — Ambulatory Visit: Payer: BLUE CROSS/BLUE SHIELD | Admitting: Internal Medicine

## 2018-02-09 ENCOUNTER — Encounter: Payer: Self-pay | Admitting: Internal Medicine

## 2018-02-09 VITALS — BP 122/62 | HR 61 | Temp 98.1°F | Resp 16 | Ht 59.0 in | Wt 166.0 lb

## 2018-02-09 DIAGNOSIS — H6690 Otitis media, unspecified, unspecified ear: Secondary | ICD-10-CM

## 2018-02-09 DIAGNOSIS — E119 Type 2 diabetes mellitus without complications: Secondary | ICD-10-CM | POA: Diagnosis not present

## 2018-02-09 MED ORDER — AZITHROMYCIN 250 MG PO TABS: ORAL_TABLET | ORAL | 0 refills | Status: AC

## 2018-02-09 NOTE — Progress Notes (Signed)
Date:  02/09/2018   Name:  Tina Carrillo   DOB:  1970-11-12   MRN:  [id number]   Chief Complaint: Sinus Problem (ringing in ears ) Sinus Problem  This is a new problem. The current episode started in the past 7 days. The problem is unchanged. There has been no fever. The pain is moderate. Associated symptoms include ear pain, sinus pressure and a sore throat. Pertinent negatives include no chills, coughing or shortness of breath. Past treatments include nothing.   DM - now in the diabetic range but reluctant to take medication recommended by Endo (metformin).  CAH - continues on adrenal steroid replacement.  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  No recent ED visits for fluids.  Sleep/mood disorder - doing much better.  Now seeing someone in [place].  Likes her job with a better environment.  Sleep improved.  Review of Systems  Constitutional: Negative for chills, fatigue and fever.  HENT: Positive for ear discharge, ear pain, sinus pressure and sore throat. Negative for rhinorrhea and voice change.   Respiratory: Negative for cough, chest tightness, shortness of breath and wheezing.   Cardiovascular: Negative for chest pain and palpitations.    Patient Active Problem List   Diagnosis Date Noted  . Mood disorder ([place]) 12/01/2017  . Adjustment insomnia 12/01/2017  . Adrenal mass ([place]) 05/08/2016  . Type 2 diabetes mellitus without complication, without long-term current use of insulin ([place]) 10/17/2015  . Environmental and seasonal allergies 06/14/2015  . Congenital adrenal hyperplasia ([place]) 08/16/2014    Prior to Admission medications   Medication Sig Start Date End Date Taking? Authorizing Provider  fludrocortisone (FLORINEF) 0.1 MG tablet Take 0.1 mg by mouth daily.   Yes [provider]  ibuprofen (ADVIL,MOTRIN) 200 MG tablet Take 200 mg by mouth every 6 (six) hours as needed.   Yes [provider]  methocarbamol (ROBAXIN) 500 MG  tablet Take 1 tablet (500 mg total) by mouth 4 (four) times daily. 01/20/18  Yes Glean Hess, MD  predniSONE (DELTASONE) 10 MG tablet Take 20 mg by mouth 2 (two) times daily with a meal.    Yes [provider]  traZODone (DESYREL) 50 MG tablet TAKE 0.5-1 TABLETS (25-50 MG TOTAL) BY MOUTH AT BEDTIME AS NEEDED FOR SLEEP. 12/23/17  Yes Glean Hess, MD    Allergies  Allergen Reactions  . Benzodiazepines Hives  . Tramadol Nausea And Vomiting  . Morphine And Related Nausea And Vomiting  . Nucynta [Tapentadol] Nausea And Vomiting  . Oxycodone Itching    Past Surgical History:  Procedure Laterality Date  . CHOLECYSTECTOMY    . CLEFT PALATE REPAIR    . VAGINA RECONSTRUCTION SURGERY      Social History   Tobacco Use  . Smoking status: Current Every Day Smoker    Start date: 03/17/2017  . Smokeless tobacco: Never Used  Substance Use Topics  . Alcohol use: Yes    Alcohol/week: 1.2 oz    Types: 2 Cans of beer per week  . Drug use: No     Medication list has been reviewed and updated.  Current Meds  Medication Sig  . fludrocortisone (FLORINEF) 0.1 MG tablet Take 0.1 mg by mouth daily.  Marland Kitchen ibuprofen (ADVIL,MOTRIN) 200 MG tablet Take 200 mg by mouth every 6 (six) hours as needed.  . methocarbamol (ROBAXIN) 500 MG tablet Take 1 tablet (500 mg total) by mouth 4 (four) times daily.  . predniSONE (DELTASONE)  10 MG tablet Take 20 mg by mouth 2 (two) times daily with a meal.   . traZODone (DESYREL) 50 MG tablet TAKE 0.5-1 TABLETS (25-50 MG TOTAL) BY MOUTH AT BEDTIME AS NEEDED FOR SLEEP.    PHQ 2/9 Scores 02/09/2018 12/01/2017  PHQ - 2 Score 0 6  PHQ- 9 Score - 20    Physical Exam  Constitutional: She is oriented to person, place, and time. She appears well-developed and well-nourished.  HENT:  Right Ear: External ear and ear canal normal. Tympanic membrane is scarred. Tympanic membrane is not erythematous and not retracted.  Left Ear: External ear and ear canal normal.  Tympanic membrane is scarred and erythematous. Tympanic membrane is not retracted.  Nose: Right sinus exhibits no maxillary sinus tenderness and no frontal sinus tenderness. Left sinus exhibits no maxillary sinus tenderness and no frontal sinus tenderness.  Mouth/Throat: Uvula is midline and mucous membranes are normal. No oral lesions. No oropharyngeal exudate or posterior oropharyngeal erythema.  Cardiovascular: Normal rate, regular rhythm and normal heart sounds.  Pulmonary/Chest: Effort normal and breath sounds normal. She has no wheezes. She has no rales.  Lymphadenopathy:    She has no cervical adenopathy.  Neurological: She is alert and oriented to person, place, and time.    BP 122/62   Pulse 61   Temp 98.1 F (36.7 C) (Oral)   Resp 16   Ht 4' 11"  (1.499 m)   Wt 166 lb (75.3 kg)   SpO2 98%   BMI 33.53 kg/m   Assessment and Plan: 1. Acute otitis media, unspecified otitis media type - azithromycin (ZITHROMAX Z-PAK) 250 MG tablet; UAD  Dispense: 6 each; Refill: 0  2. Type 2 diabetes mellitus without complication, without long-term current use of insulin (HCC) Followed by endo Recommend diabetic eye exam   Meds ordered this encounter  Medications  . azithromycin (ZITHROMAX Z-PAK) 250 MG tablet    Sig: UAD    Dispense:  6 each    Refill:  0    Partially dictated using Editor, commissioning. Any errors are unintentional.  Halina Maidens, MD [facility]  02/09/2018   There are no diagnoses linked to this encounter.

## 2018-02-09 NOTE — Patient Instructions (Signed)
flonase nasal spray as needed for sinus congestioin

## 2018-02-18 ENCOUNTER — Telehealth: Payer: Self-pay

## 2018-02-18 ENCOUNTER — Other Ambulatory Visit: Payer: Self-pay | Admitting: Internal Medicine

## 2018-02-18 MED ORDER — CIPROFLOXACIN-HYDROCORTISONE 0.2-1 % OT SUSP: 3.0000 [drp] | Freq: Two times a day (BID) | OTIC | 0 refills | Status: DC

## 2018-02-18 MED ORDER — NEOMYCIN-POLYMYXIN-HC 3.5-10000-1 OT SOLN: 3.0000 [drp] | Freq: Four times a day (QID) | OTIC | 0 refills | Status: AC

## 2018-02-18 NOTE — Telephone Encounter (Signed)
New Rx sent to pharmacy

## 2018-02-18 NOTE — Telephone Encounter (Signed)
Cipro HC OTIC is not covered under patient insurance and will need to send in Neomycin-polymyxin-hydrocortisone to [facility]. Cost of this medication will be $10.

## 2018-02-18 NOTE — Telephone Encounter (Signed)
Patient notified

## 2018-02-18 NOTE — Telephone Encounter (Signed)
I sent in ear drops but I'm not confidant they will help.  She may need to get another round of oral antibiotics if the drops are not effective.

## 2018-02-18 NOTE — Telephone Encounter (Signed)
Patient called and stated she is still having same sx. She had finished her Z-pak abx and she is still feeling the same as she did at her OV 02/09/2018. Patient is wanting a abx of ear drops sent into pharmacy. Please advise. Patient can be contacted at [phone] 10 am -10:30 am or 1 pm-1;30 pm.

## 2018-03-31 DIAGNOSIS — R109 Unspecified abdominal pain: Secondary | ICD-10-CM | POA: Diagnosis not present

## 2018-03-31 DIAGNOSIS — M545 Low back pain: Secondary | ICD-10-CM | POA: Diagnosis not present

## 2018-03-31 DIAGNOSIS — H66003 Acute suppurative otitis media without spontaneous rupture of ear drum, bilateral: Secondary | ICD-10-CM | POA: Diagnosis not present

## 2018-03-31 DIAGNOSIS — J019 Acute sinusitis, unspecified: Secondary | ICD-10-CM | POA: Diagnosis not present

## 2018-03-31 DIAGNOSIS — R300 Dysuria: Secondary | ICD-10-CM | POA: Diagnosis not present

## 2018-05-19 ENCOUNTER — Other Ambulatory Visit
Admission: RE | Admit: 2018-05-19 | Discharge: 2018-05-19 | Disposition: A | Discharge status: Home / Self Care | Payer: BLUE CROSS/BLUE SHIELD | Source: Ambulatory Visit | Attending: Internal Medicine | Admitting: Internal Medicine

## 2018-05-19 ENCOUNTER — Encounter: Payer: Self-pay | Admitting: Internal Medicine

## 2018-05-19 ENCOUNTER — Ambulatory Visit: Payer: BLUE CROSS/BLUE SHIELD | Admitting: Internal Medicine

## 2018-05-19 VITALS — BP 120/88 | HR 114 | Ht 59.0 in | Wt 177.0 lb

## 2018-05-19 DIAGNOSIS — E119 Type 2 diabetes mellitus without complications: Secondary | ICD-10-CM | POA: Diagnosis not present

## 2018-05-19 DIAGNOSIS — Q520 Congenital absence of vagina: Secondary | ICD-10-CM | POA: Diagnosis not present

## 2018-05-19 DIAGNOSIS — Z1231 Encounter for screening mammogram for malignant neoplasm of breast: Secondary | ICD-10-CM

## 2018-05-19 DIAGNOSIS — N3946 Mixed incontinence: Secondary | ICD-10-CM

## 2018-05-19 DIAGNOSIS — F5102 Adjustment insomnia: Secondary | ICD-10-CM

## 2018-05-19 DIAGNOSIS — E250 Congenital adrenogenital disorders associated with enzyme deficiency: Secondary | ICD-10-CM

## 2018-05-19 DIAGNOSIS — Z1239 Encounter for other screening for malignant neoplasm of breast: Secondary | ICD-10-CM

## 2018-05-19 LAB — CBC WITH DIFFERENTIAL/PLATELET
Basophils Absolute: 0.2 10*3/uL — ABNORMAL HIGH (ref 0–0.1)
Basophils Relative: 1 %
Eosinophils Absolute: 0.1 10*3/uL (ref 0–0.7)
Eosinophils Relative: 1 %
HCT: 44.6 % (ref 35.0–47.0)
Hemoglobin: 14.8 g/dL (ref 12.0–16.0)
Lymphocytes Relative: 21 %
Lymphs Abs: 3.8 10*3/uL — ABNORMAL HIGH (ref 1.0–3.6)
MCH: 27.7 pg (ref 26.0–34.0)
MCHC: 33.1 g/dL (ref 32.0–36.0)
MCV: 83.7 fL (ref 80.0–100.0)
Monocytes Absolute: 1 10*3/uL — ABNORMAL HIGH (ref 0.2–0.9)
Monocytes Relative: 5 %
Neutro Abs: 13.6 10*3/uL — ABNORMAL HIGH (ref 1.4–6.5)
Neutrophils Relative %: 72 %
Platelets: 322 10*3/uL (ref 150–440)
RBC: 5.33 MIL/uL — ABNORMAL HIGH (ref 3.80–5.20)
RDW: 14.5 % (ref 11.5–14.5)
WBC: 18.7 10*3/uL — ABNORMAL HIGH (ref 3.6–11.0)

## 2018-05-19 LAB — POCT URINALYSIS DIPSTICK
Bilirubin, UA: NEGATIVE
Blood, UA: NEGATIVE
Glucose, UA: NEGATIVE
Ketones, UA: NEGATIVE
Leukocytes, UA: NEGATIVE
Nitrite, UA: NEGATIVE
Protein, UA: NEGATIVE
Spec Grav, UA: >=1.03 — AB (ref 1.010–1.025)
Urobilinogen, UA: 0.2 E.U./dL
pH, UA: 5 (ref 5.0–8.0)

## 2018-05-19 LAB — COMPREHENSIVE METABOLIC PANEL
ALT: 28 U/L (ref 0–44)
AST: 21 U/L (ref 15–41)
Albumin: 4.4 g/dL (ref 3.5–5.0)
Alkaline Phosphatase: 71 U/L (ref 38–126)
Anion gap: 11 (ref 5–15)
BUN: 25 mg/dL — ABNORMAL HIGH (ref 6–20)
CO2: 24 mmol/L (ref 22–32)
Calcium: 9.1 mg/dL (ref 8.9–10.3)
Chloride: 101 mmol/L (ref 98–111)
Creatinine, Ser: 0.79 mg/dL (ref 0.44–1.00)
GFR calc Af Amer: >60 mL/min (ref >60)
GFR calc non Af Amer: >60 mL/min (ref >60)
Glucose, Bld: 133 mg/dL — ABNORMAL HIGH (ref 70–99)
Potassium: 3.8 mmol/L (ref 3.5–5.1)
Sodium: 136 mmol/L (ref 135–145)
Total Bilirubin: 0.4 mg/dL (ref 0.3–1.2)
Total Protein: 7.8 g/dL (ref 6.5–8.1)

## 2018-05-19 NOTE — Progress Notes (Signed)
Date:  05/19/2018   Name:  Tina Carrillo   DOB:  [birth year]   MRN:  [id number]   Chief Complaint: Urinary Incontinence (Started 3 months ago when started trazodone. Burning during urination. No back pain. Not completely emptying. )  Urinary Tract Infection   This is a new problem. The problem has been unchanged. The quality of the pain is described as aching. There has been no fever. Associated symptoms include frequency (and incontinence). Pertinent negatives include no chills. Associated symptoms comments: She noted nocturnal incontinence which improved after stopping trazodone but did not stop completely.  During the day she is fine.  Diabetes  She presents for her follow-up diabetic visit. She has type 2 diabetes mellitus. Pertinent negatives for hypoglycemia include no dizziness or headaches. Pertinent negatives for diabetes include no chest pain and no fatigue. Symptoms are worsening. Current diabetic treatment includes diet (endocrinology started metformin but she is worried about consequences).  Vaginal reconstruction - has this at age 47 for CAH.  She has not had a Pap in many years.  She would need to see GYN due to small size and need for pediatric instruments. Easy bruising - started after she tried a different steroid for her CAH.  She did not do well with weight gain etc so went back to prednisone.  She expected the skin changes to resolve.  HM - she wants to schedule a mammogram.  She always has tender breasts but no mass or lump.  No family hx breast cancer.   Review of Systems  Constitutional: Negative for chills, fatigue and fever.  HENT: Negative for trouble swallowing.   Respiratory: Negative for chest tightness and shortness of breath.   Cardiovascular: Negative for chest pain and palpitations.  Gastrointestinal: Negative for abdominal pain.  Genitourinary: Positive for frequency (and incontinence). Negative for vaginal bleeding and vaginal discharge.       Hot  flashes intermittently  Skin: Positive for color change.  Neurological: Negative for dizziness and headaches.  Hematological: Negative for adenopathy. Bruises/bleeds easily.  Psychiatric/Behavioral: Positive for sleep disturbance. Negative for decreased concentration.    Patient Active Problem List   Diagnosis Date Noted  . Mood disorder ([place]) 12/01/2017  . Adjustment insomnia 12/01/2017  . Adrenal mass ([place]) 05/08/2016  . Type 2 diabetes mellitus without complication, without long-term current use of insulin ([place]) 10/17/2015  . Environmental and seasonal allergies 06/14/2015  . Congenital adrenal hyperplasia ([place]) 08/16/2014    Allergies  Allergen Reactions  . Benzodiazepines Hives  . Tramadol Nausea And Vomiting  . Morphine And Related Nausea And Vomiting  . Nucynta [Tapentadol] Nausea And Vomiting  . Oxycodone Itching    Past Surgical History:  Procedure Laterality Date  . CHOLECYSTECTOMY    . CLEFT PALATE REPAIR    . VAGINA RECONSTRUCTION SURGERY      Social History   Tobacco Use  . Smoking status: Current Every Day Smoker    Start date: 03/17/2017  . Smokeless tobacco: Never Used  Substance Use Topics  . Alcohol use: Yes    Alcohol/week: 2.0 standard drinks    Types: 2 Cans of beer per week  . Drug use: No     Medication list has been reviewed and updated.  Current Meds  Medication Sig  . fludrocortisone (FLORINEF) 0.1 MG tablet Take 0.1 mg by mouth daily.  Marland Kitchen ibuprofen (ADVIL,MOTRIN) 200 MG tablet Take 200 mg by mouth every 6 (six) hours as needed.  . predniSONE (DELTASONE) 10 MG tablet  Take 20 mg by mouth 2 (two) times daily with a meal.   . traZODone (DESYREL) 50 MG tablet TAKE 0.5-1 TABLETS (25-50 MG TOTAL) BY MOUTH AT BEDTIME AS NEEDED FOR SLEEP.    PHQ 2/9 Scores 02/09/2018 12/01/2017  PHQ - 2 Score 0 6  PHQ- 9 Score - 20    Physical Exam  Constitutional: She is oriented to person, place, and time. She appears well-developed. No distress.  HENT:    Head: Normocephalic and atraumatic.  Neck: Normal range of motion. Neck supple.  Cardiovascular: Normal rate, regular rhythm and normal heart sounds.  Pulmonary/Chest: Effort normal and breath sounds normal. No respiratory distress.  Musculoskeletal: Normal range of motion.  Lymphadenopathy:    She has no cervical adenopathy.  Neurological: She is alert and oriented to person, place, and time.  Skin: Skin is warm and dry. No rash noted.  Mild atrophy and scattered healing bruises noted on arms  Psychiatric: She has a normal mood and affect. Her behavior is normal. Thought content normal.  Nursing note and vitals reviewed.   BP 120/88 (BP Location: Right Arm, Patient Position: Sitting, Cuff Size: Normal)   Pulse (!) 114   Ht 4' 11"  (1.499 m)   Wt 177 lb (80.3 kg)   SpO2 98%   BMI 35.75 kg/m   Assessment and Plan: 1. Type 2 diabetes mellitus without complication, without long-term current use of insulin (HCC) Recommend trial of metformin - advised can be stopped if intolerable side effects - Hemoglobin A1c - Comprehensive metabolic panel - CBC with Differential/Platelet  2. Mixed stress and urge urinary incontinence UA negative for infection Refer to specialist - POCT urinalysis dipstick - Ambulatory referral to Urogynecology  3. Vaginal agenesis, partial S/p reconstruction - needs specialist to perform appropriate pelvic and pap smear - Ambulatory referral to Urogynecology  4. Breast cancer screening Pt to schedule - MM 3D SCREEN BREAST BILATERAL; Future  5. Congenital adrenal hyperplasia ([place]) Continue medications and follow up with Endocrinology Continue prednisone  6. Adjustment insomnia Remain off of trazodone   Partially dictated using Editor, commissioning. Any errors are unintentional.  Halina Maidens, MD [facility]  05/19/2018

## 2018-05-20 ENCOUNTER — Other Ambulatory Visit: Payer: Self-pay | Admitting: Internal Medicine

## 2018-05-20 DIAGNOSIS — D72829 Elevated white blood cell count, unspecified: Secondary | ICD-10-CM

## 2018-05-20 LAB — HEMOGLOBIN A1C
Hgb A1c MFr Bld: 7.1 % — ABNORMAL HIGH (ref 4.8–5.6)
Mean Plasma Glucose: 157.07 mg/dL

## 2018-05-20 NOTE — Progress Notes (Signed)
Patient informed of labs. Wants to know why she could be having elevated WBC since she has had no recent URI or UTI issues. Is there any other further tests we can do to find where the infection is?  Please Advise.

## 2018-05-20 NOTE — Progress Notes (Signed)
Spoke with patient. She would like to see Hematology for further testing. Wants note in referral that states to leave Vm if she does not answer and she will call back.

## 2018-06-03 ENCOUNTER — Ambulatory Visit
Admission: RE | Admit: 2018-06-03 | Discharge: 2018-06-03 | Disposition: A | Discharge status: Home / Self Care | Payer: BLUE CROSS/BLUE SHIELD | Source: Ambulatory Visit | Attending: Internal Medicine | Admitting: Internal Medicine

## 2018-06-03 DIAGNOSIS — Z1239 Encounter for other screening for malignant neoplasm of breast: Secondary | ICD-10-CM | POA: Insufficient documentation

## 2018-06-03 DIAGNOSIS — Z1231 Encounter for screening mammogram for malignant neoplasm of breast: Secondary | ICD-10-CM | POA: Diagnosis not present

## 2018-06-04 ENCOUNTER — Inpatient Hospital Stay: Payer: BLUE CROSS/BLUE SHIELD

## 2018-06-04 ENCOUNTER — Other Ambulatory Visit: Payer: Self-pay

## 2018-06-04 ENCOUNTER — Inpatient Hospital Stay: Payer: BLUE CROSS/BLUE SHIELD | Attending: Oncology | Admitting: Oncology

## 2018-06-04 ENCOUNTER — Encounter: Payer: Self-pay | Admitting: Oncology

## 2018-06-04 ENCOUNTER — Other Ambulatory Visit: Payer: Self-pay | Admitting: Internal Medicine

## 2018-06-04 VITALS — BP 132/84 | HR 89 | Temp 96.9°F | Resp 16 | Ht 59.0 in | Wt 178.9 lb

## 2018-06-04 DIAGNOSIS — E2740 Unspecified adrenocortical insufficiency: Secondary | ICD-10-CM | POA: Diagnosis not present

## 2018-06-04 DIAGNOSIS — D72829 Elevated white blood cell count, unspecified: Secondary | ICD-10-CM

## 2018-06-04 DIAGNOSIS — Z7952 Long term (current) use of systemic steroids: Secondary | ICD-10-CM

## 2018-06-04 DIAGNOSIS — Z79899 Other long term (current) drug therapy: Secondary | ICD-10-CM | POA: Diagnosis not present

## 2018-06-04 DIAGNOSIS — N6489 Other specified disorders of breast: Secondary | ICD-10-CM | POA: Diagnosis not present

## 2018-06-04 DIAGNOSIS — F17210 Nicotine dependence, cigarettes, uncomplicated: Secondary | ICD-10-CM | POA: Diagnosis not present

## 2018-06-04 DIAGNOSIS — R928 Other abnormal and inconclusive findings on diagnostic imaging of breast: Secondary | ICD-10-CM

## 2018-06-04 DIAGNOSIS — R5382 Chronic fatigue, unspecified: Secondary | ICD-10-CM | POA: Diagnosis not present

## 2018-06-04 DIAGNOSIS — R233 Spontaneous ecchymoses: Secondary | ICD-10-CM

## 2018-06-04 DIAGNOSIS — R238 Other skin changes: Secondary | ICD-10-CM

## 2018-06-04 LAB — CBC WITH DIFFERENTIAL/PLATELET
Abs Immature Granulocytes: 0.18 10*3/uL — ABNORMAL HIGH (ref 0.00–0.07)
Basophils Absolute: 0.1 10*3/uL (ref 0.0–0.1)
Basophils Relative: 0 %
Eosinophils Absolute: 0 10*3/uL (ref 0.0–0.5)
Eosinophils Relative: 0 %
HCT: 45.2 % (ref 36.0–46.0)
Hemoglobin: 14.3 g/dL (ref 12.0–15.0)
Immature Granulocytes: 1 %
Lymphocytes Relative: 11 %
Lymphs Abs: 1.6 10*3/uL (ref 0.7–4.0)
MCH: 27.2 pg (ref 26.0–34.0)
MCHC: 31.6 g/dL (ref 30.0–36.0)
MCV: 86.1 fL (ref 80.0–100.0)
Monocytes Absolute: 0.6 10*3/uL (ref 0.1–1.0)
Monocytes Relative: 4 %
Neutro Abs: 12.3 10*3/uL — ABNORMAL HIGH (ref 1.7–7.7)
Neutrophils Relative %: 84 %
Platelets: 287 10*3/uL (ref 150–400)
RBC: 5.25 MIL/uL — ABNORMAL HIGH (ref 3.87–5.11)
RDW: 14.2 % (ref 11.5–15.5)
WBC: 14.7 10*3/uL — ABNORMAL HIGH (ref 4.0–10.5)
nRBC: 0 % (ref 0.0–0.2)

## 2018-06-04 LAB — PROTIME-INR
INR: 0.95
Prothrombin Time: 12.6 seconds (ref 11.4–15.2)

## 2018-06-04 LAB — TECHNOLOGIST SMEAR REVIEW

## 2018-06-04 LAB — APTT: aPTT: 26 seconds (ref 24–36)

## 2018-06-04 NOTE — Progress Notes (Signed)
Hematology/Oncology Consult note [facility] [phone] [phone]   Patient Care Team: Glean Hess, MD as PCP - General (Family Medicine)  REFERRING PROVIDER: Glean Hess, MD  CHIEF COMPLAINTS/REASON FOR VISIT:  Evaluation of leukocytosis  HISTORY OF PRESENTING ILLNESS:  Tina Carrillo is a  47 y.o.  female with PMH listed below who was referred to me for evaluation of leukocytosis Reviewed patient' recent labs obtained by PCP.  CBC showed elevated white count of 18.7, differential showed neutrophilia, lymphocytosis, monocytosis and basophilia. Previous lab records reviewed. Leukocytosis onset of chronic, duration is since at least 2014. Patient reports that she has always had chronic leukocytosis.  She is on chronic steroids treatment for congenital adrenal insufficiency.  She currently takes prednisone 20 mg daily.  Sometimes she takes increased dosage if she feels sick. Denies any recent infection. She recalls that she took increased dose of prednisone a few days before labs done on 05/19/2018.  No aggravating or elevated factors. Associated symptoms or signs:  Denies weight loss, fever, chills,night sweats.  Chronic fatigue at baseline. Smoking history: She smokes 3 to 4 cigarettes a day. History of recent oral steroid use or steroid injection: Yes History of recent infection: Denies Endorses easy bruising    Review of Systems  Constitutional: Negative for chills, fever, malaise/fatigue and weight loss.  HENT: Negative for nosebleeds and sore throat.   Eyes: Negative for double vision, photophobia and redness.  Respiratory: Negative for cough, shortness of breath and wheezing.   Cardiovascular: Negative for chest pain, palpitations and orthopnea.  Gastrointestinal: Negative for abdominal pain, blood in stool, nausea and vomiting.  Genitourinary: Negative for dysuria.  Musculoskeletal: Negative for back pain, myalgias  and neck pain.  Skin: Negative for itching and rash.  Neurological: Negative for dizziness, tingling and tremors.  Endo/Heme/Allergies: Negative for environmental allergies. Bruises/bleeds easily.  Psychiatric/Behavioral: Negative for depression.    MEDICAL HISTORY:  Past Medical History:  Diagnosis Date  . Adrenal benign tumor   . Adrenal hyperplasia ([place])     SURGICAL HISTORY: Past Surgical History:  Procedure Laterality Date  . CHOLECYSTECTOMY    . CLEFT PALATE REPAIR    . VAGINA RECONSTRUCTION SURGERY  1985    SOCIAL HISTORY: Social History   Socioeconomic History  . Marital status: Single    Spouse name: Not on file  . Number of children: Not on file  . Years of education: Not on file  . Highest education level: Not on file  Occupational History  . Not on file  Social Needs  . Financial resource strain: Not on file  . Food insecurity:    Worry: Not on file    Inability: Not on file  . Transportation needs:    Medical: Not on file    Non-medical: Not on file  Tobacco Use  . Smoking status: Current Every Day Smoker    Packs/day: 0.25    Years: 1.00    Pack years: 0.25    Types: Cigarettes    Start date: 03/17/2017  . Smokeless tobacco: Never Used  Substance and Sexual Activity  . Alcohol use: Yes    Alcohol/week: 1.0 standard drinks    Types: 1 Cans of beer per week  . Drug use: No  . Sexual activity: Not on file  Lifestyle  . Physical activity:    Days per week: Not on file    Minutes per session: Not on file  . Stress: Not on file  Relationships  .  Social connections:    Talks on phone: Not on file    Gets together: Not on file    Attends religious service: Not on file    Active member of club or organization: Not on file    Attends meetings of clubs or organizations: Not on file    Relationship status: Not on file  . Intimate partner violence:    Fear of current or ex partner: Not on file    Emotionally abused: Not on file    Physically  abused: Not on file    Forced sexual activity: Not on file  Other Topics Concern  . Not on file  Social History Narrative  . Not on file    FAMILY HISTORY: Family History  Problem Relation Age of Onset  . Ovarian cancer Mother   . Bipolar disorder Mother   . Diabetes Mother   . Uterine cancer Sister   . Breast cancer Sister 42       mat half sister  . Hypertension Father   . Heart disease Father   . Diabetes Maternal Grandmother   . Brain cancer Paternal Grandmother     ALLERGIES:  is allergic to benzodiazepines; tramadol; morphine and related; nucynta [tapentadol]; and oxycodone.  MEDICATIONS:  Current Outpatient Medications  Medication Sig Dispense Refill  . fludrocortisone (FLORINEF) 0.1 MG tablet Take 0.1 mg by mouth daily.    Marland Kitchen ibuprofen (ADVIL,MOTRIN) 200 MG tablet Take 200 mg by mouth every 6 (six) hours as needed.    . predniSONE (DELTASONE) 10 MG tablet Take 20 mg by mouth 2 (two) times daily with a meal.     . traZODone (DESYREL) 50 MG tablet TAKE 0.5-1 TABLETS (25-50 MG TOTAL) BY MOUTH AT BEDTIME AS NEEDED FOR SLEEP. 90 tablet 1   No current facility-administered medications for this visit.      PHYSICAL EXAMINATION: ECOG PERFORMANCE STATUS: 0 - Asymptomatic Vitals:   06/04/18 1057  BP: 132/84  Pulse: 89  Resp: 16  Temp: (!) 96.9 F (36.1 C)   Filed Weights   06/04/18 1057  Weight: 178 lb 14.5 oz (81.1 kg)    Physical Exam  Constitutional: She is oriented to person, place, and time. No distress.  HENT:  Head: Normocephalic and atraumatic.  Mouth/Throat: Oropharynx is clear and moist.  Eyes: Pupils are equal, round, and reactive to light. EOM are normal. No scleral icterus.  Neck: Normal range of motion. Neck supple.  Cardiovascular: Normal rate, regular rhythm and normal heart sounds.  Pulmonary/Chest: Effort normal. No respiratory distress. She has no wheezes.  Abdominal: Soft. Bowel sounds are normal. She exhibits no distension and no mass.  There is no tenderness.  Musculoskeletal: Normal range of motion. She exhibits no edema or deformity.  Neurological: She is alert and oriented to person, place, and time. No cranial nerve deficit. Coordination normal.  Skin: Skin is warm and dry. No rash noted. No erythema.  Psychiatric: She has a normal mood and affect. Her behavior is normal. Thought content normal.    CMP Latest Ref Rng & Units 05/19/2018  Glucose 70 - 99 mg/dL 133(H)  BUN 6 - 20 mg/dL 25(H)  Creatinine 0.44 - 1.00 mg/dL 0.79  Sodium 135 - 145 mmol/L 136  Potassium 3.5 - 5.1 mmol/L 3.8  Chloride 98 - 111 mmol/L 101  CO2 22 - 32 mmol/L 24  Calcium 8.9 - 10.3 mg/dL 9.1  Total Protein 6.5 - 8.1 g/dL 7.8  Total Bilirubin 0.3 - 1.2 mg/dL 0.4  Alkaline Phos 38 - 126 U/L 71  AST 15 - 41 U/L 21  ALT 0 - 44 U/L 28   CBC Latest Ref Rng & Units 06/04/2018  WBC 4.0 - 10.5 K/uL 14.7(H)  Hemoglobin 12.0 - 15.0 g/dL 14.3  Hematocrit 36.0 - 46.0 % 45.2  Platelets 150 - 400 K/uL 287   RADIOGRAPHIC STUDIES: I have personally reviewed the radiological images as listed and agreed with the findings in the report.  Mm 3d Screen Breast Bilateral  Result Date: 06/04/2018 CLINICAL DATA:  Screening. EXAM: DIGITAL SCREENING BILATERAL MAMMOGRAM WITH TOMO AND CAD COMPARISON:  None. ACR Breast Density Category b: There are scattered areas of fibroglandular density. FINDINGS: In the left breast, a possible asymmetry warrants further evaluation. In the right breast, no findings suspicious for malignancy. Images were processed with CAD. IMPRESSION: Further evaluation is suggested for possible asymmetry in the left breast. RECOMMENDATION: Diagnostic mammogram and possibly ultrasound of the left breast. (Code:[id number]) The patient will be contacted regarding the findings, and additional imaging will be scheduled. BI-RADS CATEGORY  0: Incomplete. Need additional imaging evaluation and/or prior mammograms for comparison. Electronically Signed   By:  Ammie Ferrier M.D.   On: 06/04/2018 09:31    LABORATORY DATA:  I have reviewed the data as listed Lab Results  Component Value Date   WBC 14.7 (H) 06/04/2018   HGB 14.3 06/04/2018   HCT 45.2 06/04/2018   MCV 86.1 06/04/2018   PLT 287 06/04/2018   Recent Labs    05/19/18 1635  NA 136  K 3.8  CL 101  CO2 24  GLUCOSE 133*  BUN 25*  CREATININE 0.79  CALCIUM 9.1  GFRNONAA >60  GFRAA >60  PROT 7.8  ALBUMIN 4.4  AST 21  ALT 28  ALKPHOS 71  BILITOT 0.4   Iron/TIBC/Ferritin/ %Sat No results found for: IRON, TIBC, FERRITIN, IRONPCTSAT      ASSESSMENT & PLAN:  1. Leukocytosis, unspecified type   2. Easy bruising   3. Abnormal mammogram    Labs reviewed and discussed with patient that Leukocytosis, predominantly neutrophilia, can be secondary to infection, chronic inflammation, smoking, autoimmune disease, or underlying bone marrow disorders.   Given her chronic steroid use, I suspect that her leukocytosis likely secondary to steroid use. I will repeat CBC, smear and check flow cytometry today.  Hold other testing including Jak 2 and BCR ABL. #Easy bruising, check PT and PTT She just had screening mammogram done.  Images reviewed and discussed with patient.  I noticed that she had an asymmetric area which warrants further evaluation by diagnostic mammogram.  Advised patient to follow-up with primary care physician and get a diagnostic mammogram done.  Should she need any oncology care in the future I will be happy to take care of that.   Orders Placed This Encounter  Procedures  . Technologist smear review    Standing Status:   Future    Number of Occurrences:   1    Standing Expiration Date:   06/05/2019  . Flow cytometry panel-leukemia/lymphoma work-up    Standing Status:   Future    Number of Occurrences:   1    Standing Expiration Date:   06/05/2019  . CBC with Differential/Platelet    Standing Status:   Future    Number of Occurrences:   1    Standing  Expiration Date:   06/05/2019  . Protime-INR    Standing Status:   Future    Number of Occurrences:  1    Standing Expiration Date:   06/04/2019  . APTT    Standing Status:   Future    Number of Occurrences:   1    Standing Expiration Date:   06/05/2019    All questions were answered. The patient knows to call the clinic with any problems questions or concerns.  Return of visit: 4 weeks to discuss labs. Thank you for this kind referral and the opportunity to participate in the care of this patient. A copy of today's note is routed to referring provider  Total face to face encounter time for this patient visit was 45 min. >50% of the time was  spent in counseling and coordination of care.    Earlie Server, MD, PhD Hematology Oncology [facility] Pager- [phone] 06/04/2018

## 2018-06-04 NOTE — Progress Notes (Signed)
Patient here today as a new patient with leukocytosis.

## 2018-06-10 ENCOUNTER — Ambulatory Visit
Admission: RE | Admit: 2018-06-10 | Discharge: 2018-06-10 | Disposition: A | Discharge status: Home / Self Care | Payer: BLUE CROSS/BLUE SHIELD | Source: Ambulatory Visit | Attending: Internal Medicine | Admitting: Internal Medicine

## 2018-06-10 DIAGNOSIS — R928 Other abnormal and inconclusive findings on diagnostic imaging of breast: Secondary | ICD-10-CM | POA: Diagnosis not present

## 2018-06-10 DIAGNOSIS — N6489 Other specified disorders of breast: Secondary | ICD-10-CM | POA: Diagnosis not present

## 2018-06-10 LAB — COMP PANEL: LEUKEMIA/LYMPHOMA

## 2018-06-22 ENCOUNTER — Ambulatory Visit
Admission: EM | Admit: 2018-06-22 | Discharge: 2018-06-22 | Disposition: A | Discharge status: Home / Self Care | Payer: Worker's Compensation | Attending: Family Medicine | Admitting: Family Medicine

## 2018-06-22 ENCOUNTER — Other Ambulatory Visit: Payer: Self-pay

## 2018-06-22 ENCOUNTER — Ambulatory Visit (INDEPENDENT_AMBULATORY_CARE_PROVIDER_SITE_OTHER): Payer: Worker's Compensation

## 2018-06-22 ENCOUNTER — Encounter: Payer: Self-pay | Admitting: Emergency Medicine

## 2018-06-22 DIAGNOSIS — S6992XD Unspecified injury of left wrist, hand and finger(s), subsequent encounter: Secondary | ICD-10-CM

## 2018-06-22 DIAGNOSIS — M25542 Pain in joints of left hand: Secondary | ICD-10-CM | POA: Diagnosis not present

## 2018-06-22 DIAGNOSIS — X58XXXA Exposure to other specified factors, initial encounter: Secondary | ICD-10-CM

## 2018-06-22 DIAGNOSIS — M79645 Pain in left finger(s): Secondary | ICD-10-CM

## 2018-06-22 NOTE — Discharge Instructions (Signed)
Continue take over-the-counter ibuprofen as needed.  Keep left fourth and fifth fingers buddy taped for support.  Follow-up in 4 to 5 days with occupational health, see above.  Call to schedule this.  Return to urgent care as needed.

## 2018-06-22 NOTE — ED Provider Notes (Signed)
[facility] ____________________________________________  Time seen: Approximately 10:42 AM  I have reviewed the triage vital signs and the nursing notes.   HISTORY  Chief Complaint Hand Pain (left pinky WC (DOA 06/15/18))   HPI Tina Carrillo is a 47 y.o. female presenting for evaluation of left fifth digit pain after injury that occurred at work 1 week ago.  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  States that she has been seen twice by urgent care in [place], however reports was sent here today because she was sent to a wrong urgent care for [employer].  States initial history was from lifting approximately 40 pound box and it bent left fifth finger back and sideways causing pain and injury.  States that they did an x-ray and was told her it was negative for break to the fifth left digit.  States initially was placed and a finger splint to the left finger alone but was not buddy taped and states that this increased the pain, and she was then seen 2 days afterwards and then had splint removed and buddy taped to fourth and fifth digit.  States here today as the pain continues and work wanted her to be seen by their Worker's Comp. group.  Has occasionally taken over-the-counter ibuprofen.  States pain is predominantly with bending where she accidentally hits the area.  States unable to make a full fist.  Denies any paresthesias, pain radiation or other injuries.  Denies other complaints.  Reports otherwise doing well. Denies recent sickness. Denies recent antibiotic use.    Past Medical History:  Diagnosis Date  . Adrenal benign tumor   . Adrenal hyperplasia [facility])     Patient Active Problem List   Diagnosis Date Noted  . Vaginal agenesis, partial 05/19/2018  . Mood disorder ([place]) 12/01/2017  . Adjustment insomnia 12/01/2017  . Adrenal mass ([place]) 05/08/2016  . Type 2 diabetes mellitus without complication, without long-term current use of insulin ([place]) 10/17/2015    . Environmental and seasonal allergies 06/14/2015  . Congenital adrenal hyperplasia ([place]) 08/16/2014    Past Surgical History:  Procedure Laterality Date  . CHOLECYSTECTOMY    . CLEFT PALATE REPAIR    . VAGINA RECONSTRUCTION SURGERY  1985     No current facility-administered medications for this encounter.   Current Outpatient Medications:  .  fludrocortisone (FLORINEF) 0.1 MG tablet, Take 0.1 mg by mouth daily., Disp: , Rfl:  .  ibuprofen (ADVIL,MOTRIN) 200 MG tablet, Take 200 mg by mouth every 6 (six) hours as needed., Disp: , Rfl:  .  predniSONE (DELTASONE) 10 MG tablet, Take 20 mg by mouth 2 (two) times daily with a meal. , Disp: , Rfl:  .  traZODone (DESYREL) 50 MG tablet, TAKE 0.5-1 TABLETS (25-50 MG TOTAL) BY MOUTH AT BEDTIME AS NEEDED FOR SLEEP., Disp: 90 tablet, Rfl: 1  Allergies Benzodiazepines; Tramadol; Morphine and related; Nucynta [tapentadol]; and Oxycodone  Family History  Problem Relation Age of Onset  . Ovarian cancer Mother   . Bipolar disorder Mother   . Diabetes Mother   . Uterine cancer Sister   . Breast cancer Sister 68       mat half sister  . Hypertension Father   . Heart disease Father   . Diabetes Maternal Grandmother   . Brain cancer Paternal Grandmother     Social History Social History   Tobacco Use  . Smoking status: Current Some Day Smoker    Packs/day: 0.25    Years: 1.00  Pack years: 0.25    Types: Cigarettes    Start date: 03/17/2017  . Smokeless tobacco: Never Used  Substance Use Topics  . Alcohol use: Yes    Alcohol/week: 1.0 standard drinks    Types: 1 Cans of beer per week  . Drug use: No    Review of Systems Constitutional: No fever/chills Cardiovascular: Denies chest pain. Respiratory: Denies shortness of breath. Gastrointestinal: No abdominal pain. Musculoskeletal: Negative for back pain. As above.  Skin: Negative for rash.   ____________________________________________   PHYSICAL EXAM:  VITAL SIGNS: ED  Triage Vitals  Enc Vitals Group     BP 06/22/18 0949 140/90     Pulse Rate 06/22/18 0949 87     Resp 06/22/18 0949 17     Temp 06/22/18 0949 98.3 F (36.8 C)     Temp Source 06/22/18 0949 Oral     SpO2 06/22/18 0949 100 %     Weight 06/22/18 0948 170 lb (77.1 kg)     Height 06/22/18 0948 4' 11"  (1.499 m)     Head Circumference --      Peak Flow --      Pain Score 06/22/18 0948 5     Pain Loc --      Pain Edu? --      Excl. in [place]? --     Constitutional: Alert and oriented. Well appearing and in no acute distress. ENT      Head: Normocephalic and atraumatic. Cardiovascular: Normal rate, regular rhythm. Grossly normal heart sounds.  Good peripheral circulation. Respiratory: Normal respiratory effort without tachypnea nor retractions. Breath sounds are clear and equal bilaterally. No wheezes, rales, rhonchi. Musculoskeletal: Steady gait. Except: Left hand proximal phalanx and MCP joint mild to moderate tenderness to palpation, more tender on the volar aspect of proximal phalanx, no swelling, normal distal sensation, no ecchymosis, slightly limited flexion to fifth digit at PIP joint and MCP joint, good resisted flexion and extension but pain with resisted flexion.  Left hand otherwise nontender with full range of motion present. Neurologic:  Normal speech and language.Speech is normal. No gait instability.  Skin:  Skin is warm, dry  Psychiatric: Mood and affect are normal. Speech and behavior are normal. Patient exhibits appropriate insight and judgment   ___________________________________________   LABS (all labs ordered are listed, but only abnormal results are displayed)  Labs Reviewed - No data to display ____________________________________________  RADIOLOGY  Dg Finger Little Left  Result Date: 06/22/2018 CLINICAL DATA:  Hyper extended finger at work last week now with pain primarily involving the fifth MCP joint and proximal phalanx. EXAM: LEFT LITTLE FINGER 2+V  COMPARISON:  None. FINDINGS: No fracture or dislocation with special attention paid to proximal phalanx and MCP joint of the fifth digit. Joint spaces are preserved. No erosions. Regional soft tissues appear normal. No radiopaque foreign body. IMPRESSION: No fracture, dislocation or radiopaque foreign body. Electronically Signed   By: Sandi Mariscal M.D.   On: 06/22/2018 10:35   ____________________________________________   PROCEDURES Procedures   INITIAL IMPRESSION / ASSESSMENT AND PLAN / ED COURSE  Pertinent labs & imaging results that were available during my care of the patient were reviewed by me and considered in my medical decision making (see chart for details).  Well-appearing patient.  Left finger fifth digit pain post injury that occurred at work [employer] injury.  Unable to visualize imaging previously obtained.  Xray. Left fifth digit x-ray as above, no fracture dislocation radiographs foreign body per radiologist  and reviewed by myself.  Suspect sprain injury.  Continue supportive care with buddy taping to fourth and fifth digit.  Patient reports she does a lot of hand movements at work repetitively as well as lifting.  Discussed no heavy lifting greater than 5 pounds with left hand this week and recommend follow-up in 4 to 5 days with occupational health, information given and directed to call.  Over-the-counter ibuprofen as needed.  Discussed stretching hand, range of motion exercises.  Discussed follow up and return parameters including no resolution or any worsening concerns. Patient verbalized understanding and agreed to plan.   ____________________________________________   FINAL CLINICAL IMPRESSION(S) / ED DIAGNOSES  Final diagnoses:  Finger injury, left, subsequent encounter  Pain in finger of left hand     ED Discharge Orders    None       Note: This dictation was prepared with Dragon dictation along with smaller phrase technology. Any  transcriptional errors that result from this process are unintentional.         Marylene Land, NP 06/22/18 1809

## 2018-06-22 NOTE — ED Triage Notes (Signed)
Pt was at work pick up a box of oil and bend her left pinky backwards. She can not make a fist with her left hand. She was sent to the wrong urgent care by her employer where they did x-rays and ruled out a fracture. They put a splint on It but pt say it made it worse. Pt employer made her come back here today because they could not accept the paper work from the other urgent care.

## 2018-07-02 ENCOUNTER — Ambulatory Visit: Payer: BLUE CROSS/BLUE SHIELD | Admitting: Oncology

## 2018-08-03 ENCOUNTER — Telehealth: Payer: Self-pay

## 2018-08-03 NOTE — Telephone Encounter (Signed)
Patient called stating she has been vomiting and having diarrhea. Wanted to know if we could call in nausea meds to help.  Spoke with Dr Army Melia and informed patient of her instructions to go to the ER for fluids. Told her Dr Army Melia is concerned she will get dehydrated based off her past medical history. Told her to go to the ER. She verbalized understanding.

## 2018-09-08 ENCOUNTER — Other Ambulatory Visit
Admission: RE | Admit: 2018-09-08 | Discharge: 2018-09-08 | Disposition: A | Discharge status: Home / Self Care | Payer: BLUE CROSS/BLUE SHIELD | Attending: Internal Medicine | Admitting: Internal Medicine

## 2018-09-08 ENCOUNTER — Encounter: Payer: Self-pay | Admitting: Internal Medicine

## 2018-09-08 ENCOUNTER — Ambulatory Visit (INDEPENDENT_AMBULATORY_CARE_PROVIDER_SITE_OTHER): Payer: BLUE CROSS/BLUE SHIELD | Admitting: Internal Medicine

## 2018-09-08 VITALS — BP 126/74 | HR 90 | Ht 59.0 in | Wt 183.0 lb

## 2018-09-08 DIAGNOSIS — F5102 Adjustment insomnia: Secondary | ICD-10-CM | POA: Insufficient documentation

## 2018-09-08 DIAGNOSIS — T148XXA Other injury of unspecified body region, initial encounter: Secondary | ICD-10-CM | POA: Insufficient documentation

## 2018-09-08 DIAGNOSIS — E119 Type 2 diabetes mellitus without complications: Secondary | ICD-10-CM

## 2018-09-08 DIAGNOSIS — X58XXXA Exposure to other specified factors, initial encounter: Secondary | ICD-10-CM | POA: Insufficient documentation

## 2018-09-08 DIAGNOSIS — R1013 Epigastric pain: Secondary | ICD-10-CM | POA: Diagnosis not present

## 2018-09-08 LAB — CBC WITH DIFFERENTIAL/PLATELET
Abs Immature Granulocytes: 0.12 10*3/uL — ABNORMAL HIGH (ref 0.00–0.07)
Basophils Absolute: 0 10*3/uL (ref 0.0–0.1)
Basophils Relative: 0 %
Eosinophils Absolute: 0.1 10*3/uL (ref 0.0–0.5)
Eosinophils Relative: 1 %
HCT: 44.6 % (ref 36.0–46.0)
Hemoglobin: 14.5 g/dL (ref 12.0–15.0)
Immature Granulocytes: 1 %
Lymphocytes Relative: 16 %
Lymphs Abs: 2.5 10*3/uL (ref 0.7–4.0)
MCH: 28.2 pg (ref 26.0–34.0)
MCHC: 32.5 g/dL (ref 30.0–36.0)
MCV: 86.6 fL (ref 80.0–100.0)
Monocytes Absolute: 0.8 10*3/uL (ref 0.1–1.0)
Monocytes Relative: 5 %
Neutro Abs: 11.6 10*3/uL — ABNORMAL HIGH (ref 1.7–7.7)
Neutrophils Relative %: 77 %
Platelets: 295 10*3/uL (ref 150–400)
RBC: 5.15 MIL/uL — ABNORMAL HIGH (ref 3.87–5.11)
RDW: 14.1 % (ref 11.5–15.5)
WBC: 15.1 10*3/uL — ABNORMAL HIGH (ref 4.0–10.5)
nRBC: 0 % (ref 0.0–0.2)

## 2018-09-08 LAB — TSH: TSH: 0.567 u[IU]/mL (ref 0.350–4.500)

## 2018-09-08 LAB — BASIC METABOLIC PANEL
Anion gap: 10 (ref 5–15)
BUN: 17 mg/dL (ref 6–20)
CO2: 28 mmol/L (ref 22–32)
Calcium: 9.4 mg/dL (ref 8.9–10.3)
Chloride: 102 mmol/L (ref 98–111)
Creatinine, Ser: 0.75 mg/dL (ref 0.44–1.00)
GFR calc Af Amer: >60 mL/min (ref >60)
GFR calc non Af Amer: >60 mL/min (ref >60)
Glucose, Bld: 146 mg/dL — ABNORMAL HIGH (ref 70–99)
Potassium: 3.8 mmol/L (ref 3.5–5.1)
Sodium: 140 mmol/L (ref 135–145)

## 2018-09-08 NOTE — Progress Notes (Signed)
Date:  09/08/2018   Name:  Tina Carrillo   DOB:  04-22-1971   MRN:  [id number]   Chief Complaint: Fatigue (X 2 weeks. Not sleeping well at night. Trazadone no help. Insomnia is interfering with work. 3 hours at night.) and Bleeding/Bruising (Bruising very easily. Concerned about Anemia.)  Insomnia  The current episode started more than one month. The onset quality is undetermined. The problem occurs nightly. The problem is unchanged. The symptoms are aggravated by family issues and emotional upset. Treatments tried: trazodone - but leaves her sedated the next day.  Diabetes  She presents for her follow-up diabetic visit. She has type 2 diabetes mellitus. Pertinent negatives for hypoglycemia include no dizziness, headaches or seizures. Associated symptoms include fatigue. Pertinent negatives for diabetes include no chest pain and no weakness. When asked about current treatments, none were reported. She is following a generally unhealthy diet. She has had a previous visit with a dietitian. There is no compliance with monitoring of blood glucose. An ACE inhibitor/angiotensin II receptor blocker is not being taken.  Abdominal Pain  This is a recurrent problem. The problem occurs every several days. The problem has been unchanged. The pain is located in the epigastric region and RUQ. The pain is mild. The quality of the pain is colicky and cramping. Pertinent negatives include no constipation, diarrhea, dysuria, fever, headaches, nausea or vomiting. She has tried nothing for the symptoms.  Bruising - scattered small bruises intermittently over arms and hands.  No other areas of the body.  Concerned about anemia or blood disorder.   Review of Systems  Constitutional: Positive for fatigue and unexpected weight change. Negative for chills, diaphoresis and fever.  Respiratory: Negative for cough, chest tightness, shortness of breath and wheezing.   Cardiovascular: Negative for chest pain and  palpitations.  Gastrointestinal: Positive for abdominal pain. Negative for constipation, diarrhea, nausea and vomiting.  Genitourinary: Negative for dysuria.  Neurological: Negative for dizziness, seizures, weakness, numbness and headaches.  Hematological: Bruises/bleeds easily.  Psychiatric/Behavioral: The patient has insomnia.     Patient Active Problem List   Diagnosis Date Noted  . Vaginal agenesis, partial 05/19/2018  . Mood disorder ([place]) 12/01/2017  . Adjustment insomnia 12/01/2017  . Adrenal mass ([place]) 05/08/2016  . Type 2 diabetes mellitus without complication, without long-term current use of insulin ([place]) 10/17/2015  . Environmental and seasonal allergies 06/14/2015  . Congenital adrenal hyperplasia ([place]) 08/16/2014    Allergies  Allergen Reactions  . Benzodiazepines Hives  . Tramadol Nausea And Vomiting  . Morphine And Related Nausea And Vomiting  . Nucynta [Tapentadol] Nausea And Vomiting  . Oxycodone Itching    Past Surgical History:  Procedure Laterality Date  . CHOLECYSTECTOMY    . CLEFT PALATE REPAIR    . VAGINA RECONSTRUCTION SURGERY  1985    Social History   Tobacco Use  . Smoking status: Current Some Day Smoker    Packs/day: 0.25    Years: 1.00    Pack years: 0.25    Types: Cigarettes    Start date: 03/17/2017  . Smokeless tobacco: Never Used  Substance Use Topics  . Alcohol use: Yes    Alcohol/week: 1.0 standard drinks    Types: 1 Cans of beer per week  . Drug use: No     Medication list has been reviewed and updated.  Current Meds  Medication Sig  . fludrocortisone (FLORINEF) 0.1 MG tablet Take 0.1 mg by mouth daily. Lifetime.  Marland Kitchen ibuprofen (ADVIL,MOTRIN) 200 MG  tablet Take 200 mg by mouth every 6 (six) hours as needed.  . predniSONE (DELTASONE) 10 MG tablet Take 20 mg by mouth 2 (two) times daily with a meal. Lifetime.  . traZODone (DESYREL) 50 MG tablet TAKE 0.5-1 TABLETS (25-50 MG TOTAL) BY MOUTH AT BEDTIME AS NEEDED FOR SLEEP.     PHQ 2/9 Scores 02/09/2018 12/01/2017  PHQ - 2 Score 0 6  PHQ- 9 Score - 20   Wt Readings from Last 3 Encounters:  09/08/18 183 lb (83 kg)  06/22/18 170 lb (77.1 kg)  06/04/18 178 lb 14.5 oz (81.1 kg)    Physical Exam Vitals signs and nursing note reviewed.  Constitutional:      General: She is not in acute distress.    Appearance: She is well-developed.  HENT:     Head: Normocephalic and atraumatic.  Neck:     Musculoskeletal: Normal range of motion and neck supple.  Cardiovascular:     Rate and Rhythm: Normal rate and regular rhythm.  Pulmonary:     Effort: Pulmonary effort is normal. No respiratory distress.     Breath sounds: Normal breath sounds.  Abdominal:     General: Abdomen is flat. Bowel sounds are normal.     Palpations: Abdomen is soft.     Tenderness: There is abdominal tenderness in the right upper quadrant and epigastric area. There is no guarding or rebound.  Musculoskeletal: Normal range of motion.  Lymphadenopathy:     Cervical: No cervical adenopathy.  Skin:    General: Skin is warm and dry.     Findings: No rash.  Neurological:     Mental Status: She is alert and oriented to person, place, and time.  Psychiatric:        Attention and Perception: Attention normal.        Mood and Affect: Mood is depressed (tearful).        Speech: Speech normal.        Behavior: Behavior normal.        Thought Content: Thought content normal.        Cognition and Memory: Cognition normal.        Judgment: Judgment normal.     BP 126/74 (BP Location: Right Arm, Patient Position: Sitting, Cuff Size: Normal)   Pulse 90   Ht 4' 11"  (1.499 m)   Wt 183 lb (83 kg)   SpO2 99%   BMI 36.96 kg/m   Assessment and Plan: 1. Type 2 diabetes mellitus without complication, without long-term current use of insulin ([place]) Encouraged pt to work on diet and follow up with Endo - Basic metabolic panel  2. Adjustment insomnia Check labs Trial of Belsomra 15 mg - TSH  3.  Bruising - CBC with Differential/Platelet  4. Epigastric discomfort Begin Pepcid 20 mg daily   Partially dictated using Editor, commissioning. Any errors are unintentional.  Halina Maidens, MD [facility]  09/08/2018

## 2018-09-08 NOTE — Patient Instructions (Signed)
Take Pepcid daily for at least one month

## 2018-09-10 NOTE — Progress Notes (Signed)
UNABLE to add lab. Patient went to [facility] for labs being done. Called and left her VM. Awaiting call back to find out if she was fasting.

## 2019-02-01 ENCOUNTER — Other Ambulatory Visit: Payer: Self-pay | Admitting: Internal Medicine

## 2019-02-01 DIAGNOSIS — Z1231 Encounter for screening mammogram for malignant neoplasm of breast: Secondary | ICD-10-CM

## 2019-02-03 ENCOUNTER — Telehealth: Payer: Self-pay

## 2019-02-03 NOTE — Telephone Encounter (Signed)
[place] told patient that PCP needs to order Diagnostic Mammo of L Breast  due to Abnormal L breast on previous Mammo.Patient  requested order Diagnostic on L and R Breast but [facility] denied request. Advised orders need to be changed by PCP who decides if we can do  L and R and [facility] will contact PCP  If changes needed to orders. Patient asks that we please order Bilateral Diagnostic. Wants updated when orders are made.

## 2019-02-03 NOTE — Telephone Encounter (Signed)
Unfortunately, it is not up to me what type of mammogram she can get.  [facility] makes the call on that and they usually will send me orders to sign if changes are needed.  So, I will do nothing at this point and just wait for new orders from [facility].

## 2019-02-05 NOTE — Telephone Encounter (Signed)
Spoke to patient after speaking to [place] and explained she was due a follow up L Breast Unilateral Diagnostic and U/S a few months ago and that is why the focus will be on L breast only for that visit. Her yearly Screening may not need to be L Diagnostic if U/S and follow up are normal. If she waits until after June 04 2019 she can automatically have the Bilateral Diagnostic Mammogram. Patient will call Melissa at [facility] to set up L breast follow up and determine what kind of Mammogram is needed later.

## 2019-02-09 ENCOUNTER — Other Ambulatory Visit: Payer: Self-pay | Admitting: Internal Medicine

## 2019-02-09 DIAGNOSIS — R928 Other abnormal and inconclusive findings on diagnostic imaging of breast: Secondary | ICD-10-CM

## 2019-02-12 IMAGING — CR DG FINGER LITTLE 2+V*L*
3 series · 3 of 3 positions shown · non-contrast
Comparison: None.

CLINICAL DATA: Hyper extended finger at work last week now with
pain primarily involving the fifth MCP joint and proximal phalanx.

EXAM:
LEFT LITTLE FINGER 2+V

[finger ap]
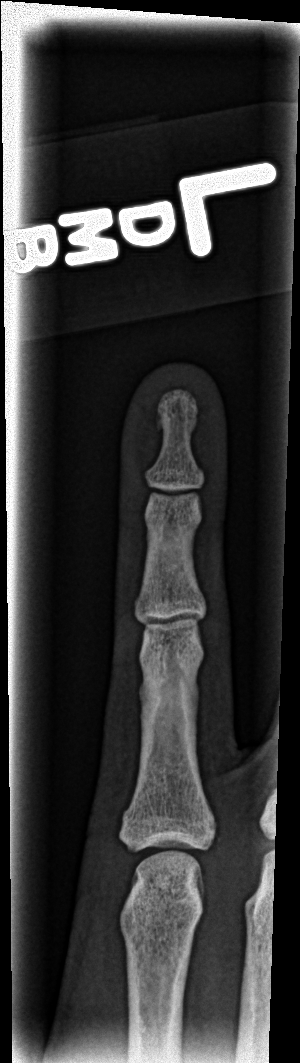

[finger obl]
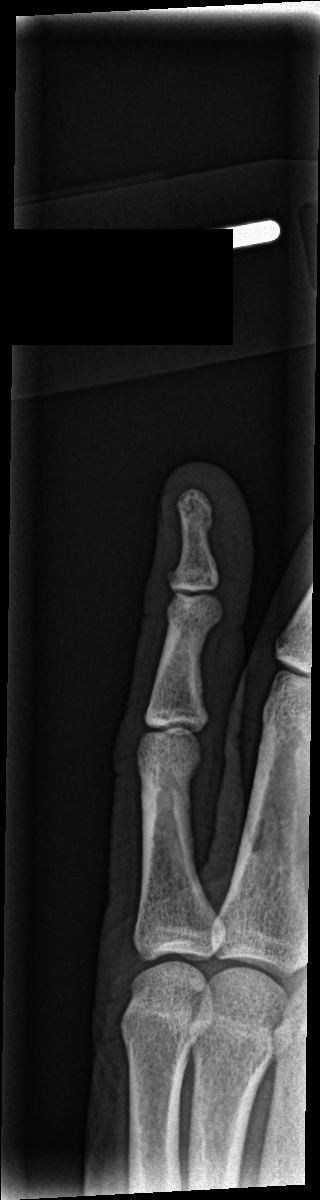

[finger lat]
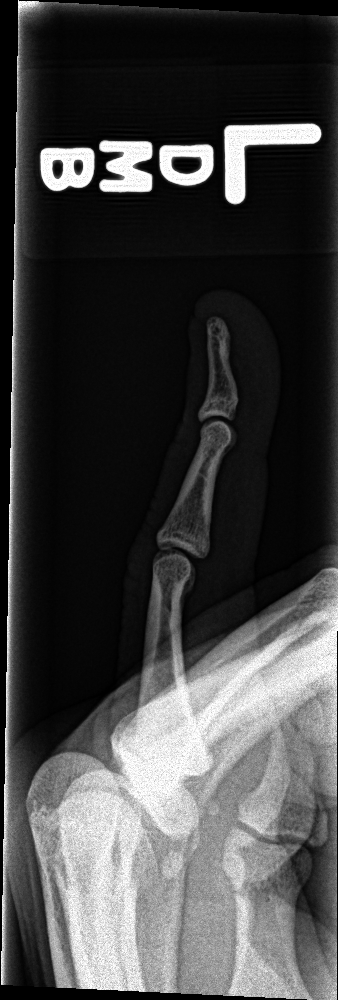

[3 of 3 positions shown; findings below may reference images not displayed]

FINDINGS: No fracture or dislocation with special attention paid to proximal
phalanx and MCP joint of the fifth digit. Joint spaces are
preserved. No erosions. Regional soft tissues appear normal. No
radiopaque foreign body.
IMPRESSION: No fracture, dislocation or radiopaque foreign body.

## 2019-03-03 ENCOUNTER — Other Ambulatory Visit: Payer: Self-pay

## 2019-03-03 ENCOUNTER — Ambulatory Visit
Admission: RE | Admit: 2019-03-03 | Discharge: 2019-03-03 | Disposition: A | Discharge status: Home / Self Care | Payer: Self-pay | Source: Ambulatory Visit | Attending: Internal Medicine | Admitting: Internal Medicine

## 2019-03-03 DIAGNOSIS — R928 Other abnormal and inconclusive findings on diagnostic imaging of breast: Secondary | ICD-10-CM | POA: Insufficient documentation

## 2019-03-04 ENCOUNTER — Other Ambulatory Visit: Payer: Self-pay | Admitting: Internal Medicine

## 2019-03-04 DIAGNOSIS — R928 Other abnormal and inconclusive findings on diagnostic imaging of breast: Secondary | ICD-10-CM

## 2019-09-07 ENCOUNTER — Other Ambulatory Visit: Payer: Self-pay

## 2019-09-17 ENCOUNTER — Other Ambulatory Visit: Payer: Self-pay

## 2019-09-17 ENCOUNTER — Ambulatory Visit
Admission: RE | Admit: 2019-09-17 | Discharge: 2019-09-17 | Disposition: A | Discharge status: Home / Self Care | Payer: BLUE CROSS/BLUE SHIELD | Source: Ambulatory Visit | Attending: Internal Medicine | Admitting: Internal Medicine

## 2019-09-17 DIAGNOSIS — R928 Other abnormal and inconclusive findings on diagnostic imaging of breast: Secondary | ICD-10-CM | POA: Insufficient documentation

## 2019-10-24 IMAGING — MG DIGITAL DIAGNOSTIC UNILATERAL LEFT MAMMOGRAM WITH TOMO AND CAD
8 of 14 series · 8 of 40 positions shown · non-contrast
Comparison: Previous exam(s).

CLINICAL DATA: First six-month follow-up of probably benign left
breast asymmetry.

EXAM:
DIGITAL DIAGNOSTIC UNILATERAL LEFT MAMMOGRAM WITH CAD AND TOMO

[L MLO synth-2D]
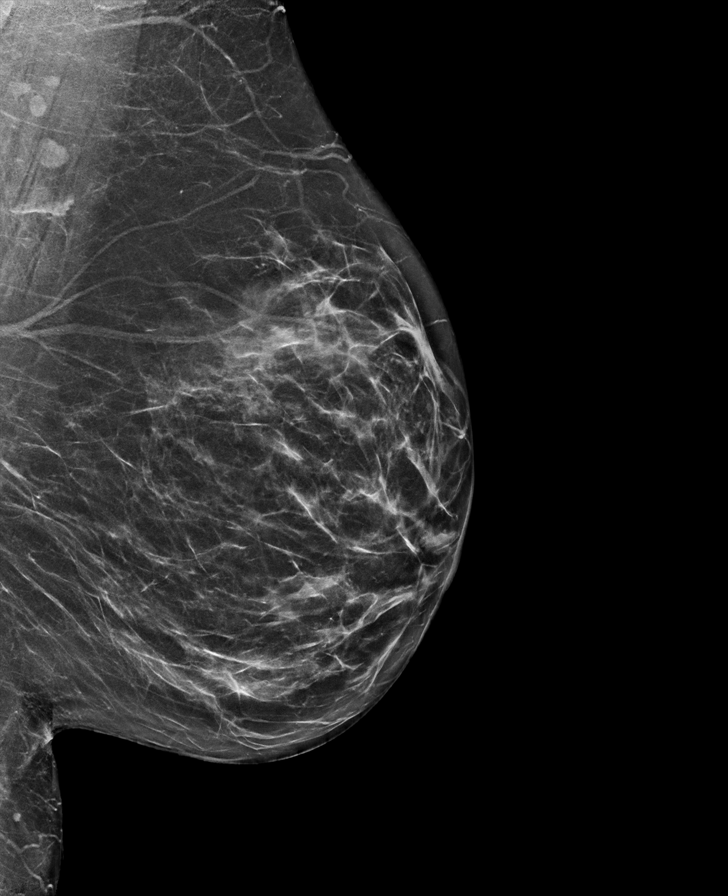

[L CC synth-2D (1 of 5)]
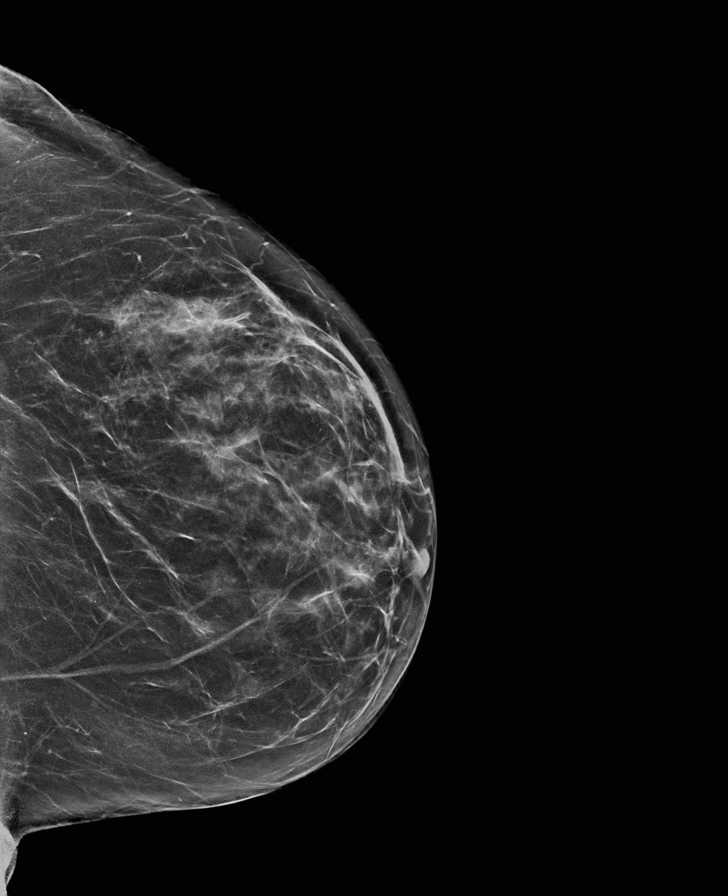

[L LM synth-2D]
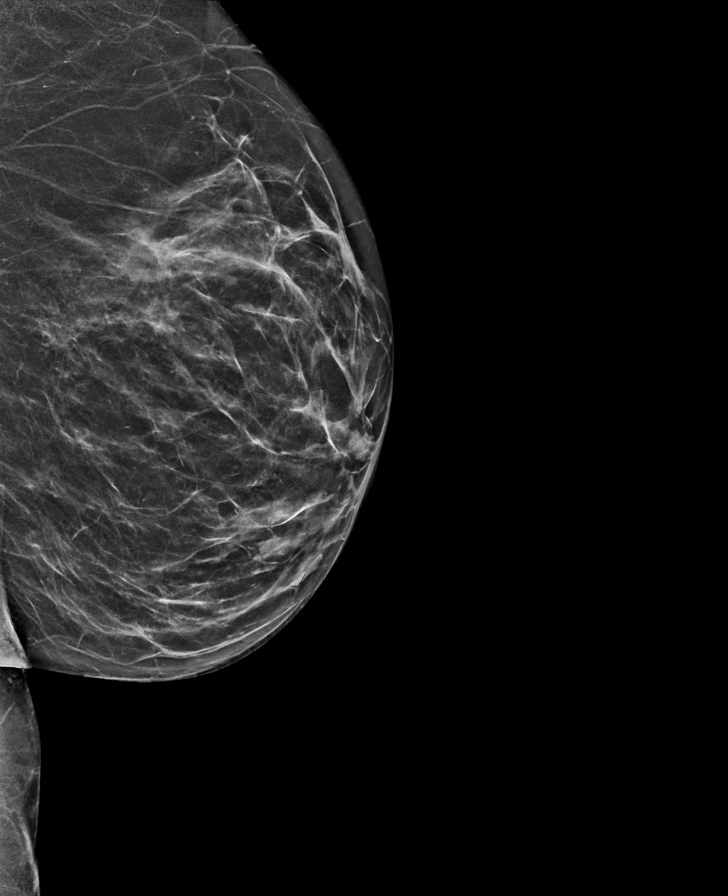

[L CC synth-2D (2 of 5)]
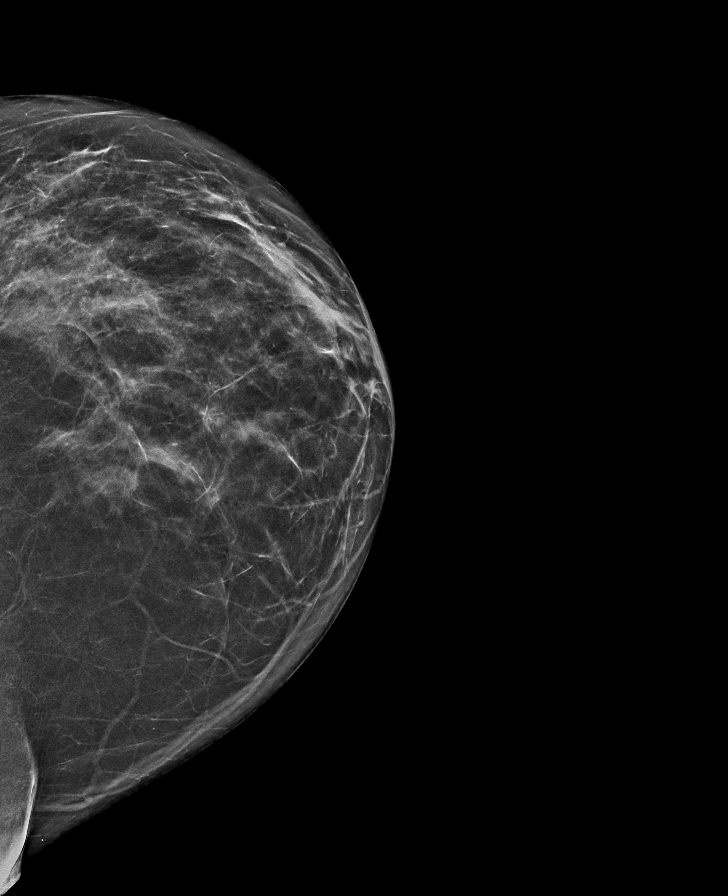

[L CC synth-2D (3 of 5)]
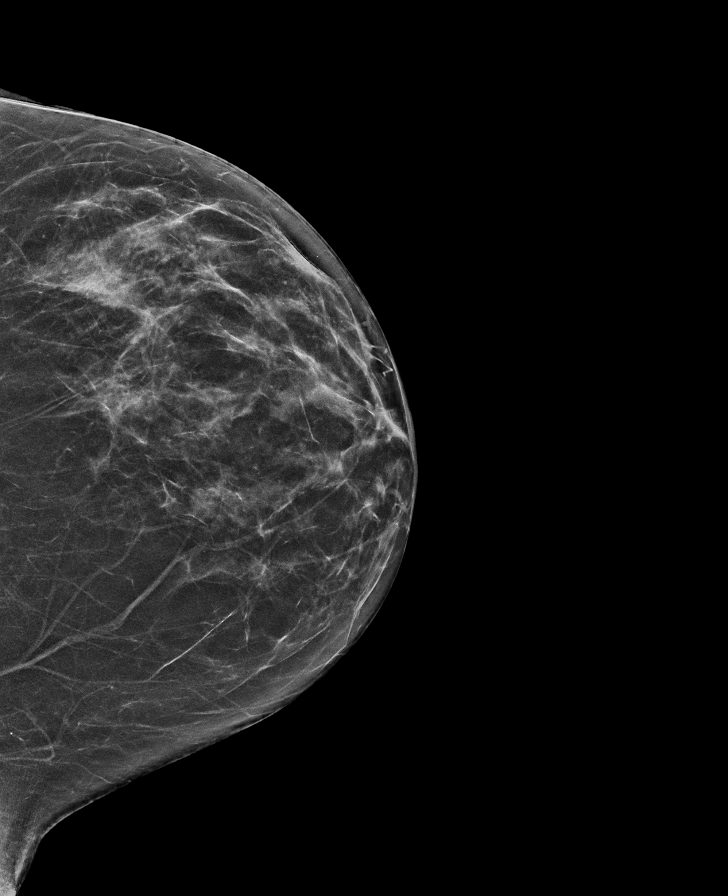

[L CC synth-2D (4 of 5)]
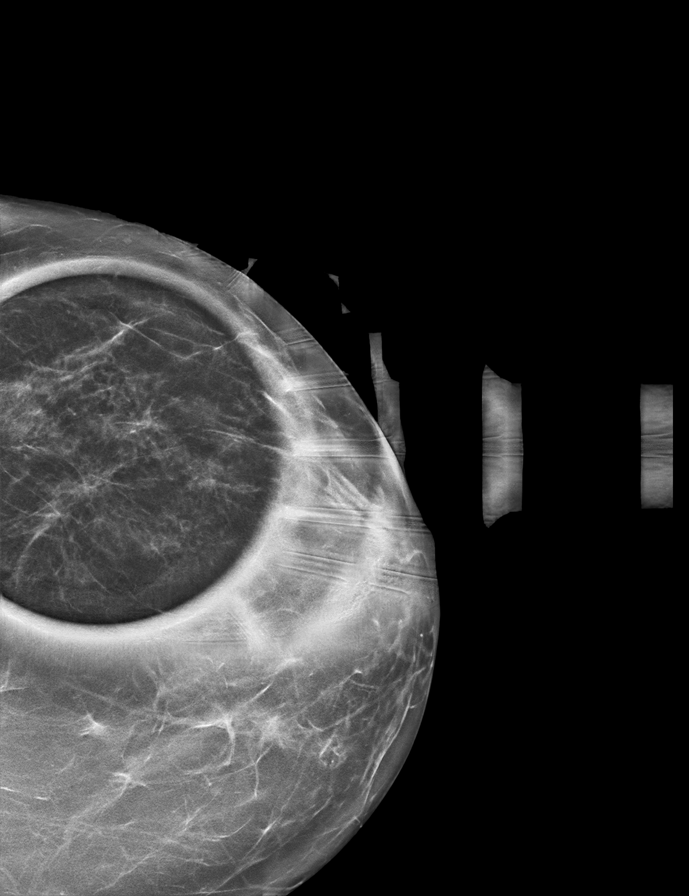

[L CC synth-2D (5 of 5)]
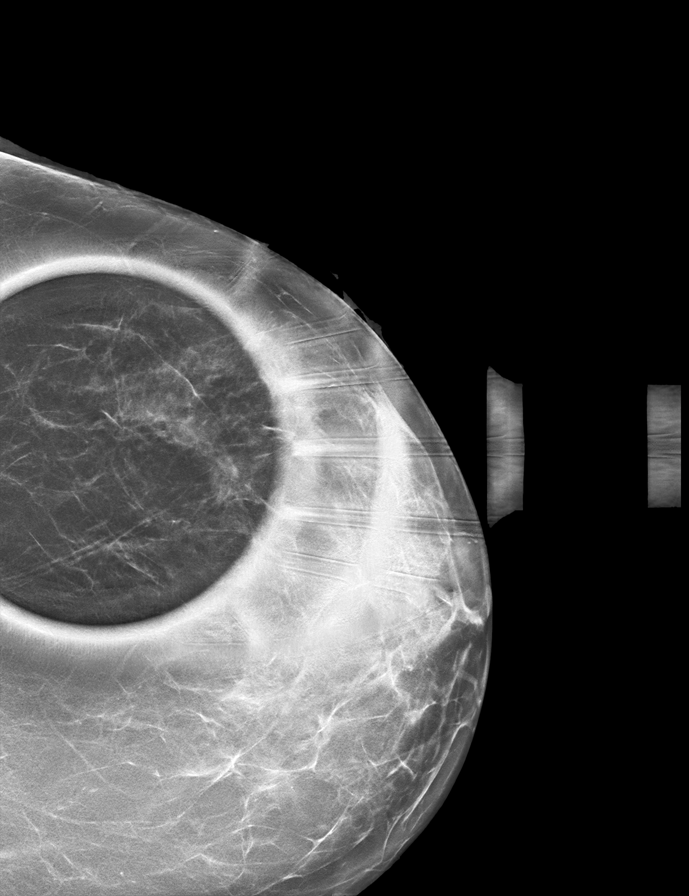

[L CC tomo · tomo slice 31/62.0]
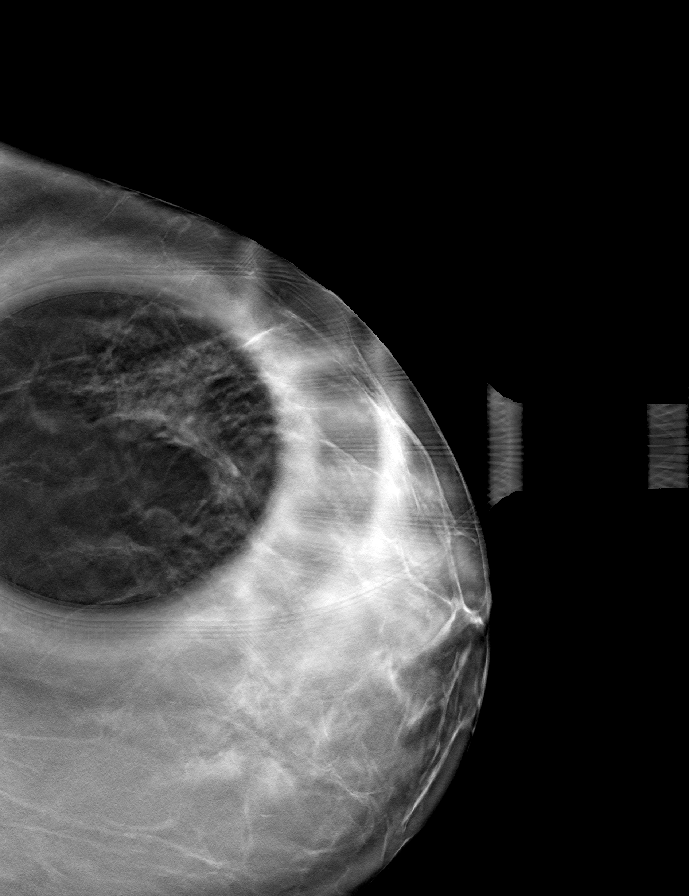

[8 of 40 positions shown; findings below may reference images not displayed]

ACR Breast Density Category b: There are scattered areas of
fibroglandular density.
FINDINGS: Standard and spot compression views of the left breast were
obtained. The previously seen non mass focal asymmetry in the left
breast upper outer quadrant is stable and continues to demonstrate
probably benign features.

Mammographic images were processed with CAD.
IMPRESSION: Stable probably benign left breast upper outer quadrant focal
asymmetry, for which continued short-term follow-up is recommended.

RECOMMENDATION:
Bilateral diagnostic mammogram, and possibly left breast ultrasound
in 6 months.

I have discussed the findings and recommendations with the patient.
Results were also provided in writing at the conclusion of the
visit. If applicable, a reminder letter will be sent to the patient
regarding the next appointment.

BI-RADS CATEGORY  3: Probably benign.

## 2019-10-26 ENCOUNTER — Ambulatory Visit (INDEPENDENT_AMBULATORY_CARE_PROVIDER_SITE_OTHER): Payer: BLUE CROSS/BLUE SHIELD

## 2019-10-26 ENCOUNTER — Other Ambulatory Visit: Payer: Self-pay

## 2019-10-26 ENCOUNTER — Ambulatory Visit (INDEPENDENT_AMBULATORY_CARE_PROVIDER_SITE_OTHER): Payer: BLUE CROSS/BLUE SHIELD | Admitting: Podiatry

## 2019-10-26 ENCOUNTER — Other Ambulatory Visit: Payer: Self-pay | Admitting: Podiatry

## 2019-10-26 DIAGNOSIS — M79672 Pain in left foot: Secondary | ICD-10-CM

## 2019-10-26 DIAGNOSIS — M722 Plantar fascial fibromatosis: Secondary | ICD-10-CM | POA: Diagnosis not present

## 2019-10-26 DIAGNOSIS — M79671 Pain in right foot: Secondary | ICD-10-CM

## 2019-10-27 ENCOUNTER — Encounter: Payer: Self-pay | Admitting: Podiatry

## 2019-10-27 NOTE — Progress Notes (Signed)
Subjective:  Patient ID: Tina Carrillo, female    DOB: Jun 22, 1971,  MRN: [id number]  Chief Complaint  Patient presents with  . Foot Pain    pt has bil heel/ and arche pain, pt states that the pain has been going on for about 2 weeks, pain is elevated when she is walking at work for 4 hours, pt puts pain scale as a 6 out of 103.    49 y.o. female presents with the above complaint. Patient presents with bilateral heel pain that has been going on for about 2 weeks. Patient states that they both hurt about the same. Patient states that there is aching and throbbing sensation associated with it. Patient states the pain is elevated when she is walking around at the job. She walks on a concrete floor. Pain scale 6 out of 10. She has not tried anything for this pain. She states that she is on systemic steroid. She denies any other acute complaints. She would like to know if there is anything that could be done for this. She does not want an injection today.   Review of Systems: Negative except as noted in the HPI. Denies N/V/F/Ch.  Past Medical History:  Diagnosis Date  . Adrenal benign tumor   . Adrenal hyperplasia ([place])     Current Outpatient Medications:  .  Blood Glucose Monitoring Suppl (FIFTY50 GLUCOSE METER 2.0) w/Device KIT, Use as directed, Disp: , Rfl:  .  fludrocortisone (FLORINEF) 0.1 MG tablet, Take 0.1 mg by mouth daily. Lifetime., Disp: , Rfl:  .  fluticasone (FLONASE) 50 MCG/ACT nasal spray, Place into the nose., Disp: , Rfl:  .  glucose blood (PRECISION QID TEST) test strip, Use once daily Use as instructed., Disp: , Rfl:  .  ibuprofen (ADVIL,MOTRIN) 200 MG tablet, Take 200 mg by mouth every 6 (six) hours as needed., Disp: , Rfl:  .  predniSONE (DELTASONE) 10 MG tablet, Take 20 mg by mouth 2 (two) times daily with a meal. Lifetime., Disp: , Rfl:  .  promethazine (PHENERGAN) 12.5 MG tablet, Take 12.5 mg by mouth every 8 (eight) hours as needed., Disp: , Rfl:  .  traZODone  (DESYREL) 50 MG tablet, TAKE 0.5-1 TABLETS (25-50 MG TOTAL) BY MOUTH AT BEDTIME AS NEEDED FOR SLEEP., Disp: 90 tablet, Rfl: 1 .  amoxicillin-clavulanate (AUGMENTIN) 875-125 MG tablet, Take 1 tablet by mouth 2 (two) times daily., Disp: , Rfl:   Social History   Tobacco Use  Smoking Status Current Some Day Smoker  . Packs/day: 0.25  . Years: 1.00  . Pack years: 0.25  . Types: Cigarettes  . Start date: 03/17/2017  Smokeless Tobacco Never Used    Allergies  Allergen Reactions  . Benzodiazepines Hives  . Tramadol Nausea And Vomiting  . Morphine And Related Nausea And Vomiting  . Nucynta [Tapentadol] Nausea And Vomiting  . Oxycodone Itching   Objective:  There were no vitals filed for this visit. There is no height or weight on file to calculate BMI. Constitutional Well developed. Well nourished.  Vascular Dorsalis pedis pulses palpable bilaterally. Posterior tibial pulses palpable bilaterally. Capillary refill normal to all digits.  No cyanosis or clubbing noted. Pedal hair growth normal.  Neurologic Normal speech. Oriented to person, place, and time. Epicritic sensation to light touch grossly present bilaterally.  Dermatologic Nails well groomed and normal in appearance. No open wounds. No skin lesions.  Orthopedic: Normal joint ROM without pain or crepitus bilaterally. No visible deformities. Tender to palpation at the  calcaneal tuber bilaterally. No pain with calcaneal squeeze bilaterally. Ankle ROM normal range of motion bilaterally. [place] Test: negative bilaterally.   Radiographs: Taken and reviewed. No acute fractures or dislocations. No evidence of stress fracture.  Plantar heel spur present. Posterior heel spur absent.   Assessment:   1. Foot pain, bilateral    Plan:  Patient was evaluated and treated and all questions answered.  Plantar Fasciitis, bilaterally - XR reviewed as above.  - Educated on icing and stretching. Instructions given.  -  Injection delivered to the plantar fascia as below. - DME: Plantar Fascial Brace x2 - Pharmacologic management: None -I will hold off on injection for now however I have asked the patient that if the pain does not get resolved in 4 weeks when she comes to see me I will plan on giving her an injection. Patient states understanding and will do so. -Also discussed with the patient custom-made orthotics management given that she has pes planus deformity that is semiflexible in nature. I believe patient will benefit from custom-made orthotics to help address the hindfoot motion as well as the support the arch of the foot and take the stress off of the plantar fasciitis. Patient states understanding and will schedule an appointment to see [facility] for orthotics.   Return in about 1 week (around 11/02/2019) for Sched with Liliane Channel for [employer].

## 2019-11-12 ENCOUNTER — Ambulatory Visit: Payer: BLUE CROSS/BLUE SHIELD | Admitting: Internal Medicine

## 2019-11-24 ENCOUNTER — Other Ambulatory Visit: Payer: BLUE CROSS/BLUE SHIELD | Admitting: Orthotics

## 2020-08-28 ENCOUNTER — Other Ambulatory Visit: Payer: Self-pay | Admitting: Internal Medicine

## 2020-08-28 DIAGNOSIS — E278 Other specified disorders of adrenal gland: Secondary | ICD-10-CM

## 2021-04-04 ENCOUNTER — Ambulatory Visit
Admission: RE | Admit: 2021-04-04 | Discharge: 2021-04-04 | Disposition: A | Discharge status: Home / Self Care | Payer: No Typology Code available for payment source | Source: Ambulatory Visit | Attending: Physician Assistant | Admitting: Physician Assistant

## 2021-04-04 ENCOUNTER — Other Ambulatory Visit: Payer: Self-pay | Admitting: Physician Assistant

## 2021-04-04 ENCOUNTER — Ambulatory Visit
Admission: RE | Admit: 2021-04-04 | Discharge: 2021-04-04 | Disposition: A | Discharge status: Home / Self Care | Payer: No Typology Code available for payment source | Attending: Physician Assistant | Admitting: Physician Assistant

## 2021-04-04 DIAGNOSIS — S99911A Unspecified injury of right ankle, initial encounter: Secondary | ICD-10-CM

## 2021-08-03 ENCOUNTER — Other Ambulatory Visit: Payer: Self-pay | Admitting: Internal Medicine

## 2021-08-03 DIAGNOSIS — Z0000 Encounter for general adult medical examination without abnormal findings: Secondary | ICD-10-CM

## 2021-08-30 ENCOUNTER — Other Ambulatory Visit: Payer: Self-pay

## 2021-08-30 ENCOUNTER — Ambulatory Visit
Admission: RE | Admit: 2021-08-30 | Discharge: 2021-08-30 | Disposition: A | Discharge status: Home / Self Care | Payer: 59 | Source: Ambulatory Visit | Attending: Internal Medicine | Admitting: Internal Medicine

## 2021-08-30 DIAGNOSIS — Z0000 Encounter for general adult medical examination without abnormal findings: Secondary | ICD-10-CM | POA: Diagnosis present

## 2021-09-19 LAB — COLOGUARD
COLOGUARD: NEGATIVE
COLOGUARD: NEGATIVE

## 2022-07-01 DIAGNOSIS — E1169 Type 2 diabetes mellitus with other specified complication: Secondary | ICD-10-CM | POA: Diagnosis not present

## 2022-07-01 DIAGNOSIS — E782 Mixed hyperlipidemia: Secondary | ICD-10-CM | POA: Diagnosis not present

## 2022-07-08 DIAGNOSIS — E250 Congenital adrenogenital disorders associated with enzyme deficiency: Secondary | ICD-10-CM | POA: Diagnosis not present

## 2022-07-08 DIAGNOSIS — E1169 Type 2 diabetes mellitus with other specified complication: Secondary | ICD-10-CM | POA: Diagnosis not present

## 2022-08-26 HISTORY — PX: OTHER SURGICAL HISTORY: SHX169

## 2022-09-19 DIAGNOSIS — H902 Conductive hearing loss, unspecified: Secondary | ICD-10-CM | POA: Diagnosis not present

## 2022-09-19 DIAGNOSIS — H900 Conductive hearing loss, bilateral: Secondary | ICD-10-CM | POA: Diagnosis not present

## 2022-09-19 DIAGNOSIS — H6502 Acute serous otitis media, left ear: Secondary | ICD-10-CM | POA: Diagnosis not present

## 2022-09-19 DIAGNOSIS — H6983 Other specified disorders of Eustachian tube, bilateral: Secondary | ICD-10-CM | POA: Diagnosis not present

## 2022-10-24 DIAGNOSIS — E782 Mixed hyperlipidemia: Secondary | ICD-10-CM | POA: Diagnosis not present

## 2022-10-24 DIAGNOSIS — E1169 Type 2 diabetes mellitus with other specified complication: Secondary | ICD-10-CM | POA: Diagnosis not present

## 2022-10-31 DIAGNOSIS — E1169 Type 2 diabetes mellitus with other specified complication: Secondary | ICD-10-CM | POA: Diagnosis not present

## 2022-10-31 DIAGNOSIS — E1165 Type 2 diabetes mellitus with hyperglycemia: Secondary | ICD-10-CM | POA: Diagnosis not present

## 2022-10-31 DIAGNOSIS — E250 Congenital adrenogenital disorders associated with enzyme deficiency: Secondary | ICD-10-CM | POA: Diagnosis not present

## 2022-11-11 DIAGNOSIS — R000 Tachycardia, unspecified: Secondary | ICD-10-CM | POA: Diagnosis not present

## 2022-11-11 DIAGNOSIS — Z1231 Encounter for screening mammogram for malignant neoplasm of breast: Secondary | ICD-10-CM | POA: Diagnosis not present

## 2022-11-11 DIAGNOSIS — G4733 Obstructive sleep apnea (adult) (pediatric): Secondary | ICD-10-CM | POA: Diagnosis not present

## 2022-11-20 DIAGNOSIS — R000 Tachycardia, unspecified: Secondary | ICD-10-CM | POA: Diagnosis not present

## 2022-12-13 DIAGNOSIS — R112 Nausea with vomiting, unspecified: Secondary | ICD-10-CM | POA: Diagnosis not present

## 2022-12-13 DIAGNOSIS — R109 Unspecified abdominal pain: Secondary | ICD-10-CM | POA: Diagnosis not present

## 2022-12-16 DIAGNOSIS — R000 Tachycardia, unspecified: Secondary | ICD-10-CM | POA: Diagnosis not present

## 2022-12-25 DIAGNOSIS — J45909 Unspecified asthma, uncomplicated: Secondary | ICD-10-CM | POA: Diagnosis not present

## 2022-12-25 DIAGNOSIS — Z91040 Latex allergy status: Secondary | ICD-10-CM | POA: Diagnosis not present

## 2022-12-25 DIAGNOSIS — R109 Unspecified abdominal pain: Secondary | ICD-10-CM | POA: Diagnosis not present

## 2022-12-25 DIAGNOSIS — R1013 Epigastric pain: Secondary | ICD-10-CM | POA: Diagnosis not present

## 2022-12-25 DIAGNOSIS — K831 Obstruction of bile duct: Secondary | ICD-10-CM | POA: Diagnosis not present

## 2022-12-25 DIAGNOSIS — I10 Essential (primary) hypertension: Secondary | ICD-10-CM | POA: Diagnosis not present

## 2022-12-25 DIAGNOSIS — Z79899 Other long term (current) drug therapy: Secondary | ICD-10-CM | POA: Diagnosis not present

## 2022-12-25 DIAGNOSIS — J4520 Mild intermittent asthma, uncomplicated: Secondary | ICD-10-CM | POA: Diagnosis not present

## 2022-12-26 DIAGNOSIS — K831 Obstruction of bile duct: Secondary | ICD-10-CM | POA: Diagnosis not present

## 2022-12-26 DIAGNOSIS — R109 Unspecified abdominal pain: Secondary | ICD-10-CM | POA: Diagnosis not present

## 2022-12-31 ENCOUNTER — Other Ambulatory Visit: Payer: Self-pay

## 2022-12-31 DIAGNOSIS — R1900 Intra-abdominal and pelvic swelling, mass and lump, unspecified site: Secondary | ICD-10-CM

## 2022-12-31 DIAGNOSIS — E278 Other specified disorders of adrenal gland: Secondary | ICD-10-CM

## 2023-01-02 DIAGNOSIS — N200 Calculus of kidney: Secondary | ICD-10-CM | POA: Diagnosis not present

## 2023-01-21 ENCOUNTER — Ambulatory Visit
Admission: RE | Admit: 2023-01-21 | Discharge: 2023-01-21 | Disposition: A | Discharge status: Home / Self Care | Payer: BC Managed Care – PPO | Source: Ambulatory Visit | Attending: Family Medicine | Admitting: Family Medicine

## 2023-01-21 DIAGNOSIS — N281 Cyst of kidney, acquired: Secondary | ICD-10-CM | POA: Diagnosis not present

## 2023-01-21 DIAGNOSIS — E278 Other specified disorders of adrenal gland: Secondary | ICD-10-CM | POA: Insufficient documentation

## 2023-01-21 DIAGNOSIS — R1900 Intra-abdominal and pelvic swelling, mass and lump, unspecified site: Secondary | ICD-10-CM | POA: Insufficient documentation

## 2023-01-21 DIAGNOSIS — K760 Fatty (change of) liver, not elsewhere classified: Secondary | ICD-10-CM | POA: Diagnosis not present

## 2023-01-21 MED ORDER — GADOBUTROL 1 MMOL/ML IV SOLN
7.0000 mL | Freq: Once | INTRAVENOUS | Status: AC | PRN
  Administered 2023-01-21: 7 mL via INTRAVENOUS

## 2023-02-11 DIAGNOSIS — H6983 Other specified disorders of Eustachian tube, bilateral: Secondary | ICD-10-CM | POA: Diagnosis not present

## 2023-02-11 DIAGNOSIS — H900 Conductive hearing loss, bilateral: Secondary | ICD-10-CM | POA: Diagnosis not present

## 2023-02-11 DIAGNOSIS — E1165 Type 2 diabetes mellitus with hyperglycemia: Secondary | ICD-10-CM | POA: Diagnosis not present

## 2023-02-18 DIAGNOSIS — K760 Fatty (change of) liver, not elsewhere classified: Secondary | ICD-10-CM | POA: Diagnosis not present

## 2023-02-18 DIAGNOSIS — E1169 Type 2 diabetes mellitus with other specified complication: Secondary | ICD-10-CM | POA: Diagnosis not present

## 2023-02-18 DIAGNOSIS — E278 Other specified disorders of adrenal gland: Secondary | ICD-10-CM | POA: Diagnosis not present

## 2023-02-18 DIAGNOSIS — E250 Congenital adrenogenital disorders associated with enzyme deficiency: Secondary | ICD-10-CM | POA: Diagnosis not present

## 2023-02-28 DIAGNOSIS — E278 Other specified disorders of adrenal gland: Secondary | ICD-10-CM | POA: Diagnosis not present

## 2023-04-08 DIAGNOSIS — E278 Other specified disorders of adrenal gland: Secondary | ICD-10-CM | POA: Diagnosis not present

## 2023-04-08 DIAGNOSIS — D3502 Benign neoplasm of left adrenal gland: Secondary | ICD-10-CM | POA: Diagnosis not present

## 2023-04-08 DIAGNOSIS — D4411 Neoplasm of uncertain behavior of right adrenal gland: Secondary | ICD-10-CM | POA: Diagnosis not present

## 2023-04-08 DIAGNOSIS — E119 Type 2 diabetes mellitus without complications: Secondary | ICD-10-CM | POA: Diagnosis not present

## 2023-04-08 DIAGNOSIS — Z79899 Other long term (current) drug therapy: Secondary | ICD-10-CM | POA: Diagnosis not present

## 2023-04-08 DIAGNOSIS — E279 Disorder of adrenal gland, unspecified: Secondary | ICD-10-CM | POA: Diagnosis not present

## 2023-04-16 ENCOUNTER — Ambulatory Visit: Admit: 2023-04-16 | Payer: BC Managed Care – PPO | Admitting: Otolaryngology

## 2023-04-16 SURGERY — MYRINGOTOMY WITH TUBE PLACEMENT
Anesthesia: General | Laterality: Bilateral

## 2023-05-02 DIAGNOSIS — M5432 Sciatica, left side: Secondary | ICD-10-CM | POA: Diagnosis not present

## 2023-05-15 DIAGNOSIS — Z23 Encounter for immunization: Secondary | ICD-10-CM | POA: Diagnosis not present

## 2023-05-15 DIAGNOSIS — E278 Other specified disorders of adrenal gland: Secondary | ICD-10-CM | POA: Diagnosis not present

## 2023-05-15 DIAGNOSIS — G4733 Obstructive sleep apnea (adult) (pediatric): Secondary | ICD-10-CM | POA: Diagnosis not present

## 2023-05-15 DIAGNOSIS — Z131 Encounter for screening for diabetes mellitus: Secondary | ICD-10-CM | POA: Diagnosis not present

## 2023-05-15 DIAGNOSIS — E250 Congenital adrenogenital disorders associated with enzyme deficiency: Secondary | ICD-10-CM | POA: Diagnosis not present

## 2023-05-15 DIAGNOSIS — Z0000 Encounter for general adult medical examination without abnormal findings: Secondary | ICD-10-CM | POA: Diagnosis not present

## 2023-05-15 DIAGNOSIS — R000 Tachycardia, unspecified: Secondary | ICD-10-CM | POA: Diagnosis not present

## 2023-05-15 DIAGNOSIS — Z13220 Encounter for screening for lipoid disorders: Secondary | ICD-10-CM | POA: Diagnosis not present

## 2023-05-15 DIAGNOSIS — R1011 Right upper quadrant pain: Secondary | ICD-10-CM | POA: Diagnosis not present

## 2023-05-16 ENCOUNTER — Other Ambulatory Visit: Payer: Self-pay | Admitting: Family Medicine

## 2023-05-16 DIAGNOSIS — Z1231 Encounter for screening mammogram for malignant neoplasm of breast: Secondary | ICD-10-CM

## 2023-05-23 ENCOUNTER — Ambulatory Visit
Admission: RE | Admit: 2023-05-23 | Discharge: 2023-05-23 | Disposition: A | Discharge status: Home / Self Care | Payer: BC Managed Care – PPO | Source: Ambulatory Visit | Attending: Family Medicine | Admitting: Family Medicine

## 2023-05-23 DIAGNOSIS — Z1231 Encounter for screening mammogram for malignant neoplasm of breast: Secondary | ICD-10-CM | POA: Insufficient documentation

## 2023-06-04 ENCOUNTER — Other Ambulatory Visit: Payer: Self-pay | Admitting: Otolaryngology

## 2023-06-12 DIAGNOSIS — H6983 Other specified disorders of Eustachian tube, bilateral: Secondary | ICD-10-CM | POA: Diagnosis not present

## 2023-06-16 ENCOUNTER — Encounter: Payer: Self-pay | Admitting: Otolaryngology

## 2023-06-16 DIAGNOSIS — E782 Mixed hyperlipidemia: Secondary | ICD-10-CM | POA: Diagnosis not present

## 2023-06-16 DIAGNOSIS — E1169 Type 2 diabetes mellitus with other specified complication: Secondary | ICD-10-CM | POA: Diagnosis not present

## 2023-06-16 DIAGNOSIS — K760 Fatty (change of) liver, not elsewhere classified: Secondary | ICD-10-CM | POA: Diagnosis not present

## 2023-06-19 ENCOUNTER — Other Ambulatory Visit: Payer: Self-pay

## 2023-06-19 MED ORDER — CIPROFLOXACIN-DEXAMETHASONE 0.3-0.1 % OT SUSP
4.0000 [drp] | Freq: Two times a day (BID) | OTIC | 0 refills | Status: DC
  Filled 2023-06-19: qty 7.5, 19d supply, fill #0

## 2023-06-23 DIAGNOSIS — E1169 Type 2 diabetes mellitus with other specified complication: Secondary | ICD-10-CM | POA: Diagnosis not present

## 2023-06-23 DIAGNOSIS — E250 Congenital adrenogenital disorders associated with enzyme deficiency: Secondary | ICD-10-CM | POA: Diagnosis not present

## 2023-06-23 DIAGNOSIS — E1165 Type 2 diabetes mellitus with hyperglycemia: Secondary | ICD-10-CM | POA: Diagnosis not present

## 2023-06-23 DIAGNOSIS — E278 Other specified disorders of adrenal gland: Secondary | ICD-10-CM | POA: Diagnosis not present

## 2023-06-23 NOTE — Discharge Instructions (Signed)
[facility] DISCHARGE INSTRUCTIONS FOR MYRINGOTOMY AND TUBE INSERTION  [facility] CREIGHTON VAUGHT, M.D.   Diet:   After surgery, the patient should take only liquids and foods as tolerated.  The patient may then have a regular diet after the effects of anesthesia have worn off, usually about four to six hours after surgery.  Activities:   The patient should rest until the effects of anesthesia have worn off.  After this, there are no restrictions on the normal daily activities.  Medications:   You will be given a prescription for antibiotic drops to be used in the ears postoperatively.  It is recommended to use 4 drops 2 times a day for 4 days, then the drops should be saved for possible future use.  The tubes should not cause any discomfort to the patient, but if there is any question, Tylenol should be given according to the instructions for the age of the patient.  Other medications should be continued normally.  Precautions:   Should there be recurrent drainage after the tubes are placed, the drops should be used for approximately 3-4 days.  If it does not clear, you should call the ENT office.  Earplugs:   Earplugs are only needed for those who are going to be submerged under water.  When taking a bath or shower and using a cup or showerhead to rinse hair, it is not necessary to wear earplugs.  These come in a variety of fashions, all of which can be obtained at our office.  However, if one is not able to come by the office, then silicone plugs can be found at most pharmacies.  It is not advised to stick anything in the ear that is not approved as an earplug.  Silly putty is not to be used as an earplug.  Swimming is allowed in patients after ear tubes are inserted, however, they must wear earplugs if they are going to be submerged under water.  For those children who are going to be swimming a lot, it is recommended to use a fitted ear mold, which can be  made by our audiologist.  If discharge is noticed from the ears, this most likely represents an ear infection.  We would recommend getting your eardrops and using them as indicated above.  If it does not clear, then you should call the ENT office.  For follow up, the patient should return to the ENT office three weeks postoperatively and then every six months as required by the doctor. 

## 2023-06-25 ENCOUNTER — Other Ambulatory Visit: Payer: Self-pay

## 2023-06-25 ENCOUNTER — Ambulatory Visit: Payer: BC Managed Care – PPO | Admitting: General Practice

## 2023-06-25 ENCOUNTER — Encounter
Admission: RE | Disposition: A | Discharge status: Home / Self Care | Payer: Self-pay | Source: Home / Self Care | Attending: Otolaryngology

## 2023-06-25 ENCOUNTER — Encounter: Payer: Self-pay | Admitting: Otolaryngology

## 2023-06-25 ENCOUNTER — Ambulatory Visit
Admission: RE | Admit: 2023-06-25 | Discharge: 2023-06-25 | Disposition: A | Discharge status: Home / Self Care | Payer: BC Managed Care – PPO | Attending: Otolaryngology | Admitting: Otolaryngology

## 2023-06-25 DIAGNOSIS — H902 Conductive hearing loss, unspecified: Secondary | ICD-10-CM | POA: Diagnosis not present

## 2023-06-25 DIAGNOSIS — E1165 Type 2 diabetes mellitus with hyperglycemia: Secondary | ICD-10-CM | POA: Diagnosis not present

## 2023-06-25 DIAGNOSIS — H6983 Other specified disorders of Eustachian tube, bilateral: Secondary | ICD-10-CM | POA: Diagnosis not present

## 2023-06-25 DIAGNOSIS — K219 Gastro-esophageal reflux disease without esophagitis: Secondary | ICD-10-CM | POA: Insufficient documentation

## 2023-06-25 DIAGNOSIS — H900 Conductive hearing loss, bilateral: Secondary | ICD-10-CM | POA: Diagnosis not present

## 2023-06-25 DIAGNOSIS — G4730 Sleep apnea, unspecified: Secondary | ICD-10-CM | POA: Diagnosis not present

## 2023-06-25 DIAGNOSIS — H6993 Unspecified Eustachian tube disorder, bilateral: Secondary | ICD-10-CM | POA: Diagnosis not present

## 2023-06-25 HISTORY — DX: Sleep apnea, unspecified: G47.30

## 2023-06-25 HISTORY — DX: Other specified postprocedural states: Z98.890

## 2023-06-25 HISTORY — DX: Cardiac arrhythmia, unspecified: I49.9

## 2023-06-25 HISTORY — DX: Personal history of (corrected) cleft lip and palate: Z87.730

## 2023-06-25 HISTORY — DX: Gastro-esophageal reflux disease without esophagitis: K21.9

## 2023-06-25 HISTORY — DX: Nausea with vomiting, unspecified: R11.2

## 2023-06-25 HISTORY — DX: Type 2 diabetes mellitus without complications: E11.9

## 2023-06-25 HISTORY — PX: MYRINGOTOMY WITH TUBE PLACEMENT: SHX5663

## 2023-06-25 LAB — GLUCOSE, CAPILLARY: Glucose-Capillary: 119 mg/dL — ABNORMAL HIGH (ref 70–99)

## 2023-06-25 SURGERY — MYRINGOTOMY WITH TUBE PLACEMENT
Anesthesia: General | Laterality: Bilateral

## 2023-06-25 MED ORDER — LIDOCAINE HCL (CARDIAC) PF 100 MG/5ML IV SOSY
PREFILLED_SYRINGE | INTRAVENOUS | Status: DC | PRN
  Administered 2023-06-25: 50 mg via INTRAVENOUS

## 2023-06-25 MED ORDER — ONDANSETRON HCL 4 MG/2ML IJ SOLN
INTRAMUSCULAR | Status: DC | PRN
  Administered 2023-06-25: 4 mg via INTRAVENOUS

## 2023-06-25 MED ORDER — FENTANYL CITRATE (PF) 100 MCG/2ML IJ SOLN
INTRAMUSCULAR | Status: AC
  Filled 2023-06-25: qty 2

## 2023-06-25 MED ORDER — CIPROFLOXACIN-DEXAMETHASONE 0.3-0.1 % OT SUSP
OTIC | Status: DC | PRN
  Administered 2023-06-25: 4 [drp] via OTIC

## 2023-06-25 MED ORDER — PROPOFOL 10 MG/ML IV BOLUS
INTRAVENOUS | Status: AC
  Filled 2023-06-25: qty 20

## 2023-06-25 MED ORDER — SUCCINYLCHOLINE CHLORIDE 200 MG/10ML IV SOSY
PREFILLED_SYRINGE | INTRAVENOUS | Status: DC | PRN
  Administered 2023-06-25: 100 mg via INTRAVENOUS

## 2023-06-25 MED ORDER — PROPOFOL 10 MG/ML IV BOLUS
INTRAVENOUS | Status: DC | PRN
  Administered 2023-06-25: 200 mg via INTRAVENOUS

## 2023-06-25 MED ORDER — ONDANSETRON HCL 4 MG/2ML IJ SOLN
INTRAMUSCULAR | Status: AC
  Filled 2023-06-25: qty 2

## 2023-06-25 MED ORDER — SUCCINYLCHOLINE CHLORIDE 200 MG/10ML IV SOSY
PREFILLED_SYRINGE | INTRAVENOUS | Status: AC
Start: 2023-06-25 — End: ?
  Filled 2023-06-25: qty 10

## 2023-06-25 MED ORDER — ONDANSETRON HCL 4 MG PO TABS: 4.0000 mg | ORAL_TABLET | Freq: Three times a day (TID) | ORAL | 0 refills | Status: DC | PRN

## 2023-06-25 MED ORDER — FENTANYL CITRATE (PF) 100 MCG/2ML IJ SOLN
INTRAMUSCULAR | Status: DC | PRN
  Administered 2023-06-25: 100 ug via INTRAVENOUS

## 2023-06-25 MED ORDER — MIDAZOLAM HCL 5 MG/5ML IJ SOLN
INTRAMUSCULAR | Status: DC | PRN
  Administered 2023-06-25: 2 mg via INTRAVENOUS

## 2023-06-25 MED ORDER — LIDOCAINE HCL (PF) 2 % IJ SOLN
INTRAMUSCULAR | Status: AC
  Filled 2023-06-25: qty 5

## 2023-06-25 MED ORDER — MIDAZOLAM HCL 2 MG/2ML IJ SOLN
INTRAMUSCULAR | Status: AC
  Filled 2023-06-25: qty 2

## 2023-06-25 MED ORDER — CIPROFLOXACIN-DEXAMETHASONE 0.3-0.1 % OT SUSP
4.0000 [drp] | Freq: Two times a day (BID) | OTIC | Status: DC
Start: 2023-07-09 — End: 2023-06-25

## 2023-06-25 SURGICAL SUPPLY — 9 items
BALL CTTN LRG ABS STRL LF (GAUZE/BANDAGES/DRESSINGS) ×1
BLADE MYR LANCE NRW W/HDL (BLADE) ×1 IMPLANT
CANISTER SUCT 1200ML W/VALVE (MISCELLANEOUS) ×1 IMPLANT
COTTONBALL LRG STERILE PKG (GAUZE/BANDAGES/DRESSINGS) ×1 IMPLANT
GLOVE SURG GAMMEX PI TX LF 7.5 (GLOVE) ×1 IMPLANT
STRAP BODY AND KNEE 60X3 (MISCELLANEOUS) ×1 IMPLANT
TOWEL OR 17X26 4PK STRL BLUE (TOWEL DISPOSABLE) ×1 IMPLANT
TUBE EAR T 1.27X5.3 BFLY (OTOLOGIC RELATED) IMPLANT
TUBING SUCTION CONN 0.25 STRL (TUBING) ×1 IMPLANT

## 2023-06-25 NOTE — Anesthesia Procedure Notes (Signed)
Procedure Name: Intubation Date/Time: 06/25/2023 8:50 AM  Performed by: Domenic Moras, CRNAPre-anesthesia Checklist: Patient identified, Emergency Drugs available, Suction available and Patient being monitored Patient Re-evaluated:Patient Re-evaluated prior to induction Oxygen Delivery Method: Circle system utilized Preoxygenation: Pre-oxygenation with 100% oxygen Induction Type: IV induction and Cricoid Pressure applied Laryngoscope Size: 3 and McGraph Tube type: Oral Tube size: 7.0 mm Number of attempts: 1 Airway Equipment and Method: Stylet and Oral airway Placement Confirmation: ETT inserted through vocal cords under direct vision, positive ETCO2 and breath sounds checked- equal and bilateral Secured at: 20 cm Tube secured with: Tape Dental Injury: Teeth and Oropharynx as per pre-operative assessment

## 2023-06-25 NOTE — Transfer of Care (Signed)
Immediate Anesthesia Transfer of Care Note  Patient: Tina Carrillo  Procedure(s) Performed: MYRINGOTOMY WITH TUBE PLACEMENT (BUTTERFLY TUBES) (Bilateral)  Patient Location: PACU  Anesthesia Type: General  Level of Consciousness: awake, alert  and patient cooperative  Airway and Oxygen Therapy: Patient Spontanous Breathing and Patient connected to supplemental oxygen  Post-op Assessment: Post-op Vital signs reviewed, Patient's Cardiovascular Status Stable, Respiratory Function Stable, Patent Airway and No signs of Nausea or vomiting  Post-op Vital Signs: Reviewed and stable  Complications: No notable events documented.

## 2023-06-25 NOTE — Anesthesia Preprocedure Evaluation (Addendum)
Anesthesia Evaluation  Patient identified by MRN, date of birth, ID band Patient awake    Reviewed: Allergy & Precautions, H&P , NPO status , Patient's Chart, lab work & pertinent test results  History of Anesthesia Complications (+) PONV and history of anesthetic complications  Airway Mallampati: III  TM Distance: <3 FB Neck ROM: Full    Dental no notable dental hx.    Pulmonary neg pulmonary ROS, sleep apnea , Current Smoker   Pulmonary exam normal breath sounds clear to auscultation       Cardiovascular negative cardio ROS Normal cardiovascular exam+ dysrhythmias  Rhythm:Regular Rate:Normal     Neuro/Psych  PSYCHIATRIC DISORDERS      negative neurological ROS  negative psych ROS   GI/Hepatic Neg liver ROS,GERD  ,,  Endo/Other  diabetes    Renal/GU negative Renal ROS  negative genitourinary   Musculoskeletal negative musculoskeletal ROS (+)    Abdominal   Peds negative pediatric ROS (+)  Hematology negative hematology ROS (+)   Anesthesia Other Findings Poorly controlled DM Congenital drenal hyperplasia  Adrenal benign tumor PONV (postoperative nausea and vomiting)  GERD Hx repair congenital cleft palate Sleep apnea, can't afford CPAP, discussed possible alternative methods of controlling sleep apnea and risks untreated sleep apnea  Reproductive/Obstetrics negative OB ROS                              Anesthesia Physical Anesthesia Plan  ASA: 3  Anesthesia Plan: General   Post-op Pain Management:    Induction: Intravenous  PONV Risk Score and Plan:   Airway Management Planned: Natural Airway and Nasal Cannula  Additional Equipment:   Intra-op Plan:   Post-operative Plan:   Informed Consent: I have reviewed the patients History and Physical, chart, labs and discussed the procedure including the risks, benefits and alternatives for the proposed anesthesia with the  patient or authorized representative who has indicated his/her understanding and acceptance.     Dental Advisory Given  Plan Discussed with: Anesthesiologist, CRNA and Surgeon  Anesthesia Plan Comments: (Patient consented for risks of anesthesia including but not limited to:  - adverse reactions to medications - risk of airway placement if required - damage to eyes, teeth, lips or other oral mucosa - nerve damage due to positioning  - sore throat or hoarseness - Damage to heart, brain, nerves, lungs, other parts of body or loss of life  Patient voiced understanding and assent.)         Anesthesia Quick Evaluation

## 2023-06-25 NOTE — Op Note (Signed)
..  06/25/2023  9:08 AM    Thereasa Distance  [id number]   Pre-Op Dx:  Eustachian tube dysfunction, Conductive hearing loss  Post-op Dx: Eustachian tube dysfunction, Conductive hearing loss  Proc:Bilateral myringotomy with BUTTERFLY tubes  Surg: Billy Rocco  Anes:  General by mask  EBL:  None  Comp:  None  Findings:  Retraction and fluid bilaterally.  Myringosclerosis bilaterally.  Butterfly tubes placed anterior inferiorly.  Procedure: With the patient in a comfortable supine position, general mask anesthesia was administered.  At an appropriate level, microscope and speculum were used to examine and clean the RIGHT ear canal.  The findings were as described above.  An anterior inferior radial myringotomy incision was sharply executed.  Middle ear contents were suctioned clear with a size 5 otologic suction.  A BUTTERFLY tube was placed without difficulty using a Rosen pick and Facilities manager.  Ciprodex otic solution was instilled into the external canal, and insufflated into the middle ear.  A cotton ball was placed at the external meatus. Hemostasis was observed.  This side was completed.  After completing the RIGHT side, the LEFT side was done in identical fashion.    Following this  The patient was returned to anesthesia, awakened, and transferred to recovery in stable condition.  Dispo:  PACU to home  Plan: Routine drop use and water precautions.  Recheck my office three weeks.   Tisa Weisel 9:08 AM 06/25/2023

## 2023-06-25 NOTE — H&P (Signed)
..  History and Physical paper copy reviewed and updated date of procedure and will be scanned into system.  Patient seen and examined.  Will proceed with BUTTERFLY tube placement as discussed.

## 2023-06-25 NOTE — Anesthesia Postprocedure Evaluation (Signed)
Anesthesia Post Note  Patient: SHTERNA FULTZ  Procedure(s) Performed: MYRINGOTOMY WITH TUBE PLACEMENT (BUTTERFLY TUBES) (Bilateral)  Patient location during evaluation: PACU Anesthesia Type: General Level of consciousness: awake and alert Pain management: pain level controlled Vital Signs Assessment: post-procedure vital signs reviewed and stable Respiratory status: spontaneous breathing, nonlabored ventilation, respiratory function stable and patient connected to nasal cannula oxygen Cardiovascular status: blood pressure returned to baseline and stable Postop Assessment: no apparent nausea or vomiting Anesthetic complications: no   No notable events documented.   Last Vitals:  Vitals:   06/25/23 0928 06/25/23 0930  BP: 129/83   Pulse: (!) 104 (!) 110  Resp: (!) 22 17  Temp: (!) 36.2 C   SpO2: 95% 94%    Last Pain:  Vitals:   06/25/23 0928  TempSrc:   PainSc: 0-No pain                 Golden Gilreath C Samiya Mervin

## 2023-06-26 ENCOUNTER — Encounter: Payer: Self-pay | Admitting: Otolaryngology

## 2023-07-15 DIAGNOSIS — H6983 Other specified disorders of Eustachian tube, bilateral: Secondary | ICD-10-CM | POA: Diagnosis not present

## 2023-07-21 DIAGNOSIS — Z885 Allergy status to narcotic agent status: Secondary | ICD-10-CM | POA: Diagnosis not present

## 2023-07-21 DIAGNOSIS — E278 Other specified disorders of adrenal gland: Secondary | ICD-10-CM | POA: Diagnosis not present

## 2023-07-21 DIAGNOSIS — E119 Type 2 diabetes mellitus without complications: Secondary | ICD-10-CM | POA: Diagnosis not present

## 2023-07-21 DIAGNOSIS — D1779 Benign lipomatous neoplasm of other sites: Secondary | ICD-10-CM | POA: Diagnosis not present

## 2023-07-21 DIAGNOSIS — E279 Disorder of adrenal gland, unspecified: Secondary | ICD-10-CM | POA: Diagnosis not present

## 2023-07-21 DIAGNOSIS — Z7984 Long term (current) use of oral hypoglycemic drugs: Secondary | ICD-10-CM | POA: Diagnosis not present

## 2023-07-21 DIAGNOSIS — E896 Postprocedural adrenocortical (-medullary) hypofunction: Secondary | ICD-10-CM | POA: Diagnosis not present

## 2023-07-21 DIAGNOSIS — E250 Congenital adrenogenital disorders associated with enzyme deficiency: Secondary | ICD-10-CM | POA: Diagnosis not present

## 2023-07-21 DIAGNOSIS — E1165 Type 2 diabetes mellitus with hyperglycemia: Secondary | ICD-10-CM | POA: Diagnosis not present

## 2023-07-21 DIAGNOSIS — Z9089 Acquired absence of other organs: Secondary | ICD-10-CM | POA: Diagnosis not present

## 2023-07-21 DIAGNOSIS — Q359 Cleft palate, unspecified: Secondary | ICD-10-CM | POA: Diagnosis not present

## 2023-07-22 DIAGNOSIS — Z885 Allergy status to narcotic agent status: Secondary | ICD-10-CM | POA: Diagnosis not present

## 2023-07-22 DIAGNOSIS — E1165 Type 2 diabetes mellitus with hyperglycemia: Secondary | ICD-10-CM | POA: Diagnosis not present

## 2023-07-22 DIAGNOSIS — D1779 Benign lipomatous neoplasm of other sites: Secondary | ICD-10-CM | POA: Diagnosis not present

## 2023-07-22 DIAGNOSIS — Q891 Congenital malformations of adrenal gland: Secondary | ICD-10-CM | POA: Diagnosis not present

## 2023-07-22 DIAGNOSIS — Q359 Cleft palate, unspecified: Secondary | ICD-10-CM | POA: Diagnosis not present

## 2023-07-22 DIAGNOSIS — Z9089 Acquired absence of other organs: Secondary | ICD-10-CM | POA: Diagnosis not present

## 2023-07-22 DIAGNOSIS — E279 Disorder of adrenal gland, unspecified: Secondary | ICD-10-CM | POA: Diagnosis not present

## 2023-07-22 DIAGNOSIS — E278 Other specified disorders of adrenal gland: Secondary | ICD-10-CM | POA: Diagnosis not present

## 2023-07-22 DIAGNOSIS — E250 Congenital adrenogenital disorders associated with enzyme deficiency: Secondary | ICD-10-CM | POA: Diagnosis not present

## 2023-07-22 DIAGNOSIS — Z7984 Long term (current) use of oral hypoglycemic drugs: Secondary | ICD-10-CM | POA: Diagnosis not present

## 2023-08-04 DIAGNOSIS — R197 Diarrhea, unspecified: Secondary | ICD-10-CM | POA: Diagnosis not present

## 2023-08-04 DIAGNOSIS — L906 Striae atrophicae: Secondary | ICD-10-CM | POA: Diagnosis not present

## 2023-08-04 DIAGNOSIS — D1779 Benign lipomatous neoplasm of other sites: Secondary | ICD-10-CM | POA: Diagnosis not present

## 2023-08-04 DIAGNOSIS — R140 Abdominal distension (gaseous): Secondary | ICD-10-CM | POA: Diagnosis not present

## 2023-08-04 DIAGNOSIS — Z9089 Acquired absence of other organs: Secondary | ICD-10-CM | POA: Diagnosis not present

## 2023-09-01 ENCOUNTER — Other Ambulatory Visit
Admission: RE | Admit: 2023-09-01 | Discharge: 2023-09-01 | Disposition: A | Discharge status: Home / Self Care | Payer: BC Managed Care – PPO | Source: Ambulatory Visit | Attending: Family Medicine | Admitting: Family Medicine

## 2023-09-01 ENCOUNTER — Other Ambulatory Visit: Payer: Self-pay | Admitting: Family Medicine

## 2023-09-01 DIAGNOSIS — R197 Diarrhea, unspecified: Secondary | ICD-10-CM | POA: Diagnosis not present

## 2023-09-01 DIAGNOSIS — R1033 Periumbilical pain: Secondary | ICD-10-CM | POA: Diagnosis not present

## 2023-09-01 DIAGNOSIS — R1084 Generalized abdominal pain: Secondary | ICD-10-CM

## 2023-09-01 DIAGNOSIS — E1169 Type 2 diabetes mellitus with other specified complication: Secondary | ICD-10-CM | POA: Diagnosis not present

## 2023-09-01 DIAGNOSIS — K420 Umbilical hernia with obstruction, without gangrene: Secondary | ICD-10-CM

## 2023-09-01 DIAGNOSIS — F411 Generalized anxiety disorder: Secondary | ICD-10-CM | POA: Diagnosis not present

## 2023-09-01 LAB — D-DIMER, QUANTITATIVE: D-Dimer, Quant: 0.74 ug{FEU}/mL — ABNORMAL HIGH (ref 0.00–0.50)

## 2023-09-02 ENCOUNTER — Ambulatory Visit
Admission: RE | Admit: 2023-09-02 | Discharge: 2023-09-02 | Disposition: A | Discharge status: Home / Self Care | Payer: BC Managed Care – PPO | Source: Ambulatory Visit | Attending: Family Medicine | Admitting: Family Medicine

## 2023-09-02 DIAGNOSIS — K420 Umbilical hernia with obstruction, without gangrene: Secondary | ICD-10-CM | POA: Diagnosis not present

## 2023-09-02 DIAGNOSIS — R197 Diarrhea, unspecified: Secondary | ICD-10-CM | POA: Insufficient documentation

## 2023-09-02 DIAGNOSIS — R1084 Generalized abdominal pain: Secondary | ICD-10-CM | POA: Diagnosis not present

## 2023-09-02 DIAGNOSIS — Z9049 Acquired absence of other specified parts of digestive tract: Secondary | ICD-10-CM | POA: Diagnosis not present

## 2023-09-03 ENCOUNTER — Other Ambulatory Visit: Payer: Self-pay | Admitting: Family Medicine

## 2023-09-03 ENCOUNTER — Ambulatory Visit
Admission: RE | Admit: 2023-09-03 | Discharge: 2023-09-03 | Disposition: A | Discharge status: Home / Self Care | Payer: BC Managed Care – PPO | Source: Ambulatory Visit | Attending: Family Medicine | Admitting: Family Medicine

## 2023-09-03 DIAGNOSIS — R7989 Other specified abnormal findings of blood chemistry: Secondary | ICD-10-CM | POA: Diagnosis not present

## 2023-09-03 DIAGNOSIS — K760 Fatty (change of) liver, not elsewhere classified: Secondary | ICD-10-CM | POA: Diagnosis not present

## 2023-09-03 DIAGNOSIS — R1033 Periumbilical pain: Secondary | ICD-10-CM | POA: Insufficient documentation

## 2023-09-03 DIAGNOSIS — D3502 Benign neoplasm of left adrenal gland: Secondary | ICD-10-CM | POA: Diagnosis not present

## 2023-09-03 DIAGNOSIS — R109 Unspecified abdominal pain: Secondary | ICD-10-CM | POA: Diagnosis not present

## 2023-09-03 DIAGNOSIS — K5730 Diverticulosis of large intestine without perforation or abscess without bleeding: Secondary | ICD-10-CM | POA: Diagnosis not present

## 2023-09-03 MED ORDER — IOHEXOL 300 MG/ML  SOLN
100.0000 mL | Freq: Once | INTRAMUSCULAR | Status: AC | PRN
  Administered 2023-09-03: 100 mL via INTRAVENOUS

## 2023-09-30 DIAGNOSIS — E250 Congenital adrenogenital disorders associated with enzyme deficiency: Secondary | ICD-10-CM | POA: Diagnosis not present

## 2023-09-30 DIAGNOSIS — E785 Hyperlipidemia, unspecified: Secondary | ICD-10-CM | POA: Diagnosis not present

## 2023-09-30 DIAGNOSIS — E119 Type 2 diabetes mellitus without complications: Secondary | ICD-10-CM | POA: Diagnosis not present

## 2023-10-20 DIAGNOSIS — E1165 Type 2 diabetes mellitus with hyperglycemia: Secondary | ICD-10-CM | POA: Diagnosis not present

## 2023-10-27 DIAGNOSIS — E1169 Type 2 diabetes mellitus with other specified complication: Secondary | ICD-10-CM | POA: Diagnosis not present

## 2023-10-27 DIAGNOSIS — D1779 Benign lipomatous neoplasm of other sites: Secondary | ICD-10-CM | POA: Diagnosis not present

## 2023-10-27 DIAGNOSIS — E250 Congenital adrenogenital disorders associated with enzyme deficiency: Secondary | ICD-10-CM | POA: Diagnosis not present

## 2023-11-13 DIAGNOSIS — Z79899 Other long term (current) drug therapy: Secondary | ICD-10-CM | POA: Diagnosis not present

## 2023-11-13 DIAGNOSIS — Z9889 Other specified postprocedural states: Secondary | ICD-10-CM | POA: Diagnosis not present

## 2023-11-13 DIAGNOSIS — Z7901 Long term (current) use of anticoagulants: Secondary | ICD-10-CM | POA: Diagnosis not present

## 2023-11-13 DIAGNOSIS — K219 Gastro-esophageal reflux disease without esophagitis: Secondary | ICD-10-CM | POA: Diagnosis not present

## 2023-11-13 DIAGNOSIS — G4730 Sleep apnea, unspecified: Secondary | ICD-10-CM | POA: Diagnosis not present

## 2023-11-13 DIAGNOSIS — J45909 Unspecified asthma, uncomplicated: Secondary | ICD-10-CM | POA: Diagnosis not present

## 2023-11-13 DIAGNOSIS — K529 Noninfective gastroenteritis and colitis, unspecified: Secondary | ICD-10-CM | POA: Diagnosis not present

## 2023-11-13 DIAGNOSIS — I10 Essential (primary) hypertension: Secondary | ICD-10-CM | POA: Diagnosis not present

## 2023-11-13 DIAGNOSIS — Z9884 Bariatric surgery status: Secondary | ICD-10-CM | POA: Diagnosis not present

## 2023-12-11 ENCOUNTER — Telehealth: Admitting: Physician Assistant

## 2023-12-11 DIAGNOSIS — J019 Acute sinusitis, unspecified: Secondary | ICD-10-CM

## 2023-12-11 DIAGNOSIS — B9689 Other specified bacterial agents as the cause of diseases classified elsewhere: Secondary | ICD-10-CM

## 2023-12-11 MED ORDER — FLUTICASONE PROPIONATE 50 MCG/ACT NA SUSP: 2.0000 | Freq: Every day | NASAL | 0 refills | Status: DC

## 2023-12-11 MED ORDER — DOXYCYCLINE HYCLATE 100 MG PO TABS: 100.0000 mg | ORAL_TABLET | Freq: Two times a day (BID) | ORAL | 0 refills | Status: DC

## 2023-12-11 NOTE — Patient Instructions (Signed)
 Tina Carrillo, thank you for joining Hyla Maillard, PA-C for today's virtual visit.  While this provider is not your primary care provider (PCP), if your PCP is located in our provider database this encounter information will be shared with them immediately following your visit.   A [facility] MyChart account gives you access to today's visit and all your visits, tests, and labs performed at [facility] " click here if you don't have a [facility] MyChart account or go to [email or web address]  Consent: (Patient) Tina Carrillo provided verbal consent for this virtual visit at the beginning of the encounter.  Current Medications:  Current Outpatient Medications:    ALPRAZolam (XANAX) 0.25 MG tablet, Take 0.25 mg by mouth at bedtime as needed for anxiety., Disp: , Rfl:    Blood Glucose Monitoring Suppl (FIFTY50 GLUCOSE METER 2.0) w/Device KIT, Use as directed, Disp: , Rfl:    ciprofloxacin-dexamethasone (CIPRODEX) OTIC suspension, Place 4 drops into both ears 2 (two) times daily for 5 days. (DOS 06/25/23), Disp: 7.5 mL, Rfl: 0   fludrocortisone (FLORINEF) 0.1 MG tablet, Take 0.1 mg by mouth daily. Lifetime., Disp: , Rfl:    fluticasone (FLONASE) 50 MCG/ACT nasal spray, Place into the nose., Disp: , Rfl:    glucose blood (PRECISION QID TEST) test strip, Use once daily Use as instructed., Disp: , Rfl:    ibuprofen (ADVIL,MOTRIN) 200 MG tablet, Take 200 mg by mouth every 6 (six) hours as needed., Disp: , Rfl:    ondansetron (ZOFRAN) 4 MG tablet, Take 1 tablet (4 mg total) by mouth every 8 (eight) hours as needed for nausea or vomiting., Disp: 20 tablet, Rfl: 0   predniSONE (DELTASONE) 10 MG tablet, Take 20 mg by mouth 2 (two) times daily with a meal. Lifetime., Disp: , Rfl:    promethazine (PHENERGAN) 12.5 MG tablet, Take 12.5 mg by mouth every 8 (eight) hours as needed., Disp: , Rfl:    Medications ordered in this encounter:  No orders of the defined types were placed in  this encounter.    *If you need refills on other medications prior to your next appointment, please contact your pharmacy*  Follow-Up: Call back or seek an in-person evaluation if the symptoms worsen or if the condition fails to improve as anticipated.  [facility] [phone]  Other Instructions Please take antibiotic as directed.  Increase fluid intake.  Use Saline nasal spray.  Take a daily multivitamin. Continue regular medications. Start OTC antihistamine -- Claritin or Xyzal -- once daily.  Place a humidifier in the bedroom.  Please call or return clinic if symptoms are not improving.  Sinusitis Sinusitis is redness, soreness, and swelling (inflammation) of the paranasal sinuses. Paranasal sinuses are air pockets within the bones of your face (beneath the eyes, the middle of the forehead, or above the eyes). In healthy paranasal sinuses, mucus is able to drain out, and air is able to circulate through them by way of your nose. However, when your paranasal sinuses are inflamed, mucus and air can become trapped. This can allow bacteria and other germs to grow and cause infection. Sinusitis can develop quickly and last only a short time (acute) or continue over a long period (chronic). Sinusitis that lasts for more than 12 weeks is considered chronic.  CAUSES  Causes of sinusitis include: Allergies. Structural abnormalities, such as displacement of the cartilage that separates your nostrils (deviated septum), which can decrease the air flow through your nose and sinuses and  affect sinus drainage. Functional abnormalities, such as when the small hairs (cilia) that line your sinuses and help remove mucus do not work properly or are not present. SYMPTOMS  Symptoms of acute and chronic sinusitis are the same. The primary symptoms are pain and pressure around the affected sinuses. Other symptoms include: Upper toothache. Earache. Headache. Bad breath. Decreased sense of smell  and taste. A cough, which worsens when you are lying flat. Fatigue. Fever. Thick drainage from your nose, which often is green and may contain pus (purulent). Swelling and warmth over the affected sinuses. DIAGNOSIS  Your caregiver will perform a physical exam. During the exam, your caregiver may: Look in your nose for signs of abnormal growths in your nostrils (nasal polyps). Tap over the affected sinus to check for signs of infection. View the inside of your sinuses (endoscopy) with a special imaging device with a light attached (endoscope), which is inserted into your sinuses. If your caregiver suspects that you have chronic sinusitis, one or more of the following tests may be recommended: Allergy tests. Nasal culture A sample of mucus is taken from your nose and sent to a lab and screened for bacteria. Nasal cytology A sample of mucus is taken from your nose and examined by your caregiver to determine if your sinusitis is related to an allergy. TREATMENT  Most cases of acute sinusitis are related to a viral infection and will resolve on their own within 10 days. Sometimes medicines are prescribed to help relieve symptoms (pain medicine, decongestants, nasal steroid sprays, or saline sprays).  However, for sinusitis related to a bacterial infection, your caregiver will prescribe antibiotic medicines. These are medicines that will help kill the bacteria causing the infection.  Rarely, sinusitis is caused by a fungal infection. In theses cases, your caregiver will prescribe antifungal medicine. For some cases of chronic sinusitis, surgery is needed. Generally, these are cases in which sinusitis recurs more than 3 times per year, despite other treatments. HOME CARE INSTRUCTIONS  Drink plenty of water. Water helps thin the mucus so your sinuses can drain more easily. Use a humidifier. Inhale steam 3 to 4 times a day (for example, sit in the bathroom with the shower running). Apply a warm,  moist washcloth to your face 3 to 4 times a day, or as directed by your caregiver. Use saline nasal sprays to help moisten and clean your sinuses. Take over-the-counter or prescription medicines for pain, discomfort, or fever only as directed by your caregiver. SEEK IMMEDIATE MEDICAL CARE IF: You have increasing pain or severe headaches. You have nausea, vomiting, or drowsiness. You have swelling around your face. You have vision problems. You have a stiff neck. You have difficulty breathing. MAKE SURE YOU:  Understand these instructions. Will watch your condition. Will get help right away if you are not doing well or get worse. Document Released: 08/12/2005 Document Revised: 11/04/2011 Document Reviewed: 08/27/2011 [facility] Patient Information 2014 [place], [place].    If you have been instructed to have an in-person evaluation today at a local Urgent Care facility, please use the link below. It will take you to a list of all of our available [facility], including address, phone number and hours of operation. Please do not delay care.  [facility]  If you or a family member do not have a primary care provider, use the link below to schedule a visit and establish care. When you choose a [facility] primary care physician or advanced practice  provider, you gain a long-term partner in health. Find a Primary Care Provider  Learn more about [facility]'s in-office and virtual care options: [facility] - Get Care Now

## 2023-12-11 NOTE — Progress Notes (Signed)
 Virtual Visit Consent   Tina Carrillo, you are scheduled for a virtual visit with a  provider today. Just as with appointments in the office, your consent must be obtained to participate. Your consent will be active for this visit and any virtual visit you may have with one of our providers in the next 365 days. If you have a MyChart account, a copy of this consent can be sent to you electronically.  As this is a virtual visit, video technology does not allow for your provider to perform a traditional examination. This may limit your provider's ability to fully assess your condition. If your provider identifies any concerns that need to be evaluated in person or the need to arrange testing (such as labs, EKG, etc.), we will make arrangements to do so. Although advances in technology are sophisticated, we cannot ensure that it will always work on either your end or our end. If the connection with a video visit is poor, the visit may have to be switched to a telephone visit. With either a video or telephone visit, we are not always able to ensure that we have a secure connection.  By engaging in this virtual visit, you consent to the provision of healthcare and authorize for your insurance to be billed (if applicable) for the services provided during this visit. Depending on your insurance coverage, you may receive a charge related to this service.  I need to obtain your verbal consent now. Are you willing to proceed with your visit today? Tina Carrillo has provided verbal consent on 12/11/2023 for a virtual visit (video or telephone). Hyla Maillard, [place]  Date: 12/11/2023 10:29 AM   Virtual Visit via Video Note   I, Hyla Maillard, connected with  Tina Carrillo  ([id number], [birth year]) on 12/11/23 at 10:15 AM EDT by a video-enabled telemedicine application and verified that I am speaking with the correct person using two identifiers.  Location: Patient: Virtual Visit  Location Patient: Home Provider: Virtual Visit Location Provider: Home Office   I discussed the limitations of evaluation and management by telemedicine and the availability of in person appointments. The patient expressed understanding and agreed to proceed.    History of Present Illness: Tina Carrillo is a 53 y.o. who identifies as a female who was assigned female at birth, and is being seen today for 3 days of acute worsening of nasal congestion and sinus pressure, sinus pain with dark yellow-=green congestion. Notes some ongoing nasal congestion, rhinorrhea due to allergies for which she uses OTC flonase. Denies fever, chills, nausea or vomiting. Has adrenal insufficiency and always on low-dose steroids so when she gets sick it tends to progress quickly.   OTC -- Flonase, cough drops, itchy watery eyes.   HPI: HPI  Problems:  Patient Active Problem List   Diagnosis Date Noted   Vaginal agenesis, partial 05/19/2018   Mood disorder (HCC) 12/01/2017   Adjustment insomnia 12/01/2017   Adrenal mass (HCC) 05/08/2016   Type 2 diabetes mellitus without complication, without long-term current use of insulin (HCC) 10/17/2015   Environmental and seasonal allergies 06/14/2015   Congenital adrenal hyperplasia (HCC) 08/16/2014    Allergies:  Allergies  Allergen Reactions   Benzodiazepines Hives   Tramadol Nausea And Vomiting   Morphine And Codeine Nausea And Vomiting   Nucynta [Tapentadol] Nausea And Vomiting   Oxycodone Itching   Medications:  Current Outpatient Medications:    doxycycline (VIBRA-TABS) 100 MG tablet, Take 1 tablet (100  mg total) by mouth 2 (two) times daily., Disp: 20 tablet, Rfl: 0   fluticasone (FLONASE) 50 MCG/ACT nasal spray, Place 2 sprays into both nostrils daily., Disp: 16 g, Rfl: 0   ALPRAZolam (XANAX) 0.25 MG tablet, Take 0.25 mg by mouth at bedtime as needed for anxiety., Disp: , Rfl:    Blood Glucose Monitoring Suppl (FIFTY50 GLUCOSE METER 2.0) w/Device KIT,  Use as directed, Disp: , Rfl:    ciprofloxacin-dexamethasone (CIPRODEX) OTIC suspension, Place 4 drops into both ears 2 (two) times daily for 5 days. (DOS 06/25/23), Disp: 7.5 mL, Rfl: 0   fludrocortisone (FLORINEF) 0.1 MG tablet, Take 0.1 mg by mouth daily. Lifetime., Disp: , Rfl:    glucose blood (PRECISION QID TEST) test strip, Use once daily Use as instructed., Disp: , Rfl:    ibuprofen (ADVIL,MOTRIN) 200 MG tablet, Take 200 mg by mouth every 6 (six) hours as needed., Disp: , Rfl:    ondansetron (ZOFRAN) 4 MG tablet, Take 1 tablet (4 mg total) by mouth every 8 (eight) hours as needed for nausea or vomiting., Disp: 20 tablet, Rfl: 0   predniSONE (DELTASONE) 10 MG tablet, Take 20 mg by mouth 2 (two) times daily with a meal. Lifetime., Disp: , Rfl:    promethazine (PHENERGAN) 12.5 MG tablet, Take 12.5 mg by mouth every 8 (eight) hours as needed., Disp: , Rfl:   Observations/Objective: Patient is well-developed, well-nourished in no acute distress.  Resting comfortably at home.  Head is normocephalic, atraumatic.  No labored breathing. Speech is clear and coherent with logical content.  Patient is alert and oriented at baseline.   Assessment and Plan: 1. Acute bacterial sinusitis (Primary) - fluticasone (FLONASE) 50 MCG/ACT nasal spray; Place 2 sprays into both nostrils daily.  Dispense: 16 g; Refill: 0 - doxycycline (VIBRA-TABS) 100 MG tablet; Take 1 tablet (100 mg total) by mouth 2 (two) times daily.  Dispense: 20 tablet; Refill: 0  Rx Doxycycline.  Increase fluids.  Rest.  Saline nasal spray.  Probiotic.  Mucinex as directed.  Humidifier in bedroom. Flonase refilled.  Call or return to clinic if symptoms are not improving.   Follow Up Instructions: I discussed the assessment and treatment plan with the patient. The patient was provided an opportunity to ask questions and all were answered. The patient agreed with the plan and demonstrated an understanding of the instructions.  A copy of  instructions were sent to the patient via MyChart unless otherwise noted below.   The patient was advised to call back or seek an in-person evaluation if the symptoms worsen or if the condition fails to improve as anticipated.    Hyla Maillard, PA-C

## 2023-12-18 DIAGNOSIS — I499 Cardiac arrhythmia, unspecified: Secondary | ICD-10-CM | POA: Diagnosis not present

## 2023-12-18 DIAGNOSIS — E119 Type 2 diabetes mellitus without complications: Secondary | ICD-10-CM | POA: Diagnosis not present

## 2023-12-18 DIAGNOSIS — E250 Congenital adrenogenital disorders associated with enzyme deficiency: Secondary | ICD-10-CM | POA: Diagnosis not present

## 2024-02-03 DIAGNOSIS — Z1231 Encounter for screening mammogram for malignant neoplasm of breast: Secondary | ICD-10-CM | POA: Diagnosis not present

## 2024-02-20 ENCOUNTER — Telehealth: Admitting: Physician Assistant

## 2024-02-20 DIAGNOSIS — W19XXXA Unspecified fall, initial encounter: Secondary | ICD-10-CM | POA: Diagnosis not present

## 2024-02-20 DIAGNOSIS — S39012A Strain of muscle, fascia and tendon of lower back, initial encounter: Secondary | ICD-10-CM | POA: Diagnosis not present

## 2024-02-20 MED ORDER — CYCLOBENZAPRINE HCL 10 MG PO TABS: 5.0000 mg | ORAL_TABLET | Freq: Three times a day (TID) | ORAL | 0 refills | Status: AC | PRN

## 2024-02-20 MED ORDER — NAPROXEN 500 MG PO TABS: 500.0000 mg | ORAL_TABLET | Freq: Two times a day (BID) | ORAL | 0 refills | Status: DC

## 2024-02-20 NOTE — Progress Notes (Signed)

## 2024-03-08 DIAGNOSIS — R1013 Epigastric pain: Secondary | ICD-10-CM | POA: Diagnosis not present

## 2024-03-08 DIAGNOSIS — I81 Portal vein thrombosis: Secondary | ICD-10-CM | POA: Diagnosis not present

## 2024-04-06 ENCOUNTER — Telehealth: Admitting: Physician Assistant

## 2024-04-06 DIAGNOSIS — R3989 Other symptoms and signs involving the genitourinary system: Secondary | ICD-10-CM

## 2024-04-06 MED ORDER — CIPROFLOXACIN HCL 500 MG PO TABS: 500.0000 mg | ORAL_TABLET | Freq: Two times a day (BID) | ORAL | 0 refills | Status: AC

## 2024-04-06 NOTE — Patient Instructions (Signed)
 Tina Carrillo, thank you for joining Elsie Velma Lunger, PA-C for today's virtual visit.  While this provider is not your primary care provider (PCP), if your PCP is located in our provider database this encounter information will be shared with them immediately following your visit.   A [facility] MyChart account gives you access to today's visit and all your visits, tests, and labs performed at [facility]  click here if you don't have a [facility] MyChart account or go to [email or web address]  Consent: (Patient) Tina Carrillo provided verbal consent for this virtual visit at the beginning of the encounter.  Current Medications:  Current Outpatient Medications:    ALPRAZolam (XANAX) 0.25 MG tablet, Take 0.25 mg by mouth at bedtime as needed for anxiety., Disp: , Rfl:    Blood Glucose Monitoring Suppl (FIFTY50 GLUCOSE METER 2.0) w/Device KIT, Use as directed, Disp: , Rfl:    cyclobenzaprine  (FLEXERIL ) 10 MG tablet, Take 0.5-1 tablets (5-10 mg total) by mouth 3 (three) times daily as needed., Disp: 30 tablet, Rfl: 0   fludrocortisone (FLORINEF) 0.1 MG tablet, Take 0.1 mg by mouth daily. Lifetime., Disp: , Rfl:    glucose blood (PRECISION QID TEST) test strip, Use once daily Use as instructed., Disp: , Rfl:    ibuprofen  (ADVIL ,MOTRIN ) 200 MG tablet, Take 200 mg by mouth every 6 (six) hours as needed., Disp: , Rfl:    predniSONE (DELTASONE) 10 MG tablet, Take 20 mg by mouth 2 (two) times daily with a meal. Lifetime., Disp: , Rfl:    promethazine (PHENERGAN) 12.5 MG tablet, Take 12.5 mg by mouth every 8 (eight) hours as needed., Disp: , Rfl:    Medications ordered in this encounter:  No orders of the defined types were placed in this encounter.    *If you need refills on other medications prior to your next appointment, please contact your pharmacy*  Follow-Up: Call back or seek an in-person evaluation if the symptoms worsen or if the condition fails to improve as  anticipated.  [facility] [phone]  Other Instructions Your symptoms are consistent with a bladder infection, also called acute cystitis. Please take your antibiotic (Cipro ) as directed until all pills are gone.  Stay very well hydrated.  Consider a daily probiotic (Align, Culturelle, or Activia) to help prevent stomach upset caused by the antibiotic.  Taking a probiotic daily may also help prevent recurrent UTIs.  Also consider taking AZO (Phenazopyridine) tablets to help decrease pain with urination.    Urinary Tract Infection A urinary tract infection (UTI) can occur any place along the urinary tract. The tract includes the kidneys, ureters, bladder, and urethra. A type of germ called bacteria often causes a UTI. UTIs are often helped with antibiotic medicine.  HOME CARE  If given, take antibiotics as told by your doctor. Finish them even if you start to feel better. Drink enough fluids to keep your pee (urine) clear or pale yellow. Avoid tea, drinks with caffeine, and bubbly (carbonated) drinks. Pee often. Avoid holding your pee in for a long time. Pee before and after having sex (intercourse). Wipe from front to back after you poop (bowel movement) if you are a woman. Use each tissue only once. GET HELP RIGHT AWAY IF:  You have back pain. You have lower belly (abdominal) pain. You have chills. You feel sick to your stomach (nauseous). You throw up (vomit). Your burning or discomfort with peeing does not go away. You have a fever. Your symptoms  are not better in 3 days. MAKE SURE YOU:  Understand these instructions. Will watch your condition. Will get help right away if you are not doing well or get worse. Document Released: 01/29/2008 Document Revised: 05/06/2012 Document Reviewed: 03/12/2012 [facility] Patient Information 2015 [place], [place]. This information is not intended to replace advice given to you by your health care provider. Make sure you discuss any  questions you have with your health care provider.    If you have been instructed to have an in-person evaluation today at a local Urgent Care facility, please use the link below. It will take you to a list of all of our available [facility], including address, phone number and hours of operation. Please do not delay care.  [facility]  If you or a family member do not have a primary care provider, use the link below to schedule a visit and establish care. When you choose a [facility] primary care physician or advanced practice provider, you gain a long-term partner in health. Find a Primary Care Provider  Learn more about [facility] in-office and virtual care options: [facility] - Get Care Now

## 2024-04-06 NOTE — Progress Notes (Signed)
 Virtual Visit Consent   Tina Carrillo, you are scheduled for a virtual visit with a [facility] provider today. Just as with appointments in the office, your consent must be obtained to participate. Your consent will be active for this visit and any virtual visit you may have with one of our providers in the next 365 days. If you have a MyChart account, a copy of this consent can be sent to you electronically.  As this is a virtual visit, video technology does not allow for your provider to perform a traditional examination. This may limit your provider's ability to fully assess your condition. If your provider identifies any concerns that need to be evaluated in person or the need to arrange testing (such as labs, EKG, etc.), we will make arrangements to do so. Although advances in technology are sophisticated, we cannot ensure that it will always work on either your end or our end. If the connection with a video visit is poor, the visit may have to be switched to a telephone visit. With either a video or telephone visit, we are not always able to ensure that we have a secure connection.  By engaging in this virtual visit, you consent to the provision of healthcare and authorize for your insurance to be billed (if applicable) for the services provided during this visit. Depending on your insurance coverage, you may receive a charge related to this service.  I need to obtain your verbal consent now. Are you willing to proceed with your visit today? [name] has provided verbal consent on 04/06/2024 for a virtual visit (video or telephone). Tina Carrillo, [place]  Date: 04/06/2024 8:21 AM   Virtual Visit via Video Note   I, Tina Carrillo, connected with  Tina Carrillo  ([id number], October 17, 1970) on 04/06/24 at  8:15 AM EDT by a video-enabled telemedicine application and verified that I am speaking with the correct person using two identifiers.  Location: Patient: Virtual Visit Location  Patient: Home Provider: Virtual Visit Location Provider: Home Office   I discussed the limitations of evaluation and management by telemedicine and the availability of in person appointments. The patient expressed understanding and agreed to proceed.    History of Present Illness: KATALEIA QUARANTA is a 53 y.o. who identifies as a female who was assigned female at birth, and is being seen today for possible UTI. Notes she went to bed last night with sensation of some Lower R back pain, thought was a muscle spasm. Took flexeril  without improvement. Overnight with urgency and episode of mild incontinence. Denies nausea/vomiting, belly pain. Some cloudiness of urine with odor. Denies hematuria.    HPI: HPI  Problems:  Patient Active Problem List   Diagnosis Date Noted   Vaginal agenesis, partial 05/19/2018   Mood disorder (HCC) 12/01/2017   Adjustment insomnia 12/01/2017   Adrenal mass (HCC) 05/08/2016   Type 2 diabetes mellitus without complication, without long-term current use of insulin (HCC) 10/17/2015   Environmental and seasonal allergies 06/14/2015   Congenital adrenal hyperplasia (HCC) 08/16/2014    Allergies:  Allergies  Allergen Reactions   Benzodiazepines Hives   Tramadol  Nausea And Vomiting   Morphine And Codeine  Nausea And Vomiting   Nucynta [Tapentadol] Nausea And Vomiting   Oxycodone Itching   Medications:  Current Outpatient Medications:    ciprofloxacin  (CIPRO ) 500 MG tablet, Take 1 tablet (500 mg total) by mouth 2 (two) times daily for 3 days., Disp: 6 tablet, Rfl: 0  ALPRAZolam (XANAX) 0.25 MG tablet, Take 0.25 mg by mouth at bedtime as needed for anxiety., Disp: , Rfl:    Blood Glucose Monitoring Suppl (FIFTY50 GLUCOSE METER 2.0) w/Device KIT, Use as directed, Disp: , Rfl:    cyclobenzaprine  (FLEXERIL ) 10 MG tablet, Take 0.5-1 tablets (5-10 mg total) by mouth 3 (three) times daily as needed., Disp: 30 tablet, Rfl: 0   fludrocortisone (FLORINEF) 0.1 MG tablet, Take  0.1 mg by mouth daily. Lifetime., Disp: , Rfl:    glucose blood (PRECISION QID TEST) test strip, Use once daily Use as instructed., Disp: , Rfl:    ibuprofen  (ADVIL ,MOTRIN ) 200 MG tablet, Take 200 mg by mouth every 6 (six) hours as needed., Disp: , Rfl:    predniSONE (DELTASONE) 10 MG tablet, Take 20 mg by mouth 2 (two) times daily with a meal. Lifetime., Disp: , Rfl:    promethazine (PHENERGAN) 12.5 MG tablet, Take 12.5 mg by mouth every 8 (eight) hours as needed., Disp: , Rfl:   Observations/Objective: Patient is well-developed, well-nourished in no acute distress.  Resting comfortably at home.  Head is normocephalic, atraumatic.  No labored breathing. Speech is clear and coherent with logical content.  Patient is alert and oriented at baseline.   Assessment and Plan: 1. Suspected UTI (Primary) - ciprofloxacin  (CIPRO ) 500 MG tablet; Take 1 tablet (500 mg total) by mouth 2 (two) times daily for 3 days.  Dispense: 6 tablet; Refill: 0  Classic UTI symptoms with absence of alarm signs or symptoms. Prior history of UTI. Will treat empirically with Cipro  (patient preference) for suspected uncomplicated cystitis. Supportive measures and OTC medications reviewed. Strict in-person evaluation precautions discussed.    Follow Up Instructions: I discussed the assessment and treatment plan with the patient. The patient was provided an opportunity to ask questions and all were answered. The patient agreed with the plan and demonstrated an understanding of the instructions.  A copy of instructions were sent to the patient via MyChart unless otherwise noted below.   The patient was advised to call back or seek an in-person evaluation if the symptoms worsen or if the condition fails to improve as anticipated.    Tina Velma Lunger, PA-C

## 2024-04-22 DIAGNOSIS — J45909 Unspecified asthma, uncomplicated: Secondary | ICD-10-CM | POA: Diagnosis not present

## 2024-04-22 DIAGNOSIS — K5903 Drug induced constipation: Secondary | ICD-10-CM | POA: Diagnosis not present

## 2024-04-22 DIAGNOSIS — Z853 Personal history of malignant neoplasm of breast: Secondary | ICD-10-CM | POA: Diagnosis not present

## 2024-04-22 DIAGNOSIS — R1030 Lower abdominal pain, unspecified: Secondary | ICD-10-CM | POA: Diagnosis not present

## 2024-04-22 DIAGNOSIS — I10 Essential (primary) hypertension: Secondary | ICD-10-CM | POA: Diagnosis not present

## 2024-04-22 DIAGNOSIS — E876 Hypokalemia: Secondary | ICD-10-CM | POA: Diagnosis not present

## 2024-04-28 DIAGNOSIS — E1169 Type 2 diabetes mellitus with other specified complication: Secondary | ICD-10-CM | POA: Diagnosis not present

## 2024-04-28 DIAGNOSIS — E782 Mixed hyperlipidemia: Secondary | ICD-10-CM | POA: Diagnosis not present

## 2024-04-29 DIAGNOSIS — E250 Congenital adrenogenital disorders associated with enzyme deficiency: Secondary | ICD-10-CM | POA: Diagnosis not present

## 2024-04-29 DIAGNOSIS — E1169 Type 2 diabetes mellitus with other specified complication: Secondary | ICD-10-CM | POA: Diagnosis not present

## 2024-04-29 DIAGNOSIS — E278 Other specified disorders of adrenal gland: Secondary | ICD-10-CM | POA: Diagnosis not present

## 2024-04-29 DIAGNOSIS — D1779 Benign lipomatous neoplasm of other sites: Secondary | ICD-10-CM | POA: Diagnosis not present

## 2024-05-10 ENCOUNTER — Other Ambulatory Visit: Payer: Self-pay | Admitting: Family Medicine

## 2024-05-10 DIAGNOSIS — Z1231 Encounter for screening mammogram for malignant neoplasm of breast: Secondary | ICD-10-CM

## 2024-05-31 ENCOUNTER — Ambulatory Visit
Admission: RE | Admit: 2024-05-31 | Discharge: 2024-05-31 | Disposition: A | Discharge status: Home / Self Care | Source: Ambulatory Visit | Attending: Family Medicine | Admitting: Family Medicine

## 2024-05-31 DIAGNOSIS — Z1231 Encounter for screening mammogram for malignant neoplasm of breast: Secondary | ICD-10-CM | POA: Insufficient documentation

## 2024-06-04 ENCOUNTER — Other Ambulatory Visit: Payer: Self-pay | Admitting: Family Medicine

## 2024-06-04 DIAGNOSIS — R928 Other abnormal and inconclusive findings on diagnostic imaging of breast: Secondary | ICD-10-CM

## 2024-06-07 ENCOUNTER — Other Ambulatory Visit: Payer: Self-pay | Admitting: Family Medicine

## 2024-06-07 ENCOUNTER — Ambulatory Visit
Admission: RE | Admit: 2024-06-07 | Discharge: 2024-06-07 | Disposition: A | Discharge status: Home / Self Care | Source: Ambulatory Visit | Attending: Family Medicine | Admitting: Family Medicine

## 2024-06-07 DIAGNOSIS — R928 Other abnormal and inconclusive findings on diagnostic imaging of breast: Secondary | ICD-10-CM

## 2024-06-07 DIAGNOSIS — R92321 Mammographic fibroglandular density, right breast: Secondary | ICD-10-CM | POA: Diagnosis not present

## 2024-06-07 DIAGNOSIS — N6311 Unspecified lump in the right breast, upper outer quadrant: Secondary | ICD-10-CM | POA: Diagnosis not present

## 2024-06-08 ENCOUNTER — Ambulatory Visit
Admission: RE | Admit: 2024-06-08 | Discharge: 2024-06-08 | Disposition: A | Discharge status: Home / Self Care | Source: Ambulatory Visit | Attending: Family Medicine | Admitting: Family Medicine

## 2024-06-08 DIAGNOSIS — D0511 Intraductal carcinoma in situ of right breast: Secondary | ICD-10-CM | POA: Diagnosis not present

## 2024-06-08 DIAGNOSIS — R928 Other abnormal and inconclusive findings on diagnostic imaging of breast: Secondary | ICD-10-CM | POA: Insufficient documentation

## 2024-06-08 DIAGNOSIS — N6311 Unspecified lump in the right breast, upper outer quadrant: Secondary | ICD-10-CM | POA: Diagnosis not present

## 2024-06-08 HISTORY — PX: BREAST BIOPSY: SHX20

## 2024-06-08 MED ORDER — LIDOCAINE-EPINEPHRINE 1 %-1:100000 IJ SOLN
8.0000 mL | Freq: Once | INTRAMUSCULAR | Status: AC
Start: 2024-06-08 — End: 2024-06-08
  Administered 2024-06-08: 8 mL

## 2024-06-08 MED ORDER — LIDOCAINE 1 % OPTIME INJ - NO CHARGE
2.0000 mL | Freq: Once | INTRAMUSCULAR | Status: AC
  Administered 2024-06-08: 2 mL via INTRADERMAL
  Filled 2024-06-08: qty 2

## 2024-06-09 ENCOUNTER — Encounter: Payer: Self-pay | Admitting: *Deleted

## 2024-06-09 DIAGNOSIS — D0511 Intraductal carcinoma in situ of right breast: Secondary | ICD-10-CM

## 2024-06-09 LAB — SURGICAL PATHOLOGY

## 2024-06-09 NOTE — Addendum Note (Signed)
 Addended by: VERDENE GILLS on: 06/09/2024 12:06 PM   Modules accepted: Orders

## 2024-06-09 NOTE — Progress Notes (Addendum)
 Called and spoke with patient to review upcoming appts to see Dr. [name] on Monday 10/20 at 11:15am and Dr. Tye at [facility] at 3pm. Nothing further needed at this time.

## 2024-06-09 NOTE — Progress Notes (Addendum)
 Referral received for newly diagnosed breast cancer and needs coordination for surgery and oncology consults. Called and spoke with patient to assist with scheduling appts. Pt stated that she wants to discuss with her sister to determine her provider preference. Contact number given to patient and instructed to call back once she knows her preference. Awaiting call back.   Spoke with and she prefers to see Dr. [name] and any surgeon at [facility]. Will call pt back with appts once scheduled.

## 2024-06-14 ENCOUNTER — Inpatient Hospital Stay: Attending: Oncology | Admitting: Oncology

## 2024-06-14 ENCOUNTER — Encounter: Payer: Self-pay | Admitting: Oncology

## 2024-06-14 ENCOUNTER — Inpatient Hospital Stay

## 2024-06-14 VITALS — BP 150/80 | HR 111 | Temp 98.9°F | Resp 18 | Wt 183.0 lb

## 2024-06-14 DIAGNOSIS — Z87730 Personal history of (corrected) cleft lip and palate: Secondary | ICD-10-CM | POA: Diagnosis not present

## 2024-06-14 DIAGNOSIS — E250 Congenital adrenogenital disorders associated with enzyme deficiency: Secondary | ICD-10-CM | POA: Diagnosis not present

## 2024-06-14 DIAGNOSIS — Z8249 Family history of ischemic heart disease and other diseases of the circulatory system: Secondary | ICD-10-CM | POA: Diagnosis not present

## 2024-06-14 DIAGNOSIS — K824 Cholesterolosis of gallbladder: Secondary | ICD-10-CM | POA: Diagnosis not present

## 2024-06-14 DIAGNOSIS — Z8049 Family history of malignant neoplasm of other genital organs: Secondary | ICD-10-CM | POA: Diagnosis not present

## 2024-06-14 DIAGNOSIS — F17210 Nicotine dependence, cigarettes, uncomplicated: Secondary | ICD-10-CM | POA: Insufficient documentation

## 2024-06-14 DIAGNOSIS — Z808 Family history of malignant neoplasm of other organs or systems: Secondary | ICD-10-CM | POA: Insufficient documentation

## 2024-06-14 DIAGNOSIS — D0511 Intraductal carcinoma in situ of right breast: Secondary | ICD-10-CM | POA: Insufficient documentation

## 2024-06-14 DIAGNOSIS — Z833 Family history of diabetes mellitus: Secondary | ICD-10-CM | POA: Insufficient documentation

## 2024-06-14 DIAGNOSIS — L700 Acne vulgaris: Secondary | ICD-10-CM | POA: Diagnosis not present

## 2024-06-14 DIAGNOSIS — Z79899 Other long term (current) drug therapy: Secondary | ICD-10-CM | POA: Diagnosis not present

## 2024-06-14 DIAGNOSIS — Z9049 Acquired absence of other specified parts of digestive tract: Secondary | ICD-10-CM | POA: Insufficient documentation

## 2024-06-14 DIAGNOSIS — Z803 Family history of malignant neoplasm of breast: Secondary | ICD-10-CM | POA: Diagnosis not present

## 2024-06-14 DIAGNOSIS — Z885 Allergy status to narcotic agent status: Secondary | ICD-10-CM | POA: Insufficient documentation

## 2024-06-14 DIAGNOSIS — Z8041 Family history of malignant neoplasm of ovary: Secondary | ICD-10-CM | POA: Insufficient documentation

## 2024-06-14 DIAGNOSIS — N6489 Other specified disorders of breast: Secondary | ICD-10-CM | POA: Diagnosis not present

## 2024-06-14 DIAGNOSIS — Z818 Family history of other mental and behavioral disorders: Secondary | ICD-10-CM | POA: Diagnosis not present

## 2024-06-14 NOTE — Progress Notes (Unsigned)
 [facility]  [phone] [phone]  ID: Tina Carrillo OB: [birth year]  MR#: [id number]  RDW#:[id number]  [name], NP as PCP - General (Family Medicine)  CHIEF COMPLAINT: DCIS, right breast.  INTERVAL HISTORY: Patient is a 53 year old female who underwent a screening mammogram that revealed a suspicious abnormality.  Subsequent ultrasound biopsy revealed the above pathology.  She is anxious, but otherwise feels well.  She has no neurologic complaints.  She denies any recent fevers or illnesses.  She has a good appetite and denies weight loss.  She has no chest pain, shortness of breath, cough, or hemoptysis.  She denies any nausea, vomiting, constipation, or diarrhea.  She has no urinary complaints.  Patient offers no further specific complaints today.  REVIEW OF SYSTEMS:   Review of Systems  Constitutional: Negative.  Negative for fever, malaise/fatigue and weight loss.  Respiratory: Negative.  Negative for cough, hemoptysis and shortness of breath.   Cardiovascular: Negative.  Negative for chest pain and leg swelling.  Gastrointestinal: Negative.  Negative for abdominal pain.  Genitourinary: Negative.  Negative for dysuria.  Musculoskeletal: Negative.  Negative for back pain.  Skin: Negative.  Negative for rash.  Neurological: Negative.  Negative for dizziness, focal weakness, weakness and headaches.  Psychiatric/Behavioral:  The patient is nervous/anxious.     As per HPI. Otherwise, a complete review of systems is negative.  PAST MEDICAL HISTORY: Past Medical History:  Diagnosis Date   Adrenal benign tumor    Adrenal hyperplasia    Diabetes mellitus without complication (HCC)    Dysrhythmia    GERD (gastroesophageal reflux disease)    History of repair of congenital cleft palate    PONV (postoperative nausea and vomiting)    Sleep apnea     PAST SURGICAL HISTORY: Past Surgical History:  Procedure  Laterality Date   BREAST BIOPSY Right 06/08/2024   US  RT BREAST BX W LOC DEV 1ST LESION IMG BX SPEC US  GUIDE 06/08/2024 [facility]   CHOLECYSTECTOMY     CLEFT PALATE REPAIR     MYRINGOTOMY WITH TUBE PLACEMENT Bilateral 06/25/2023   Procedure: MYRINGOTOMY WITH TUBE PLACEMENT (BUTTERFLY TUBES);  Surgeon: [name], MD;  Location: [facility];  Service: ENT;  Laterality: Bilateral;   VAGINA RECONSTRUCTION SURGERY  1985    FAMILY HISTORY: Family History  Problem Relation Age of Onset   Ovarian cancer Mother    Bipolar disorder Mother    Diabetes Mother    Uterine cancer Sister    Breast cancer Sister 50       mat half sister   Hypertension Father    Heart disease Father    Diabetes Maternal Grandmother    Brain cancer Paternal Grandmother     ADVANCED DIRECTIVES (Y/N):  N  HEALTH MAINTENANCE: Social History   Tobacco Use   Smoking status: Some Days    Current packs/day: 0.25    Average packs/day: 0.3 packs/day for 7.2 years (1.8 ttl pk-yrs)    Types: Cigarettes    Start date: 03/17/2017   Smokeless tobacco: Never  Vaping Use   Vaping status: Never Used  Substance Use Topics   Alcohol use: Yes    Alcohol/week: 1.0 standard drink of alcohol    Types: 1 Cans of beer per week   Drug use: No     Colonoscopy:  PAP:  Bone density:  Lipid panel:  Allergies  Allergen Reactions   Benzodiazepines Hives   Tramadol  Nausea And Vomiting  Morphine  And Codeine  Nausea And Vomiting   Nucynta [Tapentadol] Nausea And Vomiting   Oxycodone Itching    Current Outpatient Medications  Medication Sig Dispense Refill   acetaminophen  (TYLENOL ) 325 MG tablet Take 650 mg by mouth.     ALPRAZolam (XANAX) 0.5 MG tablet Take 0.5 mg by mouth at bedtime as needed for anxiety.     Blood Glucose Monitoring Suppl (FIFTY50 GLUCOSE METER 2.0) w/Device KIT Use as directed     cyclobenzaprine  (FLEXERIL ) 10 MG tablet Take 0.5-1 tablets (5-10 mg total) by mouth 3 (three) times  daily as needed. 30 tablet 0   famotidine (PEPCID) 20 MG tablet Take 20 mg by mouth.     fludrocortisone (FLORINEF) 0.1 MG tablet Take 0.1 mg by mouth daily. Lifetime.     glucose blood (PRECISION QID TEST) test strip Use once daily Use as instructed.     ibuprofen  (ADVIL ,MOTRIN ) 200 MG tablet Take 200 mg by mouth every 6 (six) hours as needed.     Multiple Vitamin (MULTIVITAMIN) tablet Take 1 tablet by mouth daily.     predniSONE (DELTASONE) 10 MG tablet Take 20 mg by mouth 2 (two) times daily with a meal. Lifetime.     promethazine  (PHENERGAN ) 12.5 MG tablet Take 12.5 mg by mouth every 8 (eight) hours as needed.     simvastatin (ZOCOR) 40 MG tablet Take 40 mg by mouth at bedtime.     SitaGLIPtin-MetFORMIN HCl 50-1000 MG TB24 Take 2 tablets by mouth.     No current facility-administered medications for this visit.    OBJECTIVE: Vitals:   06/14/24 1114  BP: (!) 150/80  Pulse: (!) 111  Resp: 18  Temp: 98.9 F (37.2 C)  SpO2: 100%     Body mass index is 36.96 kg/m.    ECOG FS:0 - Asymptomatic  General: Well-developed, well-nourished, no acute distress. Eyes: Pink conjunctiva, anicteric sclera. HEENT: Normocephalic, moist mucous membranes. Lungs: No audible wheezing or coughing. Heart: Regular rate and rhythm. Abdomen: Soft, nontender, no obvious distention. Musculoskeletal: No edema, cyanosis, or clubbing. Neuro: Alert, answering all questions appropriately. Cranial nerves grossly intact. Skin: No rashes or petechiae noted. Psych: Normal affect. Lymphatics: No cervical, calvicular, axillary or inguinal LAD.   LAB RESULTS:  Lab Results  Component Value Date   NA 140 09/08/2018   K 3.8 09/08/2018   CL 102 09/08/2018   CO2 28 09/08/2018   GLUCOSE 146 (H) 09/08/2018   BUN 17 09/08/2018   CREATININE 0.75 09/08/2018   CALCIUM 9.4 09/08/2018   PROT 7.8 05/19/2018   ALBUMIN 4.4 05/19/2018   AST 21 05/19/2018   ALT 28 05/19/2018   ALKPHOS 71 05/19/2018   BILITOT 0.4  05/19/2018   GFRNONAA >60 09/08/2018   GFRAA >60 09/08/2018    Lab Results  Component Value Date   WBC 15.1 (H) 09/08/2018   NEUTROABS 11.6 (H) 09/08/2018   HGB 14.5 09/08/2018   HCT 44.6 09/08/2018   MCV 86.6 09/08/2018   PLT 295 09/08/2018     STUDIES: US  RT BREAST BX W LOC DEV 1ST LESION IMG BX SPEC US  GUIDE Addendum Date: 06/09/2024 ADDENDUM REPORT: 06/09/2024 11:12 ADDENDUM: PATHOLOGY revealed: 1. Breast, right, needle core biopsy, mass, 10:00 10 cmfn, 18mm (ribbon clip) - DUCTAL CARCINOMA IN SITU, COMEDO, HIGH NUCLEAR GRADE FOCALLY SUSPICIOUS FOR MICROINVASION - NECROSIS: PRESENT - CALCIFICATIONS: NOT IDENTIFIED - DCIS LENGTH: 1.1 CM. Pathology results are CONCORDANT with imaging findings, per Dr. Norleen Croak. Pathology results and recommendations were discussed with patient via  telephone on 06/09/2024 by Jacquline Cooter RN. Patient reported biopsy site doing well with no adverse symptoms, and slight tenderness at the site. Post biopsy care instructions were reviewed, questions were answered and my direct phone number was provided. Patient was instructed to call [facility] for any additional questions or concerns related to biopsy site. RECOMMENDATIONS: 1. Surgical and oncological consultation. Request for surgical and oncological consultation relayed to [facility] RN and Rayfield Jasmine at [facility] by Jacquline Cooter RN on 06/09/2024. Pathology results reported by Jacquline Cooter RN on 06/09/2024. Electronically Signed   By: Norleen Croak M.D.   On: 06/09/2024 11:12   Result Date: 06/09/2024 CLINICAL DATA:  Indeterminate RIGHT breast mass EXAM: ULTRASOUND GUIDED RIGHT BREAST CORE NEEDLE BIOPSY COMPARISON:  Previous exam(s). PROCEDURE: I met with the patient and we discussed the procedure of ultrasound-guided biopsy, including benefits and alternatives. We discussed the high likelihood of a successful procedure. We discussed the risks of the  procedure, including infection, bleeding, tissue injury, clip migration, and inadequate sampling. Informed written consent was given. The usual time-out protocol was performed immediately prior to the procedure. Lesion quadrant: Upper-outer Using sterile technique and 1% lidocaine  and 1% lidocaine  with epinephrine as local anesthetic, under direct ultrasound visualization, a 14 gauge spring-loaded device was used to perform biopsy of the RIGHT breast mass at 10 o'clock 10 cm from the nipple using a anti radial approach. At the conclusion of the procedure ribbon shaped tissue marker clip was deployed into the biopsy cavity. Follow up 2 view mammogram was performed and dictated separately. IMPRESSION: Ultrasound guided biopsy of the RIGHT breast mass at 10 o'clock 10 cm from the nipple. No apparent complications. Electronically Signed: By: Norleen Croak M.D. On: 06/08/2024 08:27   MM CLIP PLACEMENT RIGHT Result Date: 06/08/2024 CLINICAL DATA:  Status post RIGHT breast ultrasound-guided biopsy EXAM: 3D DIAGNOSTIC RIGHT MAMMOGRAM POST ULTRASOUND BIOPSY COMPARISON:  Previous exam(s). ACR Breast Density Category b: There are scattered areas of fibroglandular density. FINDINGS: 3D Mammographic images were obtained following ultrasound guided biopsy of the RIGHT breast mass at 10 o'clock 10 cm from the nipple. The biopsy marking clip is in expected position at the site of biopsy. IMPRESSION: Appropriate positioning of the ribbon shaped biopsy marking clip at the site of biopsy in the RIGHT breast. Final Assessment: Post Procedure Mammograms for Marker Placement Electronically Signed   By: Norleen Croak M.D.   On: 06/08/2024 08:36   MM 3D DIAGNOSTIC MAMMOGRAM UNILATERAL RIGHT BREAST Result Date: 06/07/2024 CLINICAL DATA:  RIGHT breast callback EXAM: DIGITAL DIAGNOSTIC UNILATERAL RIGHT MAMMOGRAM WITH TOMOSYNTHESIS AND CAD; ULTRASOUND RIGHT BREAST LIMITED TECHNIQUE: Right digital diagnostic mammography and breast  tomosynthesis was performed. The images were evaluated with computer-aided detection. ; Targeted ultrasound examination of the right breast was performed COMPARISON:  Previous exam(s). ACR Breast Density Category b: There are scattered areas of fibroglandular density. FINDINGS: Spot compression tomosynthesis views demonstrate a persistent focal asymmetry in the RIGHT upper outer breast at posterior depth. On physical exam, no suspicious mass is appreciated Targeted ultrasound was performed of the RIGHT upper outer breast. At 10 o'clock 10 cm from the nipple, there is an irregular heterogeneously hypoechoic mass with indistinct margins. It measures approximately 18 x 12 x 13 mm. This corresponds to the site of mammographic concern. Targeted ultrasound was performed of the RIGHT axilla. No suspicious axillary lymph nodes are visualized. IMPRESSION: 1. An 18 mm mass in the RIGHT breast is indeterminate. Recommend ultrasound-guided biopsy for  definitive characterization. 2. No suspicious RIGHT axillary adenopathy. RECOMMENDATION: RIGHT breast ultrasound-guided biopsy x1 I have discussed the findings and recommendations with the patient. The biopsy procedure was discussed with the patient and questions were answered. Patient expressed their understanding of the biopsy recommendation. Patient will be scheduled for biopsy at her earliest convenience by the schedulers. Ordering provider will be notified. If applicable, a reminder letter will be sent to the patient regarding the next appointment. BI-RADS CATEGORY  4: Suspicious. Electronically Signed   By: Corean Salter M.D.   On: 06/07/2024 08:50   US  LIMITED ULTRASOUND INCLUDING AXILLA RIGHT BREAST Result Date: 06/07/2024 CLINICAL DATA:  RIGHT breast callback EXAM: DIGITAL DIAGNOSTIC UNILATERAL RIGHT MAMMOGRAM WITH TOMOSYNTHESIS AND CAD; ULTRASOUND RIGHT BREAST LIMITED TECHNIQUE: Right digital diagnostic mammography and breast tomosynthesis was performed. The  images were evaluated with computer-aided detection. ; Targeted ultrasound examination of the right breast was performed COMPARISON:  Previous exam(s). ACR Breast Density Category b: There are scattered areas of fibroglandular density. FINDINGS: Spot compression tomosynthesis views demonstrate a persistent focal asymmetry in the RIGHT upper outer breast at posterior depth. On physical exam, no suspicious mass is appreciated Targeted ultrasound was performed of the RIGHT upper outer breast. At 10 o'clock 10 cm from the nipple, there is an irregular heterogeneously hypoechoic mass with indistinct margins. It measures approximately 18 x 12 x 13 mm. This corresponds to the site of mammographic concern. Targeted ultrasound was performed of the RIGHT axilla. No suspicious axillary lymph nodes are visualized. IMPRESSION: 1. An 18 mm mass in the RIGHT breast is indeterminate. Recommend ultrasound-guided biopsy for definitive characterization. 2. No suspicious RIGHT axillary adenopathy. RECOMMENDATION: RIGHT breast ultrasound-guided biopsy x1 I have discussed the findings and recommendations with the patient. The biopsy procedure was discussed with the patient and questions were answered. Patient expressed their understanding of the biopsy recommendation. Patient will be scheduled for biopsy at her earliest convenience by the schedulers. Ordering provider will be notified. If applicable, a reminder letter will be sent to the patient regarding the next appointment. BI-RADS CATEGORY  4: Suspicious. Electronically Signed   By: Corean Salter M.D.   On: 06/07/2024 08:50   MM 3D SCREENING MAMMOGRAM BILATERAL BREAST Result Date: 06/02/2024 CLINICAL DATA:  Screening. EXAM: DIGITAL SCREENING BILATERAL MAMMOGRAM WITH TOMOSYNTHESIS AND CAD TECHNIQUE: Bilateral screening digital craniocaudal and mediolateral oblique mammograms were obtained. Bilateral screening digital breast tomosynthesis was performed. The images were  evaluated with computer-aided detection. COMPARISON:  Previous exam(s). ACR Breast Density Category b: There are scattered areas of fibroglandular density. FINDINGS: In the right breast, a possible mass warrants further evaluation. In the left breast, no findings suspicious for malignancy. IMPRESSION: Further evaluation is suggested for a possible mass in the right breast. RECOMMENDATION: Diagnostic mammogram and possibly ultrasound of the right breast. (Code:[id number]) The patient will be contacted regarding the findings, and additional imaging will be scheduled. BI-RADS CATEGORY  0: Incomplete: Need additional imaging evaluation. Electronically Signed   By: Dina  Arceo M.D.   On: 06/02/2024 16:30    ASSESSMENT: DCIS, right breast.  PLAN:    DCIS, right breast: Imaging and pathology reviewed independently.  Patient has an appointment with surgery later this afternoon to discuss lumpectomy.  She was initially hesitant to undergo radiation treatments, but now is agreeable.  Does not appear patient will require Oncotype testing or chemotherapy, but will await final pathology.  Given the ER status of her malignancy, she will benefit from letrozole for 5 years at the completion of  her treatments.  Return to clinic 2 to 3 weeks after her surgery to discuss her final pathology results and treatment planning.  Patient also had consultation with radiation oncology on this day. Congenital adrenal hyperplasia/diabetes: Continue follow-up and treatment per endocrinology. Genetics: Patient was given a referral to genetic counseling.  I spent a total of 60 minutes reviewing chart data, face-to-face evaluation with the patient, counseling and coordination of care as detailed above.   Patient expressed understanding and was in agreement with this plan. She also understands that She can call clinic at any time with any questions, concerns, or complaints.    Cancer Staging  Ductal carcinoma in situ (DCIS) of right  breast Staging form: Breast, AJCC 8th Edition - Clinical stage from 06/15/2024: Stage 0 (cTis (DCIS), cN0, cM0, G2, ER+, PR-, HER2: Not Assessed) - Signed by [name] [name], MD on 06/15/2024 Stage prefix: Initial diagnosis Histologic grading system: 3 grade system   [name] [name], MD   06/15/2024 9:13 AM

## 2024-06-14 NOTE — Progress Notes (Unsigned)
 Patient is very anxious, she wants to make sure that the doctor here is close contact with her Endocrinologist, about her hormones.

## 2024-06-15 ENCOUNTER — Ambulatory Visit: Payer: Self-pay | Admitting: Surgery

## 2024-06-15 DIAGNOSIS — E278 Other specified disorders of adrenal gland: Secondary | ICD-10-CM | POA: Diagnosis not present

## 2024-06-15 DIAGNOSIS — D0511 Intraductal carcinoma in situ of right breast: Secondary | ICD-10-CM | POA: Diagnosis not present

## 2024-06-15 DIAGNOSIS — E250 Congenital adrenogenital disorders associated with enzyme deficiency: Secondary | ICD-10-CM | POA: Diagnosis not present

## 2024-06-15 DIAGNOSIS — E1169 Type 2 diabetes mellitus with other specified complication: Secondary | ICD-10-CM | POA: Diagnosis not present

## 2024-06-15 NOTE — H&P (Signed)
 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 Change was noted on last screening mammogram.  Hx of 21-hydroxylase deficiency.   History of Present Illness       Past Medical History:  has a past medical history of Adrenal adenoma (2017), Congenital adrenal hyperplasia (CMS/HHS-HCC), MASLD, Post-menopausal, and Type 2 diabetes mellitus (CMS/HHS-HCC).   Past Surgical History:  has a past surgical history that includes vaginal reconstruction; Cleft palate repair; BILATERAL TUBES IN EARS; and Cholecystectomy.   Family History: family history includes Bipolar disorder in her mother; Colon cancer in her maternal grandmother; Colon polyps in her father; Diabetes in her maternal grandmother; High blood pressure (Hypertension) in her sister; Irregular Heart Beat (Arrhythmia) in her father; Other in her mother; Ovarian cancer in her sister.   Social History:  reports that she has quit smoking. She has never used smokeless tobacco. She reports current alcohol use. She reports that she does not use drugs.   Current Medications: has a current medication list which includes the following prescription(s): acetaminophen , alprazolam, freestyle libre 3 plus sensor, cyclobenzaprine , fludrocortisone, fluticasone  propionate, multivitamin, prednisone, simvastatin, and sitagliptin phos-metformin.   Allergies:       Allergies as of 06/14/2024 - Reviewed 04/29/2024  Allergen Reaction Noted   Tramadol  Nausea And Vomiting 12/18/2016   Morphine  Vomiting and Nausea And Vomiting 02/10/2014   Oxycodone Itching 06/01/2015   Tapentadol Nausea And Vomiting 01/08/2016      ROS:  A 15 point review of systems was performed and was negative except as noted in HPI   Objective:    Objective BP 126/83 (BP Location: Left upper arm, Patient Position: Sitting, BP Cuff Size: Adult)   Pulse (!) 115   Ht 149.9 cm (4' 11)   Wt 81.6 kg (180 lb)    BMI 36.36 kg/m    Constitutional :  No distress, cooperative, alert  Lymphatics/Throat:  Supple with no lymphadenopathy  Respiratory:  Clear to auscultation bilaterally  Cardiovascular:  Regular rate and rhythm  Gastrointestinal: Soft, non-tender, non-distended, no organomegaly.  Musculoskeletal: Steady gait and movement  Skin: Cool and moist  Psychiatric: Normal affect, non-agitated, not confused  Breast: Normal appearance and no palpable abnormality in bilateral breasts and axilla.  Chaperone present for exam.      LABS:  SURGICAL PATHOLOGY SURGICAL PATHOLOGY [facility] [address], [address] [place], [place] [address] Telephone [phone] or [phone] Fax [phone]  REPORT OF SURGICAL PATHOLOGY   Accession #: [id number] Patient Name: Tina Carrillo, Tina Carrillo Visit # : [id number]  MRN: [id number] Physician: Anice PONCE Rush DOB/Age Nov 13, 1970 (Age: 53) Gender: F Collected Date: 06/08/2024 Received Date: 06/08/2024  FINAL DIAGNOSIS       1. Breast, right, needle core biopsy, mass, 10:00 10 cmfn, 18mm (ribbon clip) :      DUCTAL CARCINOMA IN SITU, COMEDO, HIGH NUCLEAR GRADE FOCALLY SUSPICIOUS FOR      MICROINVASION      NECROSIS: PRESENT      CALCIFICATIONS: NOT IDENTIFIED      DCIS LENGTH: 1.1 CM      SEE NOTE       Diagnosis Note : Dr. Reed reviewed the case and concurs with the      interpretation.  A breast prognostic profile (ER and PR) is pending and will be      reported in an addendum.  [facility] was notified on 06/09/2024.  DATE SIGNED OUT: 06/09/2024 ELECTRONIC SIGNATURE : Belvie Come, John, Pathologist, Electronic Signature  MICROSCOPIC DESCRIPTION  CASE COMMENTS STAINS USED IN DIAGNOSIS: H&E-2 H&E-3 H&E-4 H&E *RECUT 1 SLIDE Stains used in diagnosis 1 ER-ACIS, 1 PR-ACIS Estrogen receptor (6F11), immunohistochemical stains are performed on formalin fixed, paraffin embedded tissue using a  3,3-diaminobenzidine (DAB) chromogen and Leica Bond Autostainer System.  The staining intensity of the nucleus is scored manually and is reported as the percentage of tumor cell nuclei demonstrating specific nuclear staining.Specimens are fixed in 10% Neutral Buffered Formalin for at least 6 hours and up to 72 hours.  These tests have not be validated on decalcified tissue.  Results should be interpreted with caution given the possibility of false negative results on decalcified specimens. PR progesterone receptor (16), immunohistochemical stains are performed on formalin fixed, paraffin embedded tissue using a 3,3-diaminobenzidine (DAB) chromogen and Leica Bond Autostainer System.  The staining intensity of the nucleus is scored manually and is reported as the percentage of tumor cell nuclei demonstrating specific nuclear staining.Specimens are fixed in 10% Neutral Buffered Formalin for at least 6 hours and up to 72 hours. These tests have not be validated on decalcified tissue.  Results should be interpreted with caution given the possibility of false negative results on decalcified specimens.  ADDENDUM 1A) Breast, right, needle core biopsy, mass, 10:00 10 cmfn, 18mm (ribbon clip) PROGNOSTIC INDICATORS  Results: IMMUNOHISTOCHEMICAL AND MORPHOMETRIC ANALYSIS PERFORMED MANUALLY Estrogen Receptor:  70%, POSITIVE, WEAK STAINING INTENSITY Progesterone Receptor:  0%, NEGATIVE  COMMENT:  The negative hormone receptor study(ies) in this case has an internal positive control.  REFERENCE RANGE ESTROGEN RECEPTOR NEGATIVE     0% POSITIVE       =>1% REFERENCE RANGE PROGESTERONE RECEPTOR NEGATIVE     0% POSITIVE        =>1% All controls stained appropriatelyer+ Picklesimer Md, Tina Carrillo , Sports administrator, International aid/development worker ( Signed 10 16 2025)   CLINICAL HISTORY  SPECIMEN(S) OBTAINED 1. Breast, right, needle core biopsy, Mass, 10:00 10 Cmfn, 18mm (ribbon Clip)  SPECIMEN COMMENTS: 1. TIF:  91844, CIT: <3 minutes SPECIMEN CLINICAL INFORMATION: 1. new right breast mass    Gross Description 1. Received in formalin, time in formalin 8:15 a.m. and CIT less than 3 minutes, are five cores of soft tan yellow tissue 0.4 to 1.5 cm in length and each 0.2 cm in diameter.  The specimen is submitted in toto.   (GRP:kh 06/08/24)        Report signed out from the following location(s) [facility] [address] [place], [place] [address] CLIA #: [id number]  [facility] [address] [place], [place] [address] CLIA #: [id number]      RADS: ADDENDUM REPORT: 06/09/2024 11:12   ADDENDUM: PATHOLOGY revealed: 1. Breast, right, needle core biopsy, mass, 10:00 10 cmfn, 18mm (ribbon clip) - DUCTAL CARCINOMA IN SITU, COMEDO, HIGH NUCLEAR GRADE FOCALLY SUSPICIOUS FOR MICROINVASION - NECROSIS: PRESENT - CALCIFICATIONS: NOT IDENTIFIED - DCIS LENGTH: 1.1 CM.   Pathology results are CONCORDANT with imaging findings, per Dr. Norleen Croak.   Pathology results and recommendations were discussed with patient via telephone on 06/09/2024 by Jacquline Cooter RN. Patient reported biopsy site doing well with no adverse symptoms, and slight tenderness at the site. Post biopsy care instructions were reviewed, questions were answered and my direct phone number was provided. Patient was instructed to call [facility] for any additional questions or concerns related to biopsy site.   RECOMMENDATIONS: 1.  Surgical and oncological consultation. Request for surgical and oncological consultation relayed to [facility] RN and Rayfield Jasmine at [facility] by Jacquline Cooter RN on 06/09/2024.   Pathology results reported by Jacquline Cooter RN on 06/09/2024.     Electronically Signed   By: Norleen Croak M.D.   On: 06/09/2024 11:12   Narrative & Impression  CLINICAL DATA:  RIGHT breast callback   EXAM: DIGITAL DIAGNOSTIC  UNILATERAL RIGHT MAMMOGRAM WITH TOMOSYNTHESIS AND CAD; ULTRASOUND RIGHT BREAST LIMITED   TECHNIQUE: Right digital diagnostic mammography and breast tomosynthesis was performed. The images were evaluated with computer-aided detection. ; Targeted ultrasound examination of the right breast was performed   COMPARISON:  Previous exam(s).   ACR Breast Density Category b: There are scattered areas of fibroglandular density.   FINDINGS: Spot compression tomosynthesis views demonstrate a persistent focal asymmetry in the RIGHT upper outer breast at posterior depth.   On physical exam, no suspicious mass is appreciated   Targeted ultrasound was performed of the RIGHT upper outer breast. At 10 o'clock 10 cm from the nipple, there is an irregular heterogeneously hypoechoic mass with indistinct margins. It measures approximately 18 x 12 x 13 mm. This corresponds to the site of mammographic concern.   Targeted ultrasound was performed of the RIGHT axilla. No suspicious axillary lymph nodes are visualized.   IMPRESSION: 1. An 18 mm mass in the RIGHT breast is indeterminate. Recommend ultrasound-guided biopsy for definitive characterization. 2. No suspicious RIGHT axillary adenopathy.   RECOMMENDATION: RIGHT breast ultrasound-guided biopsy x1   I have discussed the findings and recommendations with the patient. The biopsy procedure was discussed with the patient and questions were answered. Patient expressed their understanding of the biopsy recommendation. Patient will be scheduled for biopsy at her earliest convenience by the schedulers. Ordering provider will be notified. If applicable, a reminder letter will be sent to the patient regarding the next appointment.   BI-RADS CATEGORY  4: Suspicious.     Electronically Signed   By: Corean Salter M.D.   On: 06/07/2024 08:50      Assessment:    Ductal carcinoma in situ (DCIS) of right breast [D05.11]   Plan:    Plan 1.  Ductal carcinoma in situ (DCIS) of right breast [D05.11]  Discussed the risk of surgery including recurrence, chronic pain, post-op infxn, poor/delayed wound healing, poor cosmesis, seroma, hematoma formation, and possible re-operation to address said risks. The risks of general anesthetic, if used, includes MI, CVA, sudden death or even reaction to anesthetic medications also discussed.  Typical post-op recovery time and possbility of activity restrictions were also discussed.  Alternatives include continued observation.  Benefits include possible symptom relief, pathologic evaluation, and/or curative excision.    The patient verbalized understanding and all questions were answered to the patient's satisfaction.   2. Patient has elected to proceed with surgical treatment. Procedure will be scheduled.  RIGHT, SLNB, SCOUT tag placement prior.   Pt will need risk stratification and recommendations from her endocrinologist regarding any precautions that will be needed to undergo procedure due to 21-hydroxylase deficiency( congenital adrenal hyperplasia)     labs/images/medications/previous chart entries reviewed personally and relevant changes/updates noted above.

## 2024-06-15 NOTE — H&P (View-Only) (Signed)
 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 Change was noted on last screening mammogram.  Hx of 21-hydroxylase deficiency.   History of Present Illness       Past Medical History:  has a past medical history of Adrenal adenoma (2017), Congenital adrenal hyperplasia (CMS/HHS-HCC), MASLD, Post-menopausal, and Type 2 diabetes mellitus (CMS/HHS-HCC).   Past Surgical History:  has a past surgical history that includes vaginal reconstruction; Cleft palate repair; BILATERAL TUBES IN EARS; and Cholecystectomy.   Family History: family history includes Bipolar disorder in her mother; Colon cancer in her maternal grandmother; Colon polyps in her father; Diabetes in her maternal grandmother; High blood pressure (Hypertension) in her sister; Irregular Heart Beat (Arrhythmia) in her father; Other in her mother; Ovarian cancer in her sister.   Social History:  reports that she has quit smoking. She has never used smokeless tobacco. She reports current alcohol use. She reports that she does not use drugs.   Current Medications: has a current medication list which includes the following prescription(s): acetaminophen , alprazolam, freestyle libre 3 plus sensor, cyclobenzaprine , fludrocortisone, fluticasone  propionate, multivitamin, prednisone, simvastatin, and sitagliptin phos-metformin.   Allergies:       Allergies as of 06/14/2024 - Reviewed 04/29/2024  Allergen Reaction Noted   Tramadol  Nausea And Vomiting 12/18/2016   Morphine  Vomiting and Nausea And Vomiting 02/10/2014   Oxycodone Itching 06/01/2015   Tapentadol Nausea And Vomiting 01/08/2016      ROS:  A 15 point review of systems was performed and was negative except as noted in HPI   Objective:    Objective BP 126/83 (BP Location: Left upper arm, Patient Position: Sitting, BP Cuff Size: Adult)   Pulse (!) 115   Ht 149.9 cm (4' 11)   Wt 81.6 kg (180 lb)    BMI 36.36 kg/m    Constitutional :  No distress, cooperative, alert  Lymphatics/Throat:  Supple with no lymphadenopathy  Respiratory:  Clear to auscultation bilaterally  Cardiovascular:  Regular rate and rhythm  Gastrointestinal: Soft, non-tender, non-distended, no organomegaly.  Musculoskeletal: Steady gait and movement  Skin: Cool and moist  Psychiatric: Normal affect, non-agitated, not confused  Breast: Normal appearance and no palpable abnormality in bilateral breasts and axilla.  Chaperone present for exam.      LABS:  SURGICAL PATHOLOGY SURGICAL PATHOLOGY [facility] [address], [address] [place], [place] [address] Telephone [phone] or [phone] Fax [phone]  REPORT OF SURGICAL PATHOLOGY   Accession #: [id number] Patient Name: Tina Carrillo, Tina Carrillo Visit # : [id number]  MRN: [id number] Physician: Anice PONCE Rush DOB/Age Nov 13, 1970 (Age: 53) Gender: F Collected Date: 06/08/2024 Received Date: 06/08/2024  FINAL DIAGNOSIS       1. Breast, right, needle core biopsy, mass, 10:00 10 cmfn, 18mm (ribbon clip) :      DUCTAL CARCINOMA IN SITU, COMEDO, HIGH NUCLEAR GRADE FOCALLY SUSPICIOUS FOR      MICROINVASION      NECROSIS: PRESENT      CALCIFICATIONS: NOT IDENTIFIED      DCIS LENGTH: 1.1 CM      SEE NOTE       Diagnosis Note : Dr. Reed reviewed the case and concurs with the      interpretation.  A breast prognostic profile (ER and PR) is pending and will be      reported in an addendum.  [facility] was notified on 06/09/2024.  DATE SIGNED OUT: 06/09/2024 ELECTRONIC SIGNATURE : Belvie Come, John, Pathologist, Electronic Signature  MICROSCOPIC DESCRIPTION  CASE COMMENTS STAINS USED IN DIAGNOSIS: H&E-2 H&E-3 H&E-4 H&E *RECUT 1 SLIDE Stains used in diagnosis 1 ER-ACIS, 1 PR-ACIS Estrogen receptor (6F11), immunohistochemical stains are performed on formalin fixed, paraffin embedded tissue using a  3,3-diaminobenzidine (DAB) chromogen and Leica Bond Autostainer System.  The staining intensity of the nucleus is scored manually and is reported as the percentage of tumor cell nuclei demonstrating specific nuclear staining.Specimens are fixed in 10% Neutral Buffered Formalin for at least 6 hours and up to 72 hours.  These tests have not be validated on decalcified tissue.  Results should be interpreted with caution given the possibility of false negative results on decalcified specimens. PR progesterone receptor (16), immunohistochemical stains are performed on formalin fixed, paraffin embedded tissue using a 3,3-diaminobenzidine (DAB) chromogen and Leica Bond Autostainer System.  The staining intensity of the nucleus is scored manually and is reported as the percentage of tumor cell nuclei demonstrating specific nuclear staining.Specimens are fixed in 10% Neutral Buffered Formalin for at least 6 hours and up to 72 hours. These tests have not be validated on decalcified tissue.  Results should be interpreted with caution given the possibility of false negative results on decalcified specimens.  ADDENDUM 1A) Breast, right, needle core biopsy, mass, 10:00 10 cmfn, 18mm (ribbon clip) PROGNOSTIC INDICATORS  Results: IMMUNOHISTOCHEMICAL AND MORPHOMETRIC ANALYSIS PERFORMED MANUALLY Estrogen Receptor:  70%, POSITIVE, WEAK STAINING INTENSITY Progesterone Receptor:  0%, NEGATIVE  COMMENT:  The negative hormone receptor study(ies) in this case has an internal positive control.  REFERENCE RANGE ESTROGEN RECEPTOR NEGATIVE     0% POSITIVE       =>1% REFERENCE RANGE PROGESTERONE RECEPTOR NEGATIVE     0% POSITIVE        =>1% All controls stained appropriatelyer+ Picklesimer Md, Fred , Sports administrator, International aid/development worker ( Signed 10 16 2025)   CLINICAL HISTORY  SPECIMEN(S) OBTAINED 1. Breast, right, needle core biopsy, Mass, 10:00 10 Cmfn, 18mm (ribbon Clip)  SPECIMEN COMMENTS: 1. TIF:  91844, CIT: <3 minutes SPECIMEN CLINICAL INFORMATION: 1. new right breast mass    Gross Description 1. Received in formalin, time in formalin 8:15 a.m. and CIT less than 3 minutes, are five cores of soft tan yellow tissue 0.4 to 1.5 cm in length and each 0.2 cm in diameter.  The specimen is submitted in toto.   (GRP:kh 06/08/24)        Report signed out from the following location(s) [facility] [address] [place], [place] [address] CLIA #: [id number]  [facility] [address] [place], [place] [address] CLIA #: [id number]      RADS: ADDENDUM REPORT: 06/09/2024 11:12   ADDENDUM: PATHOLOGY revealed: 1. Breast, right, needle core biopsy, mass, 10:00 10 cmfn, 18mm (ribbon clip) - DUCTAL CARCINOMA IN SITU, COMEDO, HIGH NUCLEAR GRADE FOCALLY SUSPICIOUS FOR MICROINVASION - NECROSIS: PRESENT - CALCIFICATIONS: NOT IDENTIFIED - DCIS LENGTH: 1.1 CM.   Pathology results are CONCORDANT with imaging findings, per Dr. Norleen Croak.   Pathology results and recommendations were discussed with patient via telephone on 06/09/2024 by Jacquline Cooter RN. Patient reported biopsy site doing well with no adverse symptoms, and slight tenderness at the site. Post biopsy care instructions were reviewed, questions were answered and my direct phone number was provided. Patient was instructed to call [facility] for any additional questions or concerns related to biopsy site.   RECOMMENDATIONS: 1.  Surgical and oncological consultation. Request for surgical and oncological consultation relayed to [facility] RN and Rayfield Jasmine at [facility] by Jacquline Cooter RN on 06/09/2024.   Pathology results reported by Jacquline Cooter RN on 06/09/2024.     Electronically Signed   By: Norleen Croak M.D.   On: 06/09/2024 11:12   Narrative & Impression  CLINICAL DATA:  RIGHT breast callback   EXAM: DIGITAL DIAGNOSTIC  UNILATERAL RIGHT MAMMOGRAM WITH TOMOSYNTHESIS AND CAD; ULTRASOUND RIGHT BREAST LIMITED   TECHNIQUE: Right digital diagnostic mammography and breast tomosynthesis was performed. The images were evaluated with computer-aided detection. ; Targeted ultrasound examination of the right breast was performed   COMPARISON:  Previous exam(s).   ACR Breast Density Category b: There are scattered areas of fibroglandular density.   FINDINGS: Spot compression tomosynthesis views demonstrate a persistent focal asymmetry in the RIGHT upper outer breast at posterior depth.   On physical exam, no suspicious mass is appreciated   Targeted ultrasound was performed of the RIGHT upper outer breast. At 10 o'clock 10 cm from the nipple, there is an irregular heterogeneously hypoechoic mass with indistinct margins. It measures approximately 18 x 12 x 13 mm. This corresponds to the site of mammographic concern.   Targeted ultrasound was performed of the RIGHT axilla. No suspicious axillary lymph nodes are visualized.   IMPRESSION: 1. An 18 mm mass in the RIGHT breast is indeterminate. Recommend ultrasound-guided biopsy for definitive characterization. 2. No suspicious RIGHT axillary adenopathy.   RECOMMENDATION: RIGHT breast ultrasound-guided biopsy x1   I have discussed the findings and recommendations with the patient. The biopsy procedure was discussed with the patient and questions were answered. Patient expressed their understanding of the biopsy recommendation. Patient will be scheduled for biopsy at her earliest convenience by the schedulers. Ordering provider will be notified. If applicable, a reminder letter will be sent to the patient regarding the next appointment.   BI-RADS CATEGORY  4: Suspicious.     Electronically Signed   By: Corean Salter M.D.   On: 06/07/2024 08:50      Assessment:    Ductal carcinoma in situ (DCIS) of right breast [D05.11]   Plan:    Plan 1.  Ductal carcinoma in situ (DCIS) of right breast [D05.11]  Discussed the risk of surgery including recurrence, chronic pain, post-op infxn, poor/delayed wound healing, poor cosmesis, seroma, hematoma formation, and possible re-operation to address said risks. The risks of general anesthetic, if used, includes MI, CVA, sudden death or even reaction to anesthetic medications also discussed.  Typical post-op recovery time and possbility of activity restrictions were also discussed.  Alternatives include continued observation.  Benefits include possible symptom relief, pathologic evaluation, and/or curative excision.    The patient verbalized understanding and all questions were answered to the patient's satisfaction.   2. Patient has elected to proceed with surgical treatment. Procedure will be scheduled.  RIGHT, SLNB, SCOUT tag placement prior.   Pt will need risk stratification and recommendations from her endocrinologist regarding any precautions that will be needed to undergo procedure due to 21-hydroxylase deficiency( congenital adrenal hyperplasia)     labs/images/medications/previous chart entries reviewed personally and relevant changes/updates noted above.

## 2024-06-16 ENCOUNTER — Encounter: Payer: Self-pay | Admitting: *Deleted

## 2024-06-16 NOTE — Progress Notes (Signed)
 Lumpectomy is scheduled for 07/02/24, she will see Dr. [name] and Dr. Lenn 11/25.

## 2024-06-17 ENCOUNTER — Other Ambulatory Visit: Payer: Self-pay | Admitting: Surgery

## 2024-06-17 DIAGNOSIS — D0511 Intraductal carcinoma in situ of right breast: Secondary | ICD-10-CM

## 2024-06-21 ENCOUNTER — Other Ambulatory Visit: Payer: Self-pay | Admitting: Surgery

## 2024-06-21 DIAGNOSIS — D0511 Intraductal carcinoma in situ of right breast: Secondary | ICD-10-CM

## 2024-06-22 ENCOUNTER — Other Ambulatory Visit: Payer: Self-pay | Admitting: Surgery

## 2024-06-22 ENCOUNTER — Ambulatory Visit
Admission: RE | Admit: 2024-06-22 | Discharge: 2024-06-22 | Disposition: A | Discharge status: Home / Self Care | Source: Ambulatory Visit | Attending: Surgery | Admitting: Surgery

## 2024-06-22 ENCOUNTER — Ambulatory Visit
Admission: RE | Admit: 2024-06-22 | Discharge: 2024-06-22 | Disposition: A | Source: Ambulatory Visit | Attending: Surgery

## 2024-06-22 ENCOUNTER — Other Ambulatory Visit

## 2024-06-22 DIAGNOSIS — D0511 Intraductal carcinoma in situ of right breast: Secondary | ICD-10-CM

## 2024-06-22 DIAGNOSIS — N6311 Unspecified lump in the right breast, upper outer quadrant: Secondary | ICD-10-CM | POA: Diagnosis not present

## 2024-06-22 HISTORY — PX: BREAST BIOPSY: SHX20

## 2024-06-22 MED ORDER — LIDOCAINE HCL 1 % IJ SOLN
5.0000 mL | Freq: Once | INTRAMUSCULAR | Status: AC
Start: 2024-06-22 — End: 2024-06-22
  Administered 2024-06-22: 5 mL
  Filled 2024-06-22: qty 5

## 2024-06-23 ENCOUNTER — Encounter: Payer: Self-pay | Admitting: *Deleted

## 2024-06-23 DIAGNOSIS — D0511 Intraductal carcinoma in situ of right breast: Secondary | ICD-10-CM

## 2024-06-23 NOTE — Progress Notes (Signed)
 Received call from Ebony at [facility] surgery that pt has voiced concerns regarding financial toxicity related to medical bills. Ebony asked if pt would qualify for financial assistance. Informed that can place a referral to social worker who will contact pt to further address needs. Referral placed.

## 2024-06-24 ENCOUNTER — Inpatient Hospital Stay

## 2024-06-24 NOTE — Progress Notes (Signed)
 [facility] CSW Progress Note  Clinical Child Psychotherapist contacted patient by phone to follow-up on financial questions.    Interventions: CSW provided information regarding her medical bills coming from the different practices that she has received care from.      Follow Up Plan:  CSW will follow-up with patient by phone     Macario CHRISTELLA Au, LCSW Clinical Social Worker [facility]

## 2024-06-25 ENCOUNTER — Other Ambulatory Visit: Payer: Self-pay

## 2024-06-25 ENCOUNTER — Encounter
Admission: RE | Admit: 2024-06-25 | Discharge: 2024-06-25 | Disposition: A | Discharge status: Home / Self Care | Source: Ambulatory Visit | Attending: Surgery | Admitting: Surgery

## 2024-06-25 ENCOUNTER — Inpatient Hospital Stay

## 2024-06-25 DIAGNOSIS — Z01810 Encounter for preprocedural cardiovascular examination: Secondary | ICD-10-CM

## 2024-06-25 DIAGNOSIS — E119 Type 2 diabetes mellitus without complications: Secondary | ICD-10-CM

## 2024-06-25 DIAGNOSIS — D0511 Intraductal carcinoma in situ of right breast: Secondary | ICD-10-CM

## 2024-06-25 HISTORY — DX: Anxiety disorder, unspecified: F41.9

## 2024-06-25 HISTORY — DX: Malignant (primary) neoplasm, unspecified: C80.1

## 2024-06-25 NOTE — Patient Instructions (Addendum)
 Your procedure is scheduled on: 07/02/24 - Friday Report to the Registration Desk on the 1st floor of the [facility]. To find out your arrival time, please call [phone] between 1PM - 3PM on: 07/01/24 Thursday If your arrival time is 6:00 am, do not arrive before that time as the [facility] entrance doors do not open until 6:00 am.  REMEMBER: Instructions that are not followed completely may result in serious medical risk, up to and including death; or upon the discretion of your surgeon and anesthesiologist your surgery may need to be rescheduled.  Do not eat food after midnight the night before surgery.  No gum chewing or hard candies.  You may however, drink CLEAR liquids up to 2 hours before you are scheduled to arrive for your surgery. Do not drink anything within 2 hours of your scheduled arrival time.  Clear liquids include: - water   One week prior to surgery: Stop Anti-inflammatories (NSAIDS) such as Advil , Aleve , Ibuprofen , Motrin , Naproxen , Naprosyn  and Aspirin based products such as Excedrin, Goody's Powder, BC Powder. You may continue to take Tylenol  if needed for pain up until the day of surgery.  Stop ANY OVER THE COUNTER supplements until after surgery : FLINTSTONES COMPLETE   SitaGLIPtin-MetFORMIN - hold beginning 11/05, may resume after procedure.  ON THE DAY OF SURGERY ONLY TAKE THESE MEDICATIONS WITH SIPS OF WATER:  - Per Dr. Damian , She should double her dose of Prednisone on the day of surgery and on day 1 and 2 post-operatively. -Continue fludrocortisone as prescribed.    No Alcohol for 24 hours before or after surgery.  No Smoking including e-cigarettes for 24 hours before surgery.  No chewable tobacco products for at least 6 hours before surgery.  No nicotine patches on the day of surgery.  Do not use any recreational drugs for at least a week (preferably 2 weeks) before your surgery.  Please be advised that the combination of cocaine and  anesthesia may have negative outcomes, up to and including death. If you test positive for cocaine, your surgery will be cancelled.  On the morning of surgery brush your teeth with toothpaste and water, you may rinse your mouth with mouthwash if you wish. Do not swallow any toothpaste or mouthwash.  Use CHG Soap or wipes as directed on instruction sheet.  Do not wear jewelry, make-up, hairpins, clips or nail polish.  For welded (permanent) jewelry: bracelets, anklets, waist bands, etc.  Please have this removed prior to surgery.  If it is not removed, there is a chance that hospital personnel will need to cut it off on the day of surgery.  Do not wear lotions, powders, or perfumes.   Do not shave body hair from the neck down 48 hours before surgery.  Contact lenses, hearing aids and dentures may not be worn into surgery.  Do not bring valuables to the hospital. [facility] is not responsible for any missing/lost belongings or valuables.   Notify your doctor if there is any change in your medical condition (cold, fever, infection).  Wear comfortable clothing (specific to your surgery type) to the hospital.  After surgery, you can help prevent lung complications by doing breathing exercises.  Take deep breaths and cough every 1-2 hours. Your doctor may order a device called an Incentive Spirometer to help you take deep breaths.  When coughing or sneezing, hold a pillow firmly against your incision with both hands. This is called "splinting." Doing this helps protect your incision.  It also decreases belly discomfort.  If you are being admitted to the hospital overnight, leave your suitcase in the car. After surgery it may be brought to your room.  In case of increased patient census, it may be necessary for you, the patient, to continue your postoperative care in the Same Day Surgery department.  If you are being discharged the day of surgery, you will not be allowed to drive home. You  will need a responsible individual to drive you home and stay with you for 24 hours after surgery.   If you are taking public transportation, you will need to have a responsible individual with you.  Please call the Pre-admissions Testing Dept. at [phone] if you have any questions about these instructions.  Surgery Visitation Policy:  Patients having surgery or a procedure may have two visitors.  Children under the age of 6 must have an adult with them who is not the patient.  Inpatient Visitation:    Visiting hours are 7 a.m. to 8 p.m. Up to four visitors are allowed at one time in a patient room. The visitors may rotate out with other people during the day.  One visitor age 59 or older may stay with the patient overnight and must be in the room by 8 p.m.   Merchandiser, Retail to address health-related social needs:  [email or web address]                                                                                                            Preparing for Surgery with CHLORHEXIDINE GLUCONATE (CHG) Soap  Chlorhexidine Gluconate (CHG) Soap  o An antiseptic cleaner that kills germs and bonds with the skin to continue killing germs even after washing  o Used for showering the night before surgery and morning of surgery  Before surgery, you can play an important role by reducing the number of germs on your skin.  CHG (Chlorhexidine gluconate) soap is an antiseptic cleanser which kills germs and bonds with the skin to continue killing germs even after washing.  Please do not use if you have an allergy to CHG or antibacterial soaps. If your skin becomes reddened/irritated stop using the CHG.  1. Shower the NIGHT BEFORE SURGERY with CHG soap.  2. If you choose to wash your hair, wash your hair first as usual with your normal shampoo.  3. After shampooing, rinse your hair and body thoroughly to remove the shampoo.  4. Use CHG as you would any other  liquid soap. You can apply CHG directly to the skin and wash gently with a clean washcloth.  5. Apply the CHG soap to your body only from the neck down. Do not use on open wounds or open sores. Avoid contact with your eyes, ears, mouth, and genitals (private parts). Wash face and genitals (private parts) with your normal soap.  6. Wash thoroughly, paying special attention to the area where your surgery will be performed.  7. Thoroughly rinse your body with warm water.  8. Do not shower/wash with your  normal soap after using and rinsing off the CHG soap.  9. Do not use lotions, oils, etc., after showering with CHG.  10. Pat yourself dry with a clean towel.  11. Wear clean pajamas to bed the night before surgery.  12. Place clean sheets on your bed the night of your shower and do not sleep with pets.  13. Do not apply any deodorants/lotions/powders.  14. Please wear clean clothes to the hospital.  15. Remember to brush your teeth with your regular toothpaste.

## 2024-06-25 NOTE — Progress Notes (Signed)
 [facility] CSW Progress Note  Clinical Child Psychotherapist contacted patient by phone to follow-up on treatment costs.    Interventions: After CSW consulted Lyle Riding, Manager, informed patient's that her providers bill separately.  Patient inquired about the cost of the surgery facility.  Referred patient to Patient Financial Services at [phone].      Follow Up Plan:  Patient will contact CSW with any support or resource needs    Macario CHRISTELLA Au, LCSW Clinical Social Worker [facility]

## 2024-06-28 ENCOUNTER — Encounter
Admission: RE | Admit: 2024-06-28 | Discharge: 2024-06-28 | Disposition: A | Discharge status: Home / Self Care | Source: Ambulatory Visit | Attending: Surgery | Admitting: Surgery

## 2024-06-28 DIAGNOSIS — Z01810 Encounter for preprocedural cardiovascular examination: Secondary | ICD-10-CM | POA: Diagnosis not present

## 2024-06-28 DIAGNOSIS — Z01818 Encounter for other preprocedural examination: Secondary | ICD-10-CM | POA: Insufficient documentation

## 2024-06-28 DIAGNOSIS — R9431 Abnormal electrocardiogram [ECG] [EKG]: Secondary | ICD-10-CM | POA: Insufficient documentation

## 2024-06-28 DIAGNOSIS — E119 Type 2 diabetes mellitus without complications: Secondary | ICD-10-CM | POA: Insufficient documentation

## 2024-06-28 DIAGNOSIS — Z01812 Encounter for preprocedural laboratory examination: Secondary | ICD-10-CM | POA: Diagnosis not present

## 2024-06-28 HISTORY — DX: Intraductal carcinoma in situ of right breast: D05.11

## 2024-06-28 HISTORY — DX: Elevated white blood cell count, unspecified: D72.829

## 2024-06-28 LAB — BASIC METABOLIC PANEL WITH GFR
Anion gap: 12 (ref 5–15)
BUN: 22 mg/dL — ABNORMAL HIGH (ref 6–20)
CO2: 26 mmol/L (ref 22–32)
Calcium: 9.4 mg/dL (ref 8.9–10.3)
Chloride: 97 mmol/L — ABNORMAL LOW (ref 98–111)
Creatinine, Ser: 0.71 mg/dL (ref 0.44–1.00)
GFR, Estimated: >60 mL/min (ref >60)
Glucose, Bld: 182 mg/dL — ABNORMAL HIGH (ref 70–99)
Potassium: 4.4 mmol/L (ref 3.5–5.1)
Sodium: 135 mmol/L (ref 135–145)

## 2024-06-28 LAB — CBC
HCT: 41.4 % (ref 36.0–46.0)
Hemoglobin: 13.3 g/dL (ref 12.0–15.0)
MCH: 27 pg (ref 26.0–34.0)
MCHC: 32.1 g/dL (ref 30.0–36.0)
MCV: 84.1 fL (ref 80.0–100.0)
Platelets: 306 K/uL (ref 150–400)
RBC: 4.92 MIL/uL (ref 3.87–5.11)
RDW: 14.1 % (ref 11.5–15.5)
WBC: 18.2 K/uL — ABNORMAL HIGH (ref 4.0–10.5)
nRBC: 0 % (ref 0.0–0.2)

## 2024-07-01 MED ORDER — ORAL CARE MOUTH RINSE: 15.0000 mL | Freq: Once | OROMUCOSAL | Status: AC

## 2024-07-01 MED ORDER — CHLORHEXIDINE GLUCONATE CLOTH 2 % EX PADS: 6.0000 | MEDICATED_PAD | Freq: Once | CUTANEOUS | Status: DC

## 2024-07-01 MED ORDER — CHLORHEXIDINE GLUCONATE 0.12 % MT SOLN
15.0000 mL | Freq: Once | OROMUCOSAL | Status: AC
  Administered 2024-07-02: 15 mL via OROMUCOSAL

## 2024-07-01 MED ORDER — CEFAZOLIN SODIUM-DEXTROSE 2-4 GM/100ML-% IV SOLN
2.0000 g | INTRAVENOUS | Status: AC
  Administered 2024-07-02: 2 g via INTRAVENOUS

## 2024-07-02 ENCOUNTER — Other Ambulatory Visit: Payer: Self-pay

## 2024-07-02 ENCOUNTER — Ambulatory Visit
Admission: RE | Admit: 2024-07-02 | Discharge: 2024-07-02 | Disposition: A | Discharge status: Home / Self Care | Source: Ambulatory Visit | Attending: Surgery | Admitting: Surgery

## 2024-07-02 ENCOUNTER — Encounter: Payer: Self-pay | Admitting: Urgent Care

## 2024-07-02 ENCOUNTER — Encounter: Payer: Self-pay | Admitting: Surgery

## 2024-07-02 ENCOUNTER — Ambulatory Visit
Admission: RE | Admit: 2024-07-02 | Discharge: 2024-07-02 | Disposition: A | Discharge status: Home / Self Care | Attending: Surgery | Admitting: Surgery

## 2024-07-02 ENCOUNTER — Ambulatory Visit

## 2024-07-02 ENCOUNTER — Encounter: Admission: RE | Disposition: A | Payer: Self-pay | Source: Home / Self Care | Attending: Surgery

## 2024-07-02 DIAGNOSIS — E250 Congenital adrenogenital disorders associated with enzyme deficiency: Secondary | ICD-10-CM | POA: Diagnosis not present

## 2024-07-02 DIAGNOSIS — K219 Gastro-esophageal reflux disease without esophagitis: Secondary | ICD-10-CM | POA: Insufficient documentation

## 2024-07-02 DIAGNOSIS — D0511 Intraductal carcinoma in situ of right breast: Secondary | ICD-10-CM | POA: Diagnosis not present

## 2024-07-02 DIAGNOSIS — Z833 Family history of diabetes mellitus: Secondary | ICD-10-CM | POA: Insufficient documentation

## 2024-07-02 DIAGNOSIS — G4730 Sleep apnea, unspecified: Secondary | ICD-10-CM | POA: Diagnosis not present

## 2024-07-02 DIAGNOSIS — Z7984 Long term (current) use of oral hypoglycemic drugs: Secondary | ICD-10-CM | POA: Diagnosis not present

## 2024-07-02 DIAGNOSIS — Z87891 Personal history of nicotine dependence: Secondary | ICD-10-CM | POA: Diagnosis not present

## 2024-07-02 DIAGNOSIS — E119 Type 2 diabetes mellitus without complications: Secondary | ICD-10-CM | POA: Insufficient documentation

## 2024-07-02 HISTORY — PX: BREAST LUMPECTOMY WITH RADIO FREQUENCY LOCALIZER: SHX6897

## 2024-07-02 HISTORY — PX: AXILLARY SENTINEL NODE BIOPSY: SHX5738

## 2024-07-02 LAB — GLUCOSE, CAPILLARY
Glucose-Capillary: 162 mg/dL — ABNORMAL HIGH (ref 70–99)
Glucose-Capillary: 246 mg/dL — ABNORMAL HIGH (ref 70–99)
Glucose-Capillary: 231 mg/dL — ABNORMAL HIGH (ref 70–99)

## 2024-07-02 SURGERY — BREAST LUMPECTOMY WITH RADIO FREQUENCY LOCALIZER
Anesthesia: General | Laterality: Right

## 2024-07-02 MED ORDER — FENTANYL CITRATE (PF) 100 MCG/2ML IJ SOLN
INTRAMUSCULAR | Status: DC | PRN
  Administered 2024-07-02 (×4): 50 ug via INTRAVENOUS

## 2024-07-02 MED ORDER — ONDANSETRON HCL 4 MG/2ML IJ SOLN
INTRAMUSCULAR | Status: AC
  Filled 2024-07-02: qty 2

## 2024-07-02 MED ORDER — ONDANSETRON HCL 4 MG PO TABS
4.0000 mg | ORAL_TABLET | Freq: Three times a day (TID) | ORAL | 0 refills | Status: DC | PRN
  Filled 2024-07-02: qty 18, 6d supply, fill #0

## 2024-07-02 MED ORDER — ACETAMINOPHEN 10 MG/ML IV SOLN
INTRAVENOUS | Status: AC
  Filled 2024-07-02: qty 100

## 2024-07-02 MED ORDER — LACTATED RINGERS IV SOLN
INTRAVENOUS | Status: DC | PRN
Start: 2024-07-02 — End: 2024-07-02

## 2024-07-02 MED ORDER — PROPOFOL 1000 MG/100ML IV EMUL
INTRAVENOUS | Status: AC
  Filled 2024-07-02: qty 100

## 2024-07-02 MED ORDER — DIPHENHYDRAMINE HCL 50 MG/ML IJ SOLN
INTRAMUSCULAR | Status: AC
  Filled 2024-07-02: qty 1

## 2024-07-02 MED ORDER — FENTANYL CITRATE (PF) 100 MCG/2ML IJ SOLN: 25.0000 ug | INTRAMUSCULAR | Status: DC | PRN

## 2024-07-02 MED ORDER — PHENYLEPHRINE 80 MCG/ML (10ML) SYRINGE FOR IV PUSH (FOR BLOOD PRESSURE SUPPORT)
PREFILLED_SYRINGE | INTRAVENOUS | Status: DC | PRN
  Administered 2024-07-02: 80 ug via INTRAVENOUS

## 2024-07-02 MED ORDER — INSULIN ASPART 100 UNIT/ML IJ SOLN
8.0000 [IU] | Freq: Once | INTRAMUSCULAR | Status: AC
  Administered 2024-07-02: 8 [IU] via SUBCUTANEOUS

## 2024-07-02 MED ORDER — OXYCODONE HCL 5 MG PO TABS: 5.0000 mg | ORAL_TABLET | Freq: Once | ORAL | Status: DC | PRN

## 2024-07-02 MED ORDER — ONDANSETRON HCL 4 MG/2ML IJ SOLN
INTRAMUSCULAR | Status: DC | PRN
  Administered 2024-07-02: 4 mg via INTRAVENOUS

## 2024-07-02 MED ORDER — MIDAZOLAM HCL 2 MG/2ML IJ SOLN
INTRAMUSCULAR | Status: AC
  Filled 2024-07-02: qty 2

## 2024-07-02 MED ORDER — BUPIVACAINE-EPINEPHRINE (PF) 0.5% -1:200000 IJ SOLN
INTRAMUSCULAR | Status: AC
  Filled 2024-07-02: qty 30

## 2024-07-02 MED ORDER — PROPOFOL 10 MG/ML IV BOLUS
INTRAVENOUS | Status: DC | PRN
  Administered 2024-07-02: 200 mg via INTRAVENOUS

## 2024-07-02 MED ORDER — INSULIN ASPART 100 UNIT/ML IJ SOLN
INTRAMUSCULAR | Status: AC
  Filled 2024-07-02: qty 1

## 2024-07-02 MED ORDER — IBUPROFEN 200 MG PO TABS
400.0000 mg | ORAL_TABLET | Freq: Three times a day (TID) | ORAL | 0 refills | Status: DC | PRN
  Filled 2024-07-02: qty 30, 5d supply, fill #0

## 2024-07-02 MED ORDER — CHLORHEXIDINE GLUCONATE 0.12 % MT SOLN
OROMUCOSAL | Status: AC
  Filled 2024-07-02: qty 15

## 2024-07-02 MED ORDER — FENTANYL CITRATE (PF) 100 MCG/2ML IJ SOLN
INTRAMUSCULAR | Status: AC
  Filled 2024-07-02: qty 2

## 2024-07-02 MED ORDER — LIDOCAINE HCL (CARDIAC) PF 100 MG/5ML IV SOSY
PREFILLED_SYRINGE | INTRAVENOUS | Status: DC | PRN
  Administered 2024-07-02: 80 mg via INTRAVENOUS
  Administered 2024-07-02: 20 mg via INTRAVENOUS

## 2024-07-02 MED ORDER — DEXAMETHASONE SOD PHOSPHATE PF 10 MG/ML IJ SOLN
INTRAMUSCULAR | Status: DC | PRN
  Administered 2024-07-02: 10 mg via INTRAVENOUS

## 2024-07-02 MED ORDER — MIDAZOLAM HCL (PF) 2 MG/2ML IJ SOLN
INTRAMUSCULAR | Status: DC | PRN
  Administered 2024-07-02: 2 mg via INTRAVENOUS

## 2024-07-02 MED ORDER — ACETAMINOPHEN 10 MG/ML IV SOLN
INTRAVENOUS | Status: DC | PRN
  Administered 2024-07-02: 1000 mg via INTRAVENOUS

## 2024-07-02 MED ORDER — SODIUM CHLORIDE 0.9 % IV SOLN: INTRAVENOUS | Status: DC

## 2024-07-02 MED ORDER — LIDOCAINE HCL (PF) 2 % IJ SOLN
INTRAMUSCULAR | Status: AC
  Filled 2024-07-02: qty 5

## 2024-07-02 MED ORDER — PROPOFOL 10 MG/ML IV BOLUS
INTRAVENOUS | Status: AC
  Filled 2024-07-02: qty 20

## 2024-07-02 MED ORDER — BUPIVACAINE-EPINEPHRINE 0.5% -1:200000 IJ SOLN
INTRAMUSCULAR | Status: DC | PRN
Start: 2024-07-02 — End: 2024-07-02
  Administered 2024-07-02: 10 mL

## 2024-07-02 MED ORDER — CEFAZOLIN SODIUM-DEXTROSE 2-4 GM/100ML-% IV SOLN
INTRAVENOUS | Status: AC
  Filled 2024-07-02: qty 100

## 2024-07-02 MED ORDER — OXYCODONE HCL 5 MG/5ML PO SOLN: 5.0000 mg | Freq: Once | ORAL | Status: DC | PRN

## 2024-07-02 MED ORDER — STERILE WATER FOR IRRIGATION IR SOLN
Status: DC | PRN
  Administered 2024-07-02: 500 mL

## 2024-07-02 MED ORDER — PROPOFOL 500 MG/50ML IV EMUL
INTRAVENOUS | Status: DC | PRN
  Administered 2024-07-02: 150 ug/kg/min via INTRAVENOUS

## 2024-07-02 MED ORDER — CHLORHEXIDINE GLUCONATE 0.12 % MT SOLN
OROMUCOSAL | Status: AC
Start: 2024-07-02 — End: 2024-07-02
  Filled 2024-07-02: qty 15

## 2024-07-02 MED ORDER — PHENYLEPHRINE 80 MCG/ML (10ML) SYRINGE FOR IV PUSH (FOR BLOOD PRESSURE SUPPORT)
PREFILLED_SYRINGE | INTRAVENOUS | Status: AC
  Filled 2024-07-02: qty 10

## 2024-07-02 MED ORDER — TECHNETIUM TC 99M TILMANOCEPT KIT
1.1000 | PACK | Freq: Once | INTRAVENOUS | Status: AC | PRN
  Administered 2024-07-02: 1.1 via INTRADERMAL

## 2024-07-02 MED ORDER — DIPHENHYDRAMINE HCL 50 MG/ML IJ SOLN
INTRAMUSCULAR | Status: DC | PRN
Start: 2024-07-02 — End: 2024-07-02
  Administered 2024-07-02: 25 mg via INTRAVENOUS

## 2024-07-02 SURGICAL SUPPLY — 34 items
BLADE PHOTON 3 ILLUMINATED (MISCELLANEOUS) ×1 IMPLANT
BLADE SURG 15 STRL LF DISP TIS (BLADE) ×1 IMPLANT
CHLORAPREP W/TINT 26 (MISCELLANEOUS) IMPLANT
COVER PROBE GAMMA FINDER SLV (MISCELLANEOUS) ×1 IMPLANT
DERMABOND ADVANCED .7 DNX12 (GAUZE/BANDAGES/DRESSINGS) ×1 IMPLANT
DEVICE DUBIN SPECIMEN MAMMOGRA (MISCELLANEOUS) ×1 IMPLANT
DRAPE LAPAROTOMY TRNSV 106X77 (MISCELLANEOUS) ×1 IMPLANT
DRAPE SHEET LG 3/4 BI-LAMINATE (DRAPES) IMPLANT
DRSG TELFA 4X8 ISLAND PHMB (GAUZE/BANDAGES/DRESSINGS) IMPLANT
ELECTRODE REM PT RTRN 9FT ADLT (ELECTROSURGICAL) ×1 IMPLANT
GAUZE 4X4 16PLY ~~LOC~~+RFID DBL (SPONGE) IMPLANT
GLOVE BIOGEL PI IND STRL 7.0 (GLOVE) ×1 IMPLANT
GLOVE SURG SYN 6.5 PF PI (GLOVE) ×3 IMPLANT
GOWN STRL REUS W/ TWL LRG LVL3 (GOWN DISPOSABLE) ×3 IMPLANT
KIT MARKER MARGIN INK (KITS) ×1 IMPLANT
KIT TURNOVER KIT A (KITS) ×1 IMPLANT
LABEL OR SOLS (LABEL) ×1 IMPLANT
LIGHT WAVEGUIDE WIDE FLAT (MISCELLANEOUS) IMPLANT
MANIFOLD NEPTUNE II (INSTRUMENTS) ×1 IMPLANT
MARKER MARGIN CORRECT CLIP (MARKER) ×1 IMPLANT
NDL HYPO 22X1.5 SAFETY MO (MISCELLANEOUS) ×2 IMPLANT
NEEDLE HYPO 22X1.5 SAFETY MO (MISCELLANEOUS) ×2 IMPLANT
PACK BASIN MINOR ARMC (MISCELLANEOUS) ×1 IMPLANT
SHEATH BREAST BIOPSY SKIN MKR (SHEATH) ×1 IMPLANT
SOLN STERILE WATER BTL 1000 ML (IV SOLUTION) ×1 IMPLANT
SUT SILK 2-0 30XBRD TIE 12 (SUTURE) IMPLANT
SUT SILK 3 0 12 30 (SUTURE) IMPLANT
SUT VIC AB 3-0 SH 27X BRD (SUTURE) ×1 IMPLANT
SUTURE EHLN 3-0 FS-10 30 BLK (SUTURE) IMPLANT
SUTURE MNCRL 4-0 27XMF (SUTURE) ×1 IMPLANT
SYR 20ML LL LF (SYRINGE) ×1 IMPLANT
TRAP FLUID SMOKE EVACUATOR (MISCELLANEOUS) ×1 IMPLANT
TRAP NEPTUNE SPECIMEN COLLECT (MISCELLANEOUS) ×1 IMPLANT
WATER STERILE IRR 500ML POUR (IV SOLUTION) ×1 IMPLANT

## 2024-07-02 NOTE — Op Note (Signed)
 Preoperative diagnosis: DCIS right breast   Postoperative diagnosis: Same.   Procedure: SCOUT tag-localized right breast partial mastectomy.                      Right axillary Sentinel Lymph node biopsy  Anesthesia: GETA  Surgeon: Dr. Henriette Pierre  Wound Classification: Clean  Indications: Patient is a 53 y.o. female with a nonpalpable right breast mass noted on mammography with core biopsy demonstrating DCIS requires SCOUT localizer placement, partial mastectomy for treatment with sentinel lymph node biopsy.   Specimen: Right breast mass, Sentinel Lymph nodes x 1, superior margin  Complications: None  Estimated Blood Loss: 30 mL  Findings: 1. Specimen mammography shows marker and SCOUT localizer on specimen 2. Pathology call refers gross examination of margins was positive in the superior aspect, superior margin resected. 3. No other palpable mass or lymph node identified.   Operation performed with curative intent:Yes  Tracer(s) used to identify sentinel nodes in the upfront surgery (non-neoadjuvant) setting (select all that apply):Radioactive Tracer  Tracer(s) used to identify sentinel nodes in the neoadjuvant setting (select all that apply):N/A  All nodes (colored or non-colored) present at the end of a dye-filled lymphatic channel were removed:N/A  All significantly radioactive nodes were removed:Yes  All palpable suspicious nodes were removed:N/A  Biopsy-proven positive nodes marked with clips prior to chemotherapy were identified and removed:N/A    Description of procedure: SCOUT localization was performed by radiology prior to procedure. In the nuclear medicine suite, the subareolar region was injected with Tc-99 sulfur colloid the morning of procedure. Localization studies were reviewed. The patient was taken to the operating room and placed supine on the operating table, and after general anesthesia the right breast and axilla were prepped and draped in the usual  sterile fashion. A time-out was completed verifying correct patient, procedure, site, positioning, and implant(s) and/or special equipment prior to beginning this procedure.  By identifying the SCOUT localizer, the probable trajectory and location of the mass was visualized. A skin incision was planned in such a way as to minimize the amount of dissection to reach the mass.  The skin incision was made after infusion of local. Flaps were raised and  Sharp and blunt dissection was then taken down to the mass, taking care to include the entire SCOUT localizer and a margin of grossly normal tissue. The specimen was removed.  Inspection of the specimen noted scout tag not localized within.  Reinspection of the excision area noted the skull to actually be slightly more inferior to the original resection.  The original specimen was marked as superior margin and the scout and surrounding tissue and the more inferior aspect was removed.    Both specimens oriented with paint. Imaging reviewed and the entire target lesion had been resected, with biopsy clip and localizer within the specimen.Gross margin analysis by pathology confirmed superior portion positive as expected.  Superior margin was sent to pathology as well, and confirm that the superior margin specimen did not have any areas of concern.  A hand-held gamma probe was used to identify the location of the hottest spot in the axilla.  This was identified within the original breast incision so additional dissection was carried towards the axilla from within the original incision.    Sharp and blunt Dissection was carried down to subdermal facias. The probe was placed within wound and again, the point of maximal count was found. Dissection continue until nodule was identified. The probe was placed in  contact with the node and 550 counts were recorded. The node was excised in its entirety. Ex vivo, the node measured 565 counts when placed on the probe. The bed of  the node measured less than 50 counts.  No additional hot spots were identified. No clinically abnormal nodes were palpated.  Wound hemostasis was achieved and the wound closed with interrupted 3-0 nylon due to the extremely thin dermal layer.  Wound then dressed with island dressing.  The patient tolerated the procedure well and was taken to the postanesthesia care unit in stable condition. Sponge and instrument count correct at end of procedure.

## 2024-07-02 NOTE — Discharge Instructions (Signed)
 Removal, Care After This sheet gives you information about how to care for yourself after your procedure. Your health care provider may also give you more specific instructions. If you have problems or questions, contact your health care provider. What can I expect after the procedure? After the procedure, it is common to have: Soreness. Bruising. Itching. Follow these instructions at home: site care Follow instructions from your health care provider about how to take care of your site. Make sure you: Wash your hands with soap and water before and after you change your bandage (dressing). If soap and water are not available, use hand sanitizer. Leave stitches (sutures), skin glue, or adhesive strips in place. These skin closures may need to stay in place for 2 weeks or longer. If adhesive strip edges start to loosen and curl up, you may trim the loose edges. Do not remove adhesive strips completely unless your health care provider tells you to do that. If the area bleeds or bruises, apply gentle pressure for 10 minutes. OK TO SHOWER IN 24HRS with dressing intact, or wait until 48 hours at which time he can remove the outer dressing and shower.  Please keep the sutures covered with a large Band-Aid that is changed daily until seen in office  Check your site every day for signs of infection. Check for: Redness, swelling, or pain. Fluid or blood. Warmth. Pus or a bad smell.  General instructions Rest and then return to your normal activities as told by your health care provider.  tylenol  and advil  as needed for discomfort.  Please alternate between the two every four hours as needed for pain.    Use narcotics, if prescribed, only when tylenol  and motrin  is not enough to control pain.  325-650mg  every 8hrs to max of 3000mg /24hrs (including the 325mg  in every norco dose) for the tylenol .    Advil  up to 800mg  per dose every 8hrs as needed for pain.   Keep all follow-up visits as told by your  health care provider. This is important. Contact a health care provider if: You have redness, swelling, or pain around your site. You have fluid or blood coming from your site. Your site feels warm to the touch. You have pus or a bad smell coming from your site. You have a fever. Your sutures, skin glue, or adhesive strips loosen or come off sooner than expected. Get help right away if: You have bleeding that does not stop with pressure or a dressing. Summary After the procedure, it is common to have some soreness, bruising, and itching at the site. Follow instructions from your health care provider about how to take care of your site. Check your site every day for signs of infection. Contact a health care provider if you have redness, swelling, or pain around your site, or your site feels warm to the touch. Keep all follow-up visits as told by your health care provider. This is important. This information is not intended to replace advice given to you by your health care provider. Make sure you discuss any questions you have with your health care provider. Document Released: 09/08/2015 Document Revised: 02/09/2018 Document Reviewed: 02/09/2018 Elsevier Interactive Patient Education  [employer].

## 2024-07-02 NOTE — Anesthesia Postprocedure Evaluation (Signed)
 Anesthesia Post Note  Patient: KEILAH LEMIRE  Procedure(s) Performed: BREAST LUMPECTOMY WITH RADIO FREQUENCY LOCALIZER (Right) BIOPSY, LYMPH NODE, SENTINEL, AXILLARY (Right)  Patient location during evaluation: PACU Anesthesia Type: General Level of consciousness: awake and alert Pain management: pain level controlled Vital Signs Assessment: post-procedure vital signs reviewed and stable Respiratory status: spontaneous breathing, nonlabored ventilation, respiratory function stable and patient connected to nasal cannula oxygen Cardiovascular status: blood pressure returned to baseline and stable Postop Assessment: no apparent nausea or vomiting Anesthetic complications: no   No notable events documented.   Last Vitals:  Vitals:   07/02/24 0950 07/02/24 1200  BP: 134/89 126/81  Pulse: 98 91  Resp: 16 17  Temp: 36.6 C (!) 36.1 C  SpO2: 98% 100%    Last Pain:  Vitals:   07/02/24 1200  TempSrc:   PainSc: 0-No pain                 Debby Mines

## 2024-07-02 NOTE — Anesthesia Preprocedure Evaluation (Addendum)
 Anesthesia Evaluation  Patient identified by MRN, date of birth, ID band Patient awake    Reviewed: Allergy & Precautions, H&P , NPO status , Patient's Chart, lab work & pertinent test results  History of Anesthesia Complications (+) PONV and history of anesthetic complications  Airway Mallampati: III  TM Distance: <3 FB Neck ROM: Full    Dental no notable dental hx.    Pulmonary sleep apnea , former smoker   Pulmonary exam normal breath sounds clear to auscultation       Cardiovascular negative cardio ROS Normal cardiovascular exam+ dysrhythmias  Rhythm:Regular Rate:Normal     Neuro/Psych  PSYCHIATRIC DISORDERS      negative neurological ROS  negative psych ROS   GI/Hepatic Neg liver ROS,GERD  Medicated,,  Endo/Other  diabetes    Renal/GU      Musculoskeletal   Abdominal   Peds  Hematology negative hematology ROS (+)   Anesthesia Other Findings Poorly controlled DM Congenital drenal hyperplasia  Adrenal benign tumor PONV (postoperative nausea and vomiting)  GERD Hx repair congenital cleft palate Sleep apnea  Reproductive/Obstetrics negative OB ROS                              Anesthesia Physical Anesthesia Plan  ASA: 2  Anesthesia Plan:    Post-op Pain Management:    Induction: Intravenous  PONV Risk Score and Plan: 4 or greater and Propofol  infusion, TIVA, Dexamethasone  and Ondansetron   Airway Management Planned: LMA  Additional Equipment:   Intra-op Plan:   Post-operative Plan: Extubation in OR  Informed Consent: I have reviewed the patients History and Physical, chart, labs and discussed the procedure including the risks, benefits and alternatives for the proposed anesthesia with the patient or authorized representative who has indicated his/her understanding and acceptance.     Dental Advisory Given  Plan Discussed with: Anesthesiologist, CRNA and  Surgeon  Anesthesia Plan Comments: (Patient consented for risks of anesthesia including but not limited to:  - adverse reactions to medications - damage to eyes, teeth, lips or other oral mucosa - nerve damage due to positioning  - sore throat or hoarseness - Damage to heart, brain, nerves, lungs, other parts of body or loss of life  Patient voiced understanding and assent.)         Anesthesia Quick Evaluation

## 2024-07-02 NOTE — Transfer of Care (Signed)
 Immediate Anesthesia Transfer of Care Note  Patient: Tina Carrillo  Procedure(s) Performed: BREAST LUMPECTOMY WITH RADIO FREQUENCY LOCALIZER (Right) BIOPSY, LYMPH NODE, SENTINEL, AXILLARY (Right)  Patient Location: PACU  Anesthesia Type:General  Level of Consciousness: awake and drowsy  Airway & Oxygen Therapy: Patient Spontanous Breathing and Patient connected to face mask oxygen  Post-op Assessment: Report given to RN and Post -op Vital signs reviewed and stable  Post vital signs: Reviewed and stable  Last Vitals:  Vitals Value Taken Time  BP 141/76 07/02/24 11:50  Temp    Pulse 95 07/02/24 11:53  Resp 22 07/02/24 11:53  SpO2 100 % 07/02/24 11:53  Vitals shown include unfiled device data.  Last Pain:  Vitals:   07/02/24 0950  TempSrc: Temporal  PainSc: 0-No pain         Complications: No notable events documented.

## 2024-07-02 NOTE — Interval H&P Note (Signed)
 History and Physical Interval Note:  07/02/2024 9:51 AM  Tina Carrillo  has presented today for surgery, with the diagnosis of Ductal carcinoma in situ of right breast D05.11.  The various methods of treatment have been discussed with the patient and family. After consideration of risks, benefits and other options for treatment, the patient has consented to  Procedure(s): BREAST LUMPECTOMY WITH RADIO FREQUENCY LOCALIZER (Right) BIOPSY, LYMPH NODE, SENTINEL, AXILLARY (Right) as a surgical intervention.  The patient's history has been reviewed, patient examined, no change in status, stable for surgery.  I have reviewed the patient's chart and labs.  Questions were answered to the patient's satisfaction.     Hilliary Jock Tye

## 2024-07-03 ENCOUNTER — Encounter: Payer: Self-pay | Admitting: Surgery

## 2024-07-03 ENCOUNTER — Other Ambulatory Visit: Payer: Self-pay | Admitting: Surgery

## 2024-07-03 DIAGNOSIS — D0511 Intraductal carcinoma in situ of right breast: Secondary | ICD-10-CM

## 2024-07-03 MED ORDER — HYDROCODONE-ACETAMINOPHEN 5-325 MG PO TABS: 1.0000 | ORAL_TABLET | Freq: Three times a day (TID) | ORAL | 0 refills | Status: AC | PRN

## 2024-07-06 ENCOUNTER — Other Ambulatory Visit: Payer: Self-pay | Admitting: Pathology

## 2024-07-06 LAB — SURGICAL PATHOLOGY

## 2024-07-14 ENCOUNTER — Inpatient Hospital Stay: Attending: Oncology | Admitting: Hospice and Palliative Medicine

## 2024-07-14 DIAGNOSIS — I2510 Atherosclerotic heart disease of native coronary artery without angina pectoris: Secondary | ICD-10-CM | POA: Insufficient documentation

## 2024-07-14 DIAGNOSIS — Z79899 Other long term (current) drug therapy: Secondary | ICD-10-CM | POA: Insufficient documentation

## 2024-07-14 DIAGNOSIS — Z818 Family history of other mental and behavioral disorders: Secondary | ICD-10-CM | POA: Insufficient documentation

## 2024-07-14 DIAGNOSIS — Z833 Family history of diabetes mellitus: Secondary | ICD-10-CM | POA: Insufficient documentation

## 2024-07-14 DIAGNOSIS — C50411 Malignant neoplasm of upper-outer quadrant of right female breast: Secondary | ICD-10-CM | POA: Insufficient documentation

## 2024-07-14 DIAGNOSIS — Z17421 Hormone receptor negative with human epidermal growth factor receptor 2 negative status: Secondary | ICD-10-CM | POA: Insufficient documentation

## 2024-07-14 DIAGNOSIS — Z1722 Progesterone receptor negative status: Secondary | ICD-10-CM | POA: Insufficient documentation

## 2024-07-14 DIAGNOSIS — Z171 Estrogen receptor negative status [ER-]: Secondary | ICD-10-CM | POA: Insufficient documentation

## 2024-07-14 DIAGNOSIS — E250 Congenital adrenogenital disorders associated with enzyme deficiency: Secondary | ICD-10-CM | POA: Insufficient documentation

## 2024-07-14 DIAGNOSIS — Z885 Allergy status to narcotic agent status: Secondary | ICD-10-CM | POA: Insufficient documentation

## 2024-07-14 DIAGNOSIS — Z87891 Personal history of nicotine dependence: Secondary | ICD-10-CM | POA: Insufficient documentation

## 2024-07-14 DIAGNOSIS — Z87730 Personal history of (corrected) cleft lip and palate: Secondary | ICD-10-CM | POA: Insufficient documentation

## 2024-07-14 DIAGNOSIS — Z8249 Family history of ischemic heart disease and other diseases of the circulatory system: Secondary | ICD-10-CM | POA: Insufficient documentation

## 2024-07-14 DIAGNOSIS — Z803 Family history of malignant neoplasm of breast: Secondary | ICD-10-CM | POA: Insufficient documentation

## 2024-07-14 DIAGNOSIS — D0511 Intraductal carcinoma in situ of right breast: Secondary | ICD-10-CM

## 2024-07-14 DIAGNOSIS — Z9049 Acquired absence of other specified parts of digestive tract: Secondary | ICD-10-CM | POA: Insufficient documentation

## 2024-07-14 DIAGNOSIS — E119 Type 2 diabetes mellitus without complications: Secondary | ICD-10-CM | POA: Insufficient documentation

## 2024-07-14 DIAGNOSIS — Z8041 Family history of malignant neoplasm of ovary: Secondary | ICD-10-CM | POA: Insufficient documentation

## 2024-07-14 DIAGNOSIS — Z8049 Family history of malignant neoplasm of other genital organs: Secondary | ICD-10-CM | POA: Insufficient documentation

## 2024-07-14 DIAGNOSIS — Z808 Family history of malignant neoplasm of other organs or systems: Secondary | ICD-10-CM | POA: Insufficient documentation

## 2024-07-14 NOTE — Progress Notes (Signed)
 Multidisciplinary Oncology Council Documentation  Tina Carrillo was presented by our [facility] on 07/14/2024, which included representatives from:  Palliative Care Dietitian  Physical/Occupational Therapist Nurse Navigator Genetics Social work Survivorship RN Financial Navigator Research RN   Tina Carrillo currently presents with history of breast cancer  We reviewed previous medical and familial history, history of present illness, and recent lab results along with all available histopathologic and imaging studies. The MOC considered available treatment options and made the following recommendations/referrals:  Rehab screening  The MOC is a meeting of clinicians from various specialty areas who evaluate and discuss patients for whom a multidisciplinary approach is being considered. Final determinations in the plan of care are those of the provider(s).   Today's extended care, comprehensive team conference, Tina Carrillo was not present for the discussion and was not examined.

## 2024-07-19 ENCOUNTER — Inpatient Hospital Stay

## 2024-07-19 DIAGNOSIS — E1169 Type 2 diabetes mellitus with other specified complication: Secondary | ICD-10-CM | POA: Diagnosis not present

## 2024-07-19 DIAGNOSIS — E782 Mixed hyperlipidemia: Secondary | ICD-10-CM | POA: Diagnosis not present

## 2024-07-20 ENCOUNTER — Ambulatory Visit
Admission: RE | Admit: 2024-07-20 | Discharge: 2024-07-20 | Disposition: A | Discharge status: Home / Self Care | Source: Ambulatory Visit | Attending: Radiation Oncology | Admitting: Radiation Oncology

## 2024-07-20 ENCOUNTER — Inpatient Hospital Stay: Admitting: Oncology

## 2024-07-20 ENCOUNTER — Encounter: Payer: Self-pay | Admitting: Radiation Oncology

## 2024-07-20 ENCOUNTER — Other Ambulatory Visit: Payer: Self-pay | Admitting: *Deleted

## 2024-07-20 VITALS — BP 139/86 | HR 108 | Temp 98.2°F | Resp 12

## 2024-07-20 DIAGNOSIS — Z803 Family history of malignant neoplasm of breast: Secondary | ICD-10-CM | POA: Insufficient documentation

## 2024-07-20 DIAGNOSIS — E119 Type 2 diabetes mellitus without complications: Secondary | ICD-10-CM | POA: Diagnosis not present

## 2024-07-20 DIAGNOSIS — Z7984 Long term (current) use of oral hypoglycemic drugs: Secondary | ICD-10-CM | POA: Diagnosis not present

## 2024-07-20 DIAGNOSIS — D0511 Intraductal carcinoma in situ of right breast: Secondary | ICD-10-CM | POA: Diagnosis not present

## 2024-07-20 DIAGNOSIS — D369 Benign neoplasm, unspecified site: Secondary | ICD-10-CM | POA: Diagnosis not present

## 2024-07-20 DIAGNOSIS — Z170 Estrogen receptor positive status [ER+]: Secondary | ICD-10-CM | POA: Diagnosis not present

## 2024-07-20 DIAGNOSIS — K219 Gastro-esophageal reflux disease without esophagitis: Secondary | ICD-10-CM | POA: Insufficient documentation

## 2024-07-20 DIAGNOSIS — Z808 Family history of malignant neoplasm of other organs or systems: Secondary | ICD-10-CM | POA: Insufficient documentation

## 2024-07-20 DIAGNOSIS — Z1721 Progesterone receptor positive status: Secondary | ICD-10-CM | POA: Diagnosis not present

## 2024-07-20 DIAGNOSIS — Z171 Estrogen receptor negative status [ER-]: Secondary | ICD-10-CM | POA: Diagnosis not present

## 2024-07-20 DIAGNOSIS — Z8041 Family history of malignant neoplasm of ovary: Secondary | ICD-10-CM | POA: Diagnosis not present

## 2024-07-20 DIAGNOSIS — Z87891 Personal history of nicotine dependence: Secondary | ICD-10-CM | POA: Insufficient documentation

## 2024-07-20 DIAGNOSIS — Z79899 Other long term (current) drug therapy: Secondary | ICD-10-CM | POA: Insufficient documentation

## 2024-07-20 DIAGNOSIS — Z1732 Human epidermal growth factor receptor 2 negative status: Secondary | ICD-10-CM | POA: Diagnosis not present

## 2024-07-20 NOTE — Consult Note (Signed)
 NEW PATIENT EVALUATION  Name: Tina Carrillo  MRN: [id number]  Date:   07/20/2024     DOB: [birth year]   This 53 y.o. female patient presents to the clinic for initial evaluation of pathology stage Ia (pT1b N0 M0) triple negative invasive mammary carcinoma of the right breast status post wide local excision and sentinel node biopsy.  REFERRING PHYSICIAN: Fields, Gaetana CROME, NP  CHIEF COMPLAINT: No chief complaint on file.   DIAGNOSIS: The encounter diagnosis was Ductal carcinoma in situ (DCIS) of right breast.   PREVIOUS INVESTIGATIONS:  Mammogram ultrasound reviewed Clinical notes reviewed Pathology reports reviewed  HPI: Patient is a 53 year old female who presented with an abnormal mammogram and screening of her right breast.  There was an 18 mm mass in the right breast which was indeterminate.  No suspicious right axillary adenopathy was noted.  She underwent a ultrasound-guided biopsy which was positive for ductal carcinoma in situ comedo high nuclear grade focally suspicious for microinvasion.  She went on to have a wide local excision showing a 8 mm area of grade 3 invasive mammary carcinoma high-grade with comedonecrosis.  Lymphatic and vascular invasion was present.  Margins were clear at 3 mm for the invasive component 5 mm for the DCIS component.  1 sentinel lymph node was examined and negative for metastatic disease.  Tumor was triple negative.  She has had some draining from her incision which seems to be slowing down.  She otherwise specifically denies breast tenderness cough or bone pain.  Patient does have a past medical history significant for adrenal benign tumor adrenal hyperplasia anxiety diabetes mellitus without complication.  She has a history of repair of a congenital cleft palate.  She is seen today for rating recommendations regarding radiation for her right breast.  PLANNED TREATMENT REGIMEN: Hypofractionated right whole breast radiation  PAST MEDICAL HISTORY:  has  a past medical history of Adrenal benign tumor, Adrenal hyperplasia, Anxiety, Diabetes mellitus without complication (HCC), Ductal carcinoma in situ (DCIS) of right breast, GERD (gastroesophageal reflux disease), History of repair of congenital cleft palate, Leukocytosis, PONV (postoperative nausea and vomiting), and Sleep apnea.    PAST SURGICAL HISTORY:  Past Surgical History:  Procedure Laterality Date   AXILLARY SENTINEL NODE BIOPSY Right 07/02/2024   Procedure: BIOPSY, LYMPH NODE, SENTINEL, AXILLARY;  Surgeon: Tye Millet, DO;  Location: [facility];  Service: General;  Laterality: Right;   BREAST BIOPSY Right 06/08/2024   US  RT BREAST BX W LOC DEV 1ST LESION IMG BX SPEC US  GUIDE 06/08/2024 [facility]   BREAST BIOPSY Right 06/22/2024   US  RT BREAST SAVI/RF TAG 1ST LESION US  GUIDE 06/22/2024 [facility]   BREAST LUMPECTOMY WITH RADIO FREQUENCY LOCALIZER Right 07/02/2024   Procedure: BREAST LUMPECTOMY WITH RADIO FREQUENCY LOCALIZER;  Surgeon: Tye Millet, DO;  Location: [facility];  Service: General;  Laterality: Right;   CHOLECYSTECTOMY     CLEFT PALATE REPAIR     MYRINGOTOMY WITH TUBE PLACEMENT Bilateral 06/25/2023   Procedure: MYRINGOTOMY WITH TUBE PLACEMENT (BUTTERFLY TUBES);  Surgeon: [name], MD;  Location[facility];  Service: ENT;  Laterality: Bilateral;   removal of right adrenal gland  2024   done at [facility]   VAGINA RECONSTRUCTION SURGERY  1985    FAMILY HISTORY: family history includes Bipolar disorder in her mother; Brain cancer in her paternal grandmother; Breast cancer (age of onset: 67) in her sister; Diabetes in her maternal grandmother and mother; Heart disease in her father; Hypertension in her father; Ovarian cancer in  her mother; Uterine cancer in her sister.  SOCIAL HISTORY:  reports that she quit smoking about 7 years ago. Her smoking use included cigarettes. She has never used smokeless tobacco. She reports current alcohol use of about 1.0  standard drink of alcohol per week. She reports that she does not use drugs.  ALLERGIES: Benzodiazepines, Tramadol , Morphine  and codeine , Nucynta [tapentadol], and Oxycodone   MEDICATIONS:  Current Outpatient Medications  Medication Sig Dispense Refill   acetaminophen  (TYLENOL ) 500 MG tablet Take 500-1,000 mg by mouth every 6 (six) hours as needed (pain.).     ALPRAZolam (XANAX) 0.5 MG tablet Take 0.25 mg by mouth at bedtime.     Blood Glucose Monitoring Suppl (FIFTY50 GLUCOSE METER 2.0) w/Device KIT Use as directed     cyclobenzaprine  (FLEXERIL ) 10 MG tablet Take 0.5-1 tablets (5-10 mg total) by mouth 3 (three) times daily as needed. 30 tablet 0   famotidine (PEPCID) 20 MG tablet Take 20 mg by mouth daily as needed for indigestion or heartburn.     fludrocortisone (FLORINEF) 0.1 MG tablet Take 0.1 mg by mouth in the morning.     fluticasone  (FLONASE ) 50 MCG/ACT nasal spray Place 1 spray into both nostrils daily as needed for allergies or rhinitis.     glucose blood (PRECISION QID TEST) test strip Use once daily Use as instructed.     ibuprofen  (ADVIL ) 200 MG tablet Take 2 tablets (400 mg total) by mouth every 8 (eight) hours as needed (pain.). 30 tablet 0   lidocaine -prilocaine (EMLA) cream Apply 1 Application topically once.     ondansetron  (ZOFRAN ) 4 MG tablet Take 1 tablet (4 mg total) by mouth every 8 (eight) hours as needed for nausea or vomiting. 20 tablet 0   Pediatric Multivit-Minerals (FLINTSTONES COMPLETE) CHEW Chew 1 tablet by mouth in the morning.     predniSONE (DELTASONE) 10 MG tablet Take 5-10 mg by mouth See admin instructions. 5 at night 10 am     promethazine  (PHENERGAN ) 12.5 MG tablet Take 12.5 mg by mouth every 8 (eight) hours as needed for vomiting or nausea.     simvastatin (ZOCOR) 40 MG tablet Take 40 mg by mouth at bedtime.     SitaGLIPtin-MetFORMIN HCl 50-1000 MG TB24 Take 1 tablet by mouth in the morning and at bedtime.     No current facility-administered  medications for this encounter.    ECOG PERFORMANCE STATUS:  0 - Asymptomatic  REVIEW OF SYSTEMS: Patient denies any weight loss, fatigue, weakness, fever, chills or night sweats. Patient denies any loss of vision, blurred vision. Patient denies any ringing  of the ears or hearing loss. No irregular heartbeat. Patient denies heart murmur or history of fainting. Patient denies any chest pain or pain radiating to her upper extremities. Patient denies any shortness of breath, difficulty breathing at night, cough or hemoptysis. Patient denies any swelling in the lower legs. Patient denies any nausea vomiting, vomiting of blood, or coffee ground material in the vomitus. Patient denies any stomach pain. Patient states has had normal bowel movements no significant constipation or diarrhea. Patient denies any dysuria, hematuria or significant nocturia. Patient denies any problems walking, swelling in the joints or loss of balance. Patient denies any skin changes, loss of hair or loss of weight. Patient denies any excessive worrying or anxiety or significant depression. Patient denies any problems with insomnia. Patient denies excessive thirst, polyuria, polydipsia. Patient denies any swollen glands, patient denies easy bruising or easy bleeding. Patient denies any recent infections, allergies  or URI. Patient s visual fields have not changed significantly in recent time.   PHYSICAL EXAM: BP 139/86   Pulse (!) 108   Temp 98.2 F (36.8 C) (Tympanic)   Resp 12  Patient is a wide local excision scar which is healing well of the right breast.  Still an area of Steri-Strips with some slight drainage present.  No dominant masses noted in either breast no axillary or supraclavicular adenopathy is identified.  Well-developed well-nourished patient in NAD. HEENT reveals PERLA, EOMI, discs not visualized.  Oral cavity is clear. No oral mucosal lesions are identified. Neck is clear without evidence of cervical or  supraclavicular adenopathy. Lungs are clear to A&P. Cardiac examination is essentially unremarkable with regular rate and rhythm without murmur rub or thrill. Abdomen is benign with no organomegaly or masses noted. Motor sensory and DTR levels are equal and symmetric in the upper and lower extremities. Cranial nerves II through XII are grossly intact. Proprioception is intact. No peripheral adenopathy or edema is identified. No motor or sensory levels are noted. Crude visual fields are within normal range.  LABORATORY DATA: Pathology reports reviewed    RADIOLOGY RESULTS: Mammogram and ultrasound reviewed compatible with above-stated findings   IMPRESSION: Stage Ia triple negative invasive mammary carcinoma of the right breast status post wide local excision in 53 year old female  PLAN: At this time based on the triple negative nature of her disease medical oncology may want to perform an Oncotype DX.  She sees Dr. [name] tomorrow and he will make that determination.  Based on the lymph-vascular involvement I would recommended whole breast radiation over 3 weeks boosting her scar another 1000 cGy using photon beam.  Risks and benefits of treatment including skin reaction fatigue alteration blood counts possible inclusion of superficial lung all were reviewed in detail with the patient.  I have given her a CT simulation appointment about 2 weeks to allow further healing and if Oncotype DX is to be ordered we will have that back prior to initiating treatment.  Patient will see medical oncology tomorrow.  Patient comprehends my recommendations well.  I would like to take this opportunity to thank you for allowing me to participate in the care of your patient.SABRA Marcey Penton, MD

## 2024-07-21 ENCOUNTER — Encounter: Payer: Self-pay | Admitting: Oncology

## 2024-07-21 ENCOUNTER — Inpatient Hospital Stay (HOSPITAL_BASED_OUTPATIENT_CLINIC_OR_DEPARTMENT_OTHER): Admitting: Oncology

## 2024-07-21 ENCOUNTER — Telehealth: Payer: Self-pay

## 2024-07-21 ENCOUNTER — Inpatient Hospital Stay: Admitting: Licensed Clinical Social Worker

## 2024-07-21 VITALS — BP 135/90 | HR 116 | Temp 97.8°F | Resp 17 | Wt 179.0 lb

## 2024-07-21 DIAGNOSIS — I2510 Atherosclerotic heart disease of native coronary artery without angina pectoris: Secondary | ICD-10-CM | POA: Diagnosis not present

## 2024-07-21 DIAGNOSIS — C50911 Malignant neoplasm of unspecified site of right female breast: Secondary | ICD-10-CM | POA: Diagnosis not present

## 2024-07-21 DIAGNOSIS — Z171 Estrogen receptor negative status [ER-]: Secondary | ICD-10-CM | POA: Diagnosis not present

## 2024-07-21 DIAGNOSIS — E119 Type 2 diabetes mellitus without complications: Secondary | ICD-10-CM | POA: Diagnosis not present

## 2024-07-21 DIAGNOSIS — Z808 Family history of malignant neoplasm of other organs or systems: Secondary | ICD-10-CM | POA: Diagnosis not present

## 2024-07-21 DIAGNOSIS — Z885 Allergy status to narcotic agent status: Secondary | ICD-10-CM | POA: Diagnosis not present

## 2024-07-21 DIAGNOSIS — Z79899 Other long term (current) drug therapy: Secondary | ICD-10-CM | POA: Diagnosis not present

## 2024-07-21 DIAGNOSIS — C50912 Malignant neoplasm of unspecified site of left female breast: Secondary | ICD-10-CM

## 2024-07-21 DIAGNOSIS — Z8041 Family history of malignant neoplasm of ovary: Secondary | ICD-10-CM | POA: Diagnosis not present

## 2024-07-21 DIAGNOSIS — E250 Congenital adrenogenital disorders associated with enzyme deficiency: Secondary | ICD-10-CM | POA: Diagnosis not present

## 2024-07-21 DIAGNOSIS — Z8049 Family history of malignant neoplasm of other genital organs: Secondary | ICD-10-CM | POA: Diagnosis not present

## 2024-07-21 DIAGNOSIS — Z8249 Family history of ischemic heart disease and other diseases of the circulatory system: Secondary | ICD-10-CM | POA: Diagnosis not present

## 2024-07-21 DIAGNOSIS — Z9049 Acquired absence of other specified parts of digestive tract: Secondary | ICD-10-CM | POA: Diagnosis not present

## 2024-07-21 DIAGNOSIS — C50411 Malignant neoplasm of upper-outer quadrant of right female breast: Secondary | ICD-10-CM | POA: Diagnosis not present

## 2024-07-21 DIAGNOSIS — Z833 Family history of diabetes mellitus: Secondary | ICD-10-CM | POA: Diagnosis not present

## 2024-07-21 DIAGNOSIS — Z803 Family history of malignant neoplasm of breast: Secondary | ICD-10-CM | POA: Diagnosis not present

## 2024-07-21 DIAGNOSIS — Z87730 Personal history of (corrected) cleft lip and palate: Secondary | ICD-10-CM | POA: Diagnosis not present

## 2024-07-21 DIAGNOSIS — Z818 Family history of other mental and behavioral disorders: Secondary | ICD-10-CM | POA: Diagnosis not present

## 2024-07-21 DIAGNOSIS — Z87891 Personal history of nicotine dependence: Secondary | ICD-10-CM | POA: Diagnosis not present

## 2024-07-21 DIAGNOSIS — Z17421 Hormone receptor negative with human epidermal growth factor receptor 2 negative status: Secondary | ICD-10-CM | POA: Diagnosis not present

## 2024-07-21 DIAGNOSIS — Z1722 Progesterone receptor negative status: Secondary | ICD-10-CM | POA: Diagnosis not present

## 2024-07-21 NOTE — Progress Notes (Unsigned)
 Patient here for oncology follow-up appointment, expresses no complaints or concerns at this time.

## 2024-07-21 NOTE — Progress Notes (Signed)
 [facility] Clinical Social Work  Clinical Social Work was referred by medical provider for emotional support.  Clinical Social Worker contacted patient by phone to offer support and assess for needs.     Interventions: Provided brief mental health counseling with regard to adjustment to her illness.  Obtained information about her depressive symptoms.       Follow Up Plan:  CSW will see patient on Tuesday, December 9th at 11am.    Macario CHRISTELLA Au, LCSW  Clinical Social Worker [facility]

## 2024-07-21 NOTE — Progress Notes (Unsigned)
 [facility]  Telephone:(336) [phone] Fax:(336) [phone]  ID: Tina Carrillo OB: [birth year]  MR#: [id number]  RDW#:[id number]  [name], NP as PCP - General (Family Medicine) Georgina Shasta POUR, RN as Oncology Nurse Navigator [name], [name], MD as Consulting Physician (Oncology)  CHIEF COMPLAINT: Pathologic stage Ib triple negative invasive carcinoma of right breast.  INTERVAL HISTORY: Patient returns to clinic today for further evaluation, discussion of her final pathology results, and additional treatment planning.  Patient underwent lumpectomy on July 02, 2024 upgrading her malignancy from DCIS to the above-stated triple negative invasive carcinoma.  She continues to be anxious, but otherwise feels well.  She has no neurologic complaints.  She denies any recent fevers or illnesses.  She has a good appetite and denies weight loss.  She has no chest pain, shortness of breath, cough, or hemoptysis.  She denies any nausea, vomiting, constipation, or diarrhea.  She has no urinary complaints.  Patient offers no further specific complaints today.  REVIEW OF SYSTEMS:   Review of Systems  Constitutional: Negative.  Negative for fever, malaise/fatigue and weight loss.  Respiratory: Negative.  Negative for cough, hemoptysis and shortness of breath.   Cardiovascular: Negative.  Negative for chest pain and leg swelling.  Gastrointestinal: Negative.  Negative for abdominal pain.  Genitourinary: Negative.  Negative for dysuria.  Musculoskeletal: Negative.  Negative for back pain.  Skin: Negative.  Negative for rash.  Neurological: Negative.  Negative for dizziness, focal weakness, weakness and headaches.  Psychiatric/Behavioral:  The patient is nervous/anxious.     As per HPI. Otherwise, a complete review of systems is negative.  PAST MEDICAL HISTORY: Past Medical History:  Diagnosis Date   Adrenal benign tumor    Adrenal hyperplasia    Anxiety     Diabetes mellitus without complication (HCC)    Ductal carcinoma in situ (DCIS) of right breast    GERD (gastroesophageal reflux disease)    History of repair of congenital cleft palate    Leukocytosis    a.) in setting of chronic corticosteroid use; b.) seen in consult by hematology 2019 --> flow cytometry revealed no immunophenotypic abnormality (no monocolon B cell population or neoplastic T cell process)   PONV (postoperative nausea and vomiting)    Sleep apnea    a.) unable to afford DME required for nocturnal PAP    PAST SURGICAL HISTORY: Past Surgical History:  Procedure Laterality Date   AXILLARY SENTINEL NODE BIOPSY Right 07/02/2024   Procedure: BIOPSY, LYMPH NODE, SENTINEL, AXILLARY;  Surgeon: Tye Millet, DO;  Location: [facility];  Service: General;  Laterality: Right;   BREAST BIOPSY Right 06/08/2024   US  RT BREAST BX W LOC DEV 1ST LESION IMG BX SPEC US  GUIDE 06/08/2024 [facility]   BREAST BIOPSY Right 06/22/2024   US  RT BREAST SAVI/RF TAG 1ST LESION US  GUIDE 06/22/2024 [facility]   BREAST LUMPECTOMY WITH RADIO FREQUENCY LOCALIZER Right 07/02/2024   Procedure: BREAST LUMPECTOMY WITH RADIO FREQUENCY LOCALIZER;  Surgeon: Tye Millet, DO;  Location: [facility];  Service: General;  Laterality: Right;   CHOLECYSTECTOMY     CLEFT PALATE REPAIR     MYRINGOTOMY WITH TUBE PLACEMENT Bilateral 06/25/2023   Procedure: MYRINGOTOMY WITH TUBE PLACEMENT (BUTTERFLY TUBES);  Surgeon: [name], MD;  Location: [facility];  Service: ENT;  Laterality: Bilateral;   removal of right adrenal gland  2024   done at [facility]   VAGINA RECONSTRUCTION SURGERY  1985    FAMILY HISTORY: Family History  Problem Relation Age of Onset   Ovarian cancer Mother    Bipolar disorder Mother    Diabetes Mother    Uterine cancer Sister    Breast cancer Sister 71       mat half sister   Hypertension Father    Heart disease Father    Diabetes Maternal Grandmother    Brain cancer  Paternal Grandmother     ADVANCED DIRECTIVES (Y/N):  N  HEALTH MAINTENANCE: Social History   Tobacco Use   Smoking status: Former    Current packs/day: 0.00    Types: Cigarettes    Quit date: 2018    Years since quitting: 7.9   Smokeless tobacco: Never  Vaping Use   Vaping status: Never Used  Substance Use Topics   Alcohol use: Yes    Alcohol/week: 1.0 standard drink of alcohol    Types: 1 Cans of beer per week    Comment: rarely   Drug use: No     Colonoscopy:  PAP:  Bone density:  Lipid panel:  Allergies  Allergen Reactions   Benzodiazepines Hives   Tramadol  Nausea And Vomiting   Morphine  And Codeine  Nausea And Vomiting   Nucynta [Tapentadol] Nausea And Vomiting   Oxycodone  Itching    Current Outpatient Medications  Medication Sig Dispense Refill   acetaminophen  (TYLENOL ) 500 MG tablet Take 500-1,000 mg by mouth every 6 (six) hours as needed (pain.).     ALPRAZolam (XANAX) 0.5 MG tablet Take 0.25 mg by mouth at bedtime.     Blood Glucose Monitoring Suppl (FIFTY50 GLUCOSE METER 2.0) w/Device KIT Use as directed     cyclobenzaprine  (FLEXERIL ) 10 MG tablet Take 0.5-1 tablets (5-10 mg total) by mouth 3 (three) times daily as needed. 30 tablet 0   famotidine (PEPCID) 20 MG tablet Take 20 mg by mouth daily as needed for indigestion or heartburn.     fludrocortisone (FLORINEF) 0.1 MG tablet Take 0.1 mg by mouth in the morning.     fluticasone  (FLONASE ) 50 MCG/ACT nasal spray Place 1 spray into both nostrils daily as needed for allergies or rhinitis.     glucose blood (PRECISION QID TEST) test strip Use once daily Use as instructed.     ibuprofen  (ADVIL ) 200 MG tablet Take 2 tablets (400 mg total) by mouth every 8 (eight) hours as needed (pain.). 30 tablet 0   lidocaine -prilocaine (EMLA) cream Apply 1 Application topically once.     ondansetron  (ZOFRAN ) 4 MG tablet Take 1 tablet (4 mg total) by mouth every 8 (eight) hours as needed for nausea or vomiting. 20 tablet 0    Pediatric Multivit-Minerals (FLINTSTONES COMPLETE) CHEW Chew 1 tablet by mouth in the morning.     predniSONE (DELTASONE) 10 MG tablet Take 5-10 mg by mouth See admin instructions. 5 at night 10 am     promethazine  (PHENERGAN ) 12.5 MG tablet Take 12.5 mg by mouth every 8 (eight) hours as needed for vomiting or nausea.     simvastatin (ZOCOR) 40 MG tablet Take 40 mg by mouth at bedtime.     SitaGLIPtin-MetFORMIN HCl 50-1000 MG TB24 Take 1 tablet by mouth in the morning and at bedtime.     No current facility-administered medications for this visit.    OBJECTIVE: Vitals:   07/21/24 1025  BP: (!) 135/90  Pulse: (!) 116  Resp: 17  Temp: 97.8 F (36.6 C)  SpO2: 100%     Body mass index is 36.15 kg/m.    ECOG FS:0 - Asymptomatic  General: Well-developed, well-nourished, no acute distress. Eyes: Pink conjunctiva, anicteric sclera. HEENT: Normocephalic, moist mucous membranes. Lungs: No audible wheezing or coughing. Heart: Regular rate and rhythm. Abdomen: Soft, nontender, no obvious distention. Musculoskeletal: No edema, cyanosis, or clubbing. Neuro: Alert, answering all questions appropriately. Cranial nerves grossly intact. Skin: No rashes or petechiae noted. Psych: Normal affect.  LAB RESULTS:  Lab Results  Component Value Date   NA 135 06/28/2024   K 4.4 06/28/2024   CL 97 (L) 06/28/2024   CO2 26 06/28/2024   GLUCOSE 182 (H) 06/28/2024   BUN 22 (H) 06/28/2024   CREATININE 0.71 06/28/2024   CALCIUM 9.4 06/28/2024   PROT 7.8 05/19/2018   ALBUMIN 4.4 05/19/2018   AST 21 05/19/2018   ALT 28 05/19/2018   ALKPHOS 71 05/19/2018   BILITOT 0.4 05/19/2018   GFRNONAA >60 06/28/2024   GFRAA >60 09/08/2018    Lab Results  Component Value Date   WBC 18.2 (H) 06/28/2024   NEUTROABS 11.6 (H) 09/08/2018   HGB 13.3 06/28/2024   HCT 41.4 06/28/2024   MCV 84.1 06/28/2024   PLT 306 06/28/2024     STUDIES: MM Breast Surgical Specimen Result Date: 07/02/2024 CLINICAL DATA:   Status post Savi Scout localized RIGHT breast lumpectomy. Patient is status post ultrasound-guided biopsy of the RIGHT breast which demonstrated DCIS (RIBBON clip) EXAM: SPECIMEN RADIOGRAPH OF THE RIGHT BREAST COMPARISON:  Previous exam(s). FINDINGS: Status post excision of the RIGHT breast. [facility] reflector and RIBBON shaped clip are present within the specimen. IMPRESSION: Specimen radiograph of the RIGHT breast. Electronically Signed   By: Corean Salter M.D.   On: 07/02/2024 11:04   NM Sentinel Node Inj-No Rpt (Breast) Result Date: 07/02/2024 Lymphoseek was injected by the Nuclear Medicine Technologist for sentinel lymph node localization.   MM 3D DIAGNOSTIC MAMMOGRAM UNILATERAL RIGHT BREAST Result Date: 06/22/2024 CLINICAL DATA:  Status post RIGHT breast ultrasound-guided SAVI scout placement for DCIS prior to lumpectomy EXAM: DIGITAL DIAGNOSTIC UNILATERAL RIGHT MAMMOGRAM WITH TOMOSYNTHESIS AND CAD TECHNIQUE: Right digital diagnostic mammography and breast tomosynthesis was performed. The images were evaluated with computer-aided detection. COMPARISON:  Previous exam(s). ACR Breast Density Category b: There are scattered areas of fibroglandular density. FINDINGS: Redemonstrated RIGHT breast mass at the 10 o'clock position containing a ribbon biopsy marking clip, corresponding with biopsy-proven DCIS. A SAVI scout marker is located within the mass. There are no new suspicious findings in the RIGHT breast. IMPRESSION: Successful SAVI scout localization prior to RIGHT breast lumpectomy. No new suspicious findings in the RIGHT breast. RECOMMENDATION: Continued oncologic treatment per clinical team. I have discussed the findings and recommendations with the patient. If applicable, a reminder letter will be sent to the patient regarding the next appointment. BI-RADS CATEGORY  6: Known biopsy-proven malignancy. Electronically Signed   By: Norleen Croak M.D.   On: 06/22/2024 16:20   US  RT BREAST  SAVI/RF TAG 1ST LESION US  GUIDE Result Date: 06/22/2024 CLINICAL DATA:  RIGHT breast DCIS diagnosed via ultrasound-guided biopsy on June 08, 2024. EXAM: NEEDLE LOCALIZATION OF THE RIGHT BREAST WITH ULTRASOUND GUIDANCE COMPARISON:  Previous exam(s). FINDINGS: Patient presents for needle localization prior to RIGHT breast lumpectomy for DCIS. I met with the patient and we discussed the procedure of needle localization including benefits and alternatives. We discussed the high likelihood of a successful procedure. We discussed the risks of the procedure, including infection, bleeding, tissue injury, and further surgery. Informed, written consent was given. The usual time-out protocol was performed immediately prior to  the procedure. Using ultrasound guidance, sterile technique, 1% lidocaine  and a 10 cm SAVI SCOUT needle, the RIGHT breast mass at 10 o'clock 10 cm from the nipple was localized using a anti radial approach. The images were marked for Dr. Tye IMPRESSION: Radar reflector localization of the RIGHT breast. No apparent complications. Electronically Signed   By: Norleen Croak M.D.   On: 06/22/2024 16:10    ASSESSMENT: Pathologic stage Ib triple negative invasive carcinoma of right breast.  PLAN:    Pathologic stage Ib triple negative invasive carcinoma of right breast: Patient underwent lumpectomy on July 02, 2024 upgrading her malignancy to invasive triple negative carcinoma.  She does not require Oncotype testing.  We discussed at length the recommendation of adjuvant chemotherapy using Adriamycin, Cytoxan, and Taxol.  Given the small size of her malignancy, can also consider Taxotere and Cytoxan only.  Patient is unsure if she wishes to undergo chemotherapy at all and plans to further discuss her options with her family.  She plans to attend chemo education class for further information regarding her regimen.  Either way, patient will require adjuvant XRT.  An aromatase inhibitor would not be  of benefit given the triple negative status of her malignancy.  Return to clinic in approximately 1 to 2 weeks for further evaluation and continued discussion of her treatment options.   Congenital adrenal hyperplasia/diabetes: Continue follow-up and treatment per endocrinology. Genetics: Patient was given a referral to genetic counseling.  I spent a total of 30 minutes reviewing chart data, face-to-face evaluation with the patient, counseling and coordination of care as detailed above.    Patient expressed understanding and was in agreement with this plan. She also understands that She can call clinic at any time with any questions, concerns, or complaints.    Cancer Staging  Invasive ductal carcinoma of right breast [facility]) Staging form: Breast, AJCC 8th Edition - Pathologic stage from 07/22/2024: Stage IB (pT1b, pN0, cM0, G3, ER-, PR-, HER2-) - Signed by [name] [name], MD on 07/22/2024 Stage prefix: Initial diagnosis Histologic grading system: 3 grade system   [name] [name], MD   07/22/2024 9:01 AM

## 2024-07-21 NOTE — Telephone Encounter (Signed)
 Clinical Social Work was referred by medical provider for assessment of psychosocial needs.  CSW attempted to contact patient by phone.  Left voicemail with contact information and request for return call.

## 2024-07-22 DIAGNOSIS — C50912 Malignant neoplasm of unspecified site of left female breast: Secondary | ICD-10-CM | POA: Insufficient documentation

## 2024-07-22 DIAGNOSIS — C50911 Malignant neoplasm of unspecified site of right female breast: Secondary | ICD-10-CM | POA: Insufficient documentation

## 2024-07-27 ENCOUNTER — Inpatient Hospital Stay: Attending: Oncology

## 2024-07-27 DIAGNOSIS — Z87730 Personal history of (corrected) cleft lip and palate: Secondary | ICD-10-CM | POA: Insufficient documentation

## 2024-07-27 DIAGNOSIS — Z885 Allergy status to narcotic agent status: Secondary | ICD-10-CM | POA: Insufficient documentation

## 2024-07-27 DIAGNOSIS — Z79899 Other long term (current) drug therapy: Secondary | ICD-10-CM | POA: Insufficient documentation

## 2024-07-27 DIAGNOSIS — Z818 Family history of other mental and behavioral disorders: Secondary | ICD-10-CM | POA: Insufficient documentation

## 2024-07-27 DIAGNOSIS — E250 Congenital adrenogenital disorders associated with enzyme deficiency: Secondary | ICD-10-CM | POA: Insufficient documentation

## 2024-07-27 DIAGNOSIS — C50912 Malignant neoplasm of unspecified site of left female breast: Secondary | ICD-10-CM

## 2024-07-27 DIAGNOSIS — Z8049 Family history of malignant neoplasm of other genital organs: Secondary | ICD-10-CM | POA: Insufficient documentation

## 2024-07-27 DIAGNOSIS — Z9049 Acquired absence of other specified parts of digestive tract: Secondary | ICD-10-CM | POA: Insufficient documentation

## 2024-07-27 DIAGNOSIS — Z8041 Family history of malignant neoplasm of ovary: Secondary | ICD-10-CM | POA: Insufficient documentation

## 2024-07-27 DIAGNOSIS — E119 Type 2 diabetes mellitus without complications: Secondary | ICD-10-CM | POA: Insufficient documentation

## 2024-07-27 DIAGNOSIS — Z803 Family history of malignant neoplasm of breast: Secondary | ICD-10-CM | POA: Insufficient documentation

## 2024-07-27 DIAGNOSIS — Z17421 Hormone receptor negative with human epidermal growth factor receptor 2 negative status: Secondary | ICD-10-CM | POA: Insufficient documentation

## 2024-07-27 DIAGNOSIS — C50411 Malignant neoplasm of upper-outer quadrant of right female breast: Secondary | ICD-10-CM | POA: Insufficient documentation

## 2024-07-27 DIAGNOSIS — Z808 Family history of malignant neoplasm of other organs or systems: Secondary | ICD-10-CM | POA: Insufficient documentation

## 2024-07-27 DIAGNOSIS — Z833 Family history of diabetes mellitus: Secondary | ICD-10-CM | POA: Insufficient documentation

## 2024-07-27 DIAGNOSIS — Z8249 Family history of ischemic heart disease and other diseases of the circulatory system: Secondary | ICD-10-CM | POA: Insufficient documentation

## 2024-07-27 DIAGNOSIS — Z87891 Personal history of nicotine dependence: Secondary | ICD-10-CM | POA: Insufficient documentation

## 2024-07-28 ENCOUNTER — Inpatient Hospital Stay: Admitting: Occupational Therapy

## 2024-07-28 ENCOUNTER — Encounter: Payer: Self-pay | Admitting: Occupational Therapy

## 2024-07-28 ENCOUNTER — Inpatient Hospital Stay: Attending: Oncology

## 2024-07-28 ENCOUNTER — Other Ambulatory Visit: Payer: Self-pay | Admitting: *Deleted

## 2024-07-28 ENCOUNTER — Encounter: Payer: Self-pay | Admitting: *Deleted

## 2024-07-28 ENCOUNTER — Other Ambulatory Visit: Payer: Self-pay | Admitting: Oncology

## 2024-07-28 DIAGNOSIS — Z17421 Hormone receptor negative with human epidermal growth factor receptor 2 negative status: Secondary | ICD-10-CM | POA: Diagnosis not present

## 2024-07-28 DIAGNOSIS — Z8249 Family history of ischemic heart disease and other diseases of the circulatory system: Secondary | ICD-10-CM | POA: Diagnosis not present

## 2024-07-28 DIAGNOSIS — C50411 Malignant neoplasm of upper-outer quadrant of right female breast: Secondary | ICD-10-CM | POA: Diagnosis present

## 2024-07-28 DIAGNOSIS — Z01818 Encounter for other preprocedural examination: Secondary | ICD-10-CM

## 2024-07-28 DIAGNOSIS — Z8049 Family history of malignant neoplasm of other genital organs: Secondary | ICD-10-CM | POA: Diagnosis not present

## 2024-07-28 DIAGNOSIS — Z808 Family history of malignant neoplasm of other organs or systems: Secondary | ICD-10-CM | POA: Diagnosis not present

## 2024-07-28 DIAGNOSIS — Z833 Family history of diabetes mellitus: Secondary | ICD-10-CM | POA: Diagnosis not present

## 2024-07-28 DIAGNOSIS — Z803 Family history of malignant neoplasm of breast: Secondary | ICD-10-CM | POA: Diagnosis not present

## 2024-07-28 DIAGNOSIS — Z9049 Acquired absence of other specified parts of digestive tract: Secondary | ICD-10-CM | POA: Diagnosis not present

## 2024-07-28 DIAGNOSIS — Z87891 Personal history of nicotine dependence: Secondary | ICD-10-CM | POA: Diagnosis not present

## 2024-07-28 DIAGNOSIS — Z87730 Personal history of (corrected) cleft lip and palate: Secondary | ICD-10-CM | POA: Diagnosis not present

## 2024-07-28 DIAGNOSIS — Z818 Family history of other mental and behavioral disorders: Secondary | ICD-10-CM | POA: Diagnosis not present

## 2024-07-28 DIAGNOSIS — Z79899 Other long term (current) drug therapy: Secondary | ICD-10-CM | POA: Diagnosis not present

## 2024-07-28 DIAGNOSIS — Z9889 Other specified postprocedural states: Secondary | ICD-10-CM | POA: Insufficient documentation

## 2024-07-28 DIAGNOSIS — E119 Type 2 diabetes mellitus without complications: Secondary | ICD-10-CM | POA: Diagnosis not present

## 2024-07-28 DIAGNOSIS — Z8041 Family history of malignant neoplasm of ovary: Secondary | ICD-10-CM | POA: Diagnosis not present

## 2024-07-28 DIAGNOSIS — Z885 Allergy status to narcotic agent status: Secondary | ICD-10-CM | POA: Diagnosis not present

## 2024-07-28 DIAGNOSIS — E250 Congenital adrenogenital disorders associated with enzyme deficiency: Secondary | ICD-10-CM | POA: Diagnosis not present

## 2024-07-28 NOTE — Progress Notes (Signed)
 [facility] Psychosocial Distress Screening Clinical Social Work  ORETTA BERKLAND is a 53 y.o. year old female. Clinical Social Work was referred by nurse for positive distress screening. The patient scored a 8 on the Psychosocial Distress Thermometer which indicates moderate distress. Clinical Social Worker contacted patient by phone to assess for distress and other psychosocial needs.     Distress Screen:    07/27/2024    7:26 PM  ONCBCN DISTRESS SCREENING  Screening Type Initial Screening  How much distress have you been experiencing in the past week? (0-10) 8  Practical concerns type Finances  Social concerns type Relationship with spouse or partner;Relationship with family members  Emotional concerns type Worry or anxiety;Sadness or depression;Loss of interest or enjoyment;Fear;Anger;Feelings of worthlessness or being a burden  Spiritual/Religous concerns type Changes in faith or beliefs;Death, dying, or afterlife  Physical Concerns Type  Pain;Sleep;Fatigue;Changes in eating;Loss or change of physical abilities    Interventions: Patient discussed areas of stress. Patient feels comfortable with discussing further with appointment with primary csw.  CSW and patient discussed common feeling and emotions when being diagnosed with cancer, and the importance of support during treatment.    Follow Up Plan: Primary CSW will see patient for counseling on 12/08  Patient verbalizes understanding of plan: Yes   Lizbeth Sprague, LCSW

## 2024-07-28 NOTE — Therapy (Signed)
 OUTPATIENT OCCUPATIONAL THERAPY BREAST CANCER POSTOP EVALUATION   Patient Name: Tina Carrillo MRN: [id number] DOB:1970/12/23, 53 y.o., female Today's Date: 07/28/2024  END OF SESSION:  OT End of Session - 07/28/24 1843     Visit Number 1    Number of Visits 2    Date for Recertification  10/20/24    OT Start Time 1000    OT Stop Time 1035    OT Time Calculation (min) 35 min    Activity Tolerance Patient tolerated treatment well    Behavior During Therapy WFL for tasks assessed/performed          Past Medical History:  Diagnosis Date   Adrenal benign tumor    Adrenal hyperplasia    Anxiety    Diabetes mellitus without complication (HCC)    Ductal carcinoma in situ (DCIS) of right breast    GERD (gastroesophageal reflux disease)    History of repair of congenital cleft palate    Leukocytosis    a.) in setting of chronic corticosteroid use; b.) seen in consult by hematology 2019 --> flow cytometry revealed no immunophenotypic abnormality (no monocolon B cell population or neoplastic T cell process)   PONV (postoperative nausea and vomiting)    Sleep apnea    a.) unable to afford DME required for nocturnal PAP   Past Surgical History:  Procedure Laterality Date   AXILLARY SENTINEL NODE BIOPSY Right 07/02/2024   Procedure: BIOPSY, LYMPH NODE, SENTINEL, AXILLARY;  Surgeon: Tye Millet, DO;  Location: [facility];  Service: General;  Laterality: Right;   BREAST BIOPSY Right 06/08/2024   US  RT BREAST BX W LOC DEV 1ST LESION IMG BX SPEC US  GUIDE 06/08/2024 [facility]   BREAST BIOPSY Right 06/22/2024   US  RT BREAST SAVI/RF TAG 1ST LESION US  GUIDE 06/22/2024 [facility]   BREAST LUMPECTOMY WITH RADIO FREQUENCY LOCALIZER Right 07/02/2024   Procedure: BREAST LUMPECTOMY WITH RADIO FREQUENCY LOCALIZER;  Surgeon: Tye Millet, DO;  Location: [facility];  Service: General;  Laterality: Right;   CHOLECYSTECTOMY     CLEFT PALATE REPAIR     MYRINGOTOMY WITH TUBE PLACEMENT  Bilateral 06/25/2023   Procedure: MYRINGOTOMY WITH TUBE PLACEMENT (BUTTERFLY TUBES);  Surgeon: [name], MD;  Location: [facility];  Service: ENT;  Laterality: Bilateral;   removal of right adrenal gland  2024   done at [facility]   VAGINA RECONSTRUCTION SURGERY  1985   Patient Active Problem List   Diagnosis Date Noted   Invasive ductal carcinoma of right breast (HCC) 07/22/2024   Vaginal agenesis, partial 05/19/2018   Mood disorder 12/01/2017   Adjustment insomnia 12/01/2017   Adrenal mass 05/08/2016   Type 2 diabetes mellitus without complication, without long-term current use of insulin  (HCC) 10/17/2015   Environmental and seasonal allergies 06/14/2015   Congenital adrenal hyperplasia 08/16/2014    PCP: Harvey NP  REFERRING PROVIDER: Dr [name]  REFERRING DIAG: R lumpectomy  THERAPY DIAG:  Status post right breast lumpectomy  Rationale for Evaluation and Treatment: Rehabilitation  ONSET DATE: 07/02/24  SUBJECTIVE:  SUBJECTIVE STATEMENT: Patient reports she is here today after being refer by one of her medical team after having a right lumpectomy.  PERTINENT HISTORY:  07/14/24 Dr Tye note  Assessment:    Ductal carcinoma in situ (DCIS) of right breast [D05.11] S/p lumpectomy, SLNB  Plan:    1. Healing well. Sutures remvoed at bedside No issues. Additional serosanguinous drainage noted as expected. Wound epithelium with some separation as expected, secured back down with steristrips. Discussed with patient she may continue to notice drainage for next few weeks. Call back office with any concerns, but hopefully should resolve on own. Further care per oncology team. Will be available. F/u one year for diagnostic mammo.  PATIENT GOALS:   reduce lymphedema risk and learn post  op HEP.   PAIN:  Are you having pain? no  PRECAUTIONS: Active CA , R UE lymphedema    HAND DOMINANCE: right  WEIGHT BEARING RESTRICTIONS: No  FALLS:  Has patient fallen in last 6 months? No  LIVING ENVIRONMENT: Patient lives with: Wife  OCCUPATION: Works full-time as a Financial Controller: Patient's job involves sometimes lifting or pushing or pulling, loves yardwork and fishing and active around [employer]; has a dog   OBJECTIVE:  COGNITION: Overall cognitive status: Within functional limits for tasks assessed    POSTURE:  rounded shoulders posture  UPPER EXTREMITY AROM/PROM:  Bilateral shoulder active range of motion within normal limits.   Strength 5 -/5 for shoulders in all ranges and planes  CERVICAL AROM: All within normal limits:    LYMPHEDEMA ASSESSMENTS:    L-DEX LYMPHEDEMA SCREENING:  The patient was assessed using the L-Dex machine today to produce a lymphedema index baseline score. The patient will be reassessed on a regular basis (typically every 3 months) to obtain new L-Dex scores. If the score is > 6.5 points away from his/her baseline score indicating onset of subclinical lymphedema, it will be recommended to wear a compression garment for 4 weeks, 12 hours per day and then be reassessed. If the score continues to be > 6.5 points from baseline at reassessment, we will initiate lymphedema treatment. Assessing in this manner has a 95% rate of preventing clinically significant lymphedema.   L-DEX FLOWSHEETS - 07/28/24 1800       L-DEX LYMPHEDEMA SCREENING   Measurement Type Unilateral    L-DEX MEASUREMENT EXTREMITY Upper Extremity    POSITION  Standing    DOMINANT SIDE Right    At Risk Side Right    BASELINE SCORE (UNILATERAL) -0.6            PATIENT EDUCATION:  Education details: Lymphedema risk reduction -education was done and handout provided and reviewed with patient-also provided with patient braces to wear when going to the  hospital or have lab work. Person educated: Patient Education method: Explanation, Demonstration, Handout Education comprehension: Patient verbalized understanding and returned demonstration  HOME EXERCISE PROGRAM: reviewed with patient to gradually increase her strength with lifting and pulling and pushing. Plan is for patient to have chemotherapy.  Reviewed with patient to be active around 250 minutes a week dividing that in about 24 to 25 minutes a day. Can divide that in 4 sessions of 6 minutes or 2 sessions of 12. Try and walk daily. Can also do some sit and stand out of chair using no hands 6-10 reps, sidestepping at the kitchen counter 1 to 2 minutes as well as heel raises holding onto counter.  10 reps Patient verbalized understanding.  ASSESSMENT:  CLINICAL IMPRESSION: Patient presented OT evaluation postop right lumpectomy on 07/02/2024 by Dr. Tye.  Patient is active range of motion within normal limits.  Strength 5 -/5 but pain-free.  Did educate patient on signs and symptoms as well as prevention of lymphedema.  Handout was reviewed and provided for patient that was in cancer journal.  Patient is having chemotherapy.  Reviewed with patient some exercises to be performed during chemotherapy to maintain her strength and endurance.  Patient L-Dex score for lymphedema was within normal range.  And can be done by breast navigator in 3 months- L-Dex screens every 3 months for 2 years to detect subclinical lymphedema.  No follow-up is needed at this time but can be referred in the future if need to return to OT.  Pt will benefit from skilled therapeutic intervention to improve on the following deficits: Decreased knowledge of precautions and lymphedema education, impaired UE functional use, pain, decreased ROM, postural dysfunction.   OT treatment/interventions: ADL/self-care home management, pt/family education, therapeutic exercise,manual therapy  REHAB POTENTIAL: Good  CLINICAL  DECISION MAKING: Stable/uncomplicated  EVALUATION COMPLEXITY: Low   GOALS: Goals reviewed with patient? YES  LONG TERM GOALS: (STG=LTG)    Name Target Date Goal status  1 Pt will be able to verbalize understanding of pertinent lymphedema risk reduction practices relevant to her dx specifically related to skin care.  Baseline:  No knowledge Today Achieved at eval  2 Pt will be able to return demo and/or verbalize understanding of the post op HEP related to regaining shoulder ROM. Baseline:  No knowledge Today Achieved at eval       4 Pt will demo she has regained full shoulder ROM and function post operatively compared to baselines.  Baseline: See objective measurements taken today. Today Initial    PLAN:  OT FREQUENCY/DURATION: EVAL and 1 follow-up visit if needed  Occupational Therapy Information for After Breast Cancer Surgery/Treatment:  Lymphedema is a swelling condition that you may be at risk for in your arm if you have lymph nodes removed from the armpit area.  After a sentinel node biopsy, the risk is approximately 5-9% and is higher after an axillary node dissection.  There is treatment available for this condition and it is not life-threatening.  Contact your physician or occupational therapist with concerns. You may begin the 4 shoulder/posture exercises (see additional sheet) when permitted by your physician (typically a week after surgery).  If you have drains, you may need to wait until those are removed before beginning range of motion exercises.  A general recommendation is to not lift your arms above shoulder height until drains are removed.  These exercises should be done to your tolerance and gently.  This is not a no pain/no gain type of recovery so listen to your body and stretch into the range of motion that you can tolerate, stopping if you have pain.  If you are having immediate reconstruction, ask your plastic surgeon about doing exercises as he or she may want  you to wait. .  While undergoing any medical procedure or treatment, try to avoid blood pressure being taken or needle sticks from occurring on the arm on the side of cancer.   This recommendation begins after surgery and continues for the rest of your life.  This may help reduce your risk of getting lymphedema (swelling in your arm). An excellent resource for those seeking information on lymphedema is the National Lymphedema Network's web site. It can be accessed at www.lymphnet.org If  you notice swelling in your hand, arm or breast at any time following surgery (even if it is many years from now), please contact your doctor or occupational therapist to discuss this.  Lymphedema can be treated at any time but it is easier for you if it is treated early on.  If you feel like your shoulder motion is not returning to normal in a reasonable amount of time, please contact your surgeon or occupational therapist.  [facility] [phone]. [address], [place], [place] [address]       Ancel Peters, OTR,L, CLT 07/28/2024, 6:45 PM

## 2024-07-28 NOTE — Progress Notes (Signed)
 Tina Carrillo has decided to move forward with chemotherapy, she would still like to keep her appointment with Dr. [name] but would like to start getting other appointments scheduled.   She would also like Dr. [name] to discuss the plan with her endocrinologist Dr. Damian due to her adrenal insufficiency.   Dr. [name] notified of above.

## 2024-07-29 ENCOUNTER — Telehealth: Payer: Self-pay

## 2024-07-29 ENCOUNTER — Encounter: Payer: Self-pay | Admitting: Oncology

## 2024-07-29 DIAGNOSIS — E1169 Type 2 diabetes mellitus with other specified complication: Secondary | ICD-10-CM | POA: Diagnosis not present

## 2024-07-29 DIAGNOSIS — E278 Other specified disorders of adrenal gland: Secondary | ICD-10-CM | POA: Diagnosis not present

## 2024-07-29 DIAGNOSIS — D1779 Benign lipomatous neoplasm of other sites: Secondary | ICD-10-CM | POA: Diagnosis not present

## 2024-07-29 DIAGNOSIS — Z1331 Encounter for screening for depression: Secondary | ICD-10-CM | POA: Diagnosis not present

## 2024-07-29 DIAGNOSIS — E250 Congenital adrenogenital disorders associated with enzyme deficiency: Secondary | ICD-10-CM | POA: Diagnosis not present

## 2024-07-29 NOTE — Telephone Encounter (Signed)
 [employer] short term disability claim form received.  Patient does not have tx plan yet but does have visit with Dr. [name] on 08/03/24 to finalize tx.    Will need to wait for tx plan to complete disability claim form due to questions that need to be answered.  For example, what was patient's first treatment date after last date work.

## 2024-08-02 ENCOUNTER — Inpatient Hospital Stay

## 2024-08-02 DIAGNOSIS — D0511 Intraductal carcinoma in situ of right breast: Secondary | ICD-10-CM | POA: Diagnosis not present

## 2024-08-02 NOTE — Progress Notes (Signed)
 Subjective:   CC: Ductal carcinoma in situ (DCIS) of right breast [D05.11]  HPI: Returns for evaluation of above. Proceeding with port placement to start chemo.  Also mentions how she still has not returned to work due to drainage issues from her wound.  History of Present Illness     Past Medical History:  has a past medical history of Adrenal adenoma (2017), Congenital adrenal hyperplasia (HHS-HCC), MASLD, Post-menopausal, and Type 2 diabetes mellitus (CMS/HHS-HCC).  Past Surgical History:  has a past surgical history that includes vaginal reconstruction; Cleft palate repair; BILATERAL TUBES IN EARS; Cholecystectomy; and mastectomy partial (Right, 07/02/2024).  Family History: family history includes Bipolar disorder in her mother; Colon cancer in her maternal grandmother; Colon polyps in her father; Diabetes in her maternal grandmother; High blood pressure (Hypertension) in her sister; Irregular Heart Beat (Arrhythmia) in her father; Other in her mother; Ovarian cancer in her sister.  Social History:  reports that she has quit smoking. She has never used smokeless tobacco. She reports current alcohol use. She reports that she does not use drugs.  Current Medications: has a current medication list which includes the following prescription(s): acetaminophen , alprazolam, cyclobenzaprine , fludrocortisone, fluticasone  propionate, janumet xr, lidocaine -prilocaine , multivitamin, pediatric multivit-iron-min, prednisone, simvastatin, solu-cortef  act-o-vial (pf), syringe with needle, safety, dapagliflozin propanediol, hydrocodone -acetaminophen , and ondansetron .  Allergies:  Allergies  Allergen Reactions  . Tramadol  Nausea And Vomiting  . Morphine  Vomiting and Nausea And Vomiting  . Oxycodone  Itching  . Tapentadol Nausea And Vomiting    ROS:  A 15 point review of systems was performed and pertinent positives and negatives noted in HPI   Objective:     BP 136/84   Pulse (!) 130   Ht  149.9 cm (4' 11)   Wt 83 kg (183 lb)   BMI 36.96 kg/m   Constitutional :  No distress, cooperative, alert  Lymphatics/Throat:  Supple with no lymphadenopathy  Respiratory:  Clear to auscultation bilaterally  Cardiovascular:  Regular rate and rhythm  Gastrointestinal: Soft, non-tender, non-distended, no organomegaly.  Musculoskeletal: Steady gait and movement  Skin: Cool and moist  Psychiatric: Normal affect, non-agitated, not confused  Breast: Chaperone present for exam.  Lumpectomy healing well except for 5mm area of scab noted, with no surrounding skin changes.  Pinpoint serosanguinous drainage noted on bra over area.      LABS:  N/a   RADS: N/a  Assessment:      Ductal carcinoma in situ (DCIS) of right breast [D05.11]  Plan:     1. Ductal carcinoma in situ (DCIS) of right breast [D05.11] Discussed surgical excision.  Alternatives include continued observation.  Benefits include possible symptom relief, pathologic evaluation, improved cosmesis. Discussed the risk of surgery including recurrence, chronic pain, post-op infxn, poor cosmesis, poor/delayed wound healing, and possible re-operation to address said risks. The risks of general anesthetic, if used, includes MI, CVA, sudden death or even reaction to anesthetic medications also discussed.  Typical post-op recovery time of 3-5 days with possible activity restrictions were also discussed.  The patient verbalized understanding and all questions were answered to the patient's satisfaction.  2. Patient has elected to proceed with surgical treatment. Procedure will be scheduled pending no issues with echocardiogram that is scheduled for jan 8th.  Pt will call office with results once available, and then we can schedule port placement.  Will also update her disability paperwork so she is covered for additional days she missed from her lumpectomy due to the persistent leakage from incisional wound since previous  post op visit.   Will extend another 4weeks from today to ensure wound heals completely.  Last paperwork approved leave from 07/02/2024-11/20/205  labs/images/medications/previous chart entries reviewed personally and relevant changes/updates noted above.

## 2024-08-02 NOTE — Progress Notes (Signed)
 Tumor Board Documentation  Tina Carrillo was presented by Dr. [name] at our Tumor Board on 08/02/2024, which included representatives from medical oncology, radiation oncology, radiology, pathology, navigation, research.  Tina Carrillo currently presents as a current patient with history of the following treatments: surgical intervention(s).  Additionally, we reviewed previous medical and familial history, history of present illness, and recent lab results along with all available histopathologic and imaging studies. The tumor board considered available treatment options and made the following recommendations: Adjuvant chemotherapy, Adjuvant radiation    The following procedures/referrals were also placed: No orders of the defined types were placed in this encounter.   Clinical Trial Status: not discussed   Staging used: AJCC Staging: T: T1b N: N0        National site-specific guidelines   were discussed with respect to the case.  Tumor board is a meeting of clinicians from various specialty areas who evaluate and discuss patients for whom a multidisciplinary approach is being considered. Final determinations in the plan of care are those of the provider(s). The responsibility for follow up of recommendations given during tumor board is that of the provider.   Today's extended care, comprehensive team conference, Tina Carrillo was not present for the discussion and was not examined.   Multidisciplinary Tumor Board is a multidisciplinary case peer review process.  Decisions discussed in the Multidisciplinary Tumor Board reflect the opinions of the specialists present at the conference without having examined the patient.  Ultimately, treatment and diagnostic decisions rest with the primary provider(s) and the patient.

## 2024-08-03 ENCOUNTER — Inpatient Hospital Stay

## 2024-08-03 ENCOUNTER — Encounter: Payer: Self-pay | Admitting: Oncology

## 2024-08-03 ENCOUNTER — Inpatient Hospital Stay: Admitting: Oncology

## 2024-08-03 VITALS — BP 118/72 | HR 129 | Temp 97.0°F | Resp 18 | Ht 59.0 in | Wt 181.0 lb

## 2024-08-03 DIAGNOSIS — C50911 Malignant neoplasm of unspecified site of right female breast: Secondary | ICD-10-CM | POA: Diagnosis not present

## 2024-08-03 DIAGNOSIS — C50411 Malignant neoplasm of upper-outer quadrant of right female breast: Secondary | ICD-10-CM | POA: Diagnosis not present

## 2024-08-03 NOTE — Progress Notes (Signed)
 [facility] CSW Progress Note  Visual Merchandiser met with patient to follow-up on emotional support.    Interventions: Provided brief mental health counseling with regard to adjusting to her illness. Patient denies depression at this time.      Follow Up Plan:  Patient will contact CSW with any support or resource needs    Macario CHRISTELLA Au, LCSW Clinical Social Worker [facility]

## 2024-08-03 NOTE — Progress Notes (Signed)
 START ON PATHWAY REGIMEN - Breast     Cycles 1 through 4: A cycle is every 14 days:     Doxorubicin      Cyclophosphamide      Pegfilgrastim-xxxx    Cycles 5 through 16: A cycle is every 7 days:     Paclitaxel   **Always confirm dose/schedule in your pharmacy ordering system**  Patient Characteristics: Postoperative without Neoadjuvant Therapy, M0 (Pathologic Staging), Invasive Disease, Adjuvant Therapy, HER2 Negative, ER Negative, Node Negative, pT1a-b, N0, Chemotherapy Indicated Therapeutic Status: Postoperative without Neoadjuvant Therapy, M0 (Pathologic Staging) AJCC T Category: pT1b AJCC N Category: pN0 AJCC M Category: cM0 AJCC Grade: G3 ER Status: Negative (-) PR Status: Negative (-) HER2 Status: Negative (-) Oncotype Dx Recurrence Score: Not Appropriate AJCC 8 Stage Grouping: IB Intervention Indicated: Chemotherapy Intent of Therapy: Curative Intent, Discussed with Patient

## 2024-08-03 NOTE — Progress Notes (Unsigned)
 [facility]  [phone] [phone]  ID: Tina Carrillo OB: October 17, 1970  MR#: [id number]  RDW#:[id number]  [name], NP as PCP - General (Family Medicine) Georgina Shasta POUR, RN as Oncology Nurse Navigator [name], [name], MD as Consulting Physician (Oncology)  CHIEF COMPLAINT: Pathologic stage Ib triple negative invasive carcinoma of right breast.  INTERVAL HISTORY: Patient returns to clinic today for further evaluation, discussion of her final pathology results, and additional treatment planning.  Patient underwent lumpectomy on July 02, 2024 upgrading her malignancy from DCIS to the above-stated triple negative invasive carcinoma.  She continues to be anxious, but otherwise feels well.  She has no neurologic complaints.  She denies any recent fevers or illnesses.  She has a good appetite and denies weight loss.  She has no chest pain, shortness of breath, cough, or hemoptysis.  She denies any nausea, vomiting, constipation, or diarrhea.  She has no urinary complaints.  Patient offers no further specific complaints today.  REVIEW OF SYSTEMS:   Review of Systems  Constitutional: Negative.  Negative for fever, malaise/fatigue and weight loss.  Respiratory: Negative.  Negative for cough, hemoptysis and shortness of breath.   Cardiovascular: Negative.  Negative for chest pain and leg swelling.  Gastrointestinal: Negative.  Negative for abdominal pain.  Genitourinary: Negative.  Negative for dysuria.  Musculoskeletal: Negative.  Negative for back pain.  Skin: Negative.  Negative for rash.  Neurological: Negative.  Negative for dizziness, focal weakness, weakness and headaches.  Psychiatric/Behavioral:  The patient is nervous/anxious.     As per HPI. Otherwise, a complete review of systems is negative.  PAST MEDICAL HISTORY: Past Medical History:  Diagnosis Date  . Adrenal benign tumor   . Adrenal hyperplasia   .  Anxiety   . Diabetes mellitus without complication (HCC)   . Ductal carcinoma in situ (DCIS) of right breast   . GERD (gastroesophageal reflux disease)   . History of repair of congenital cleft palate   . Leukocytosis    a.) in setting of chronic corticosteroid use; b.) seen in consult by hematology 2019 --> flow cytometry revealed no immunophenotypic abnormality (no monocolon B cell population or neoplastic T cell process)  . PONV (postoperative nausea and vomiting)   . Sleep apnea    a.) unable to afford DME required for nocturnal PAP    PAST SURGICAL HISTORY: Past Surgical History:  Procedure Laterality Date  . AXILLARY SENTINEL NODE BIOPSY Right 07/02/2024   Procedure: BIOPSY, LYMPH NODE, SENTINEL, AXILLARY;  Surgeon: Tye Millet, DO;  Location: [facility];  Service: General;  Laterality: Right;  . BREAST BIOPSY Right 06/08/2024   US  RT BREAST BX W LOC DEV 1ST LESION IMG BX SPEC US  GUIDE 06/08/2024 [facility]  . BREAST BIOPSY Right 06/22/2024   US  RT BREAST SAVI/RF TAG 1ST LESION US  GUIDE 06/22/2024 [facility]  . BREAST LUMPECTOMY WITH RADIO FREQUENCY LOCALIZER Right 07/02/2024   Procedure: BREAST LUMPECTOMY WITH RADIO FREQUENCY LOCALIZER;  Surgeon: Tye Millet, DO;  Location: [facility];  Service: General;  Laterality: Right;  . CHOLECYSTECTOMY    . CLEFT PALATE REPAIR    . MYRINGOTOMY WITH TUBE PLACEMENT Bilateral 06/25/2023   Procedure: MYRINGOTOMY WITH TUBE PLACEMENT (BUTTERFLY TUBES);  Surgeon: [name], MD;  Location: [facility];  Service: ENT;  Laterality: Bilateral;  . removal of right adrenal gland  2024   done at [facility]  . VAGINA RECONSTRUCTION SURGERY  1985    FAMILY HISTORY: Family History  Problem Relation Age of Onset  . Ovarian cancer Mother   . Bipolar disorder Mother   . Diabetes Mother   . Uterine cancer Sister   . Breast cancer Sister 46       mat half sister  . Hypertension Father   . Heart disease Father   . Diabetes Maternal  Grandmother   . Brain cancer Paternal Grandmother     ADVANCED DIRECTIVES (Y/N):  N  HEALTH MAINTENANCE: Social History   Tobacco Use  . Smoking status: Former    Current packs/day: 0.00    Types: Cigarettes    Quit date: 2018    Years since quitting: 7.9  . Smokeless tobacco: Never  Vaping Use  . Vaping status: Never Used  Substance Use Topics  . Alcohol use: Yes    Alcohol/week: 1.0 standard drink of alcohol    Types: 1 Cans of beer per week    Comment: rarely  . Drug use: No     Colonoscopy:  PAP:  Bone density:  Lipid panel:  Allergies  Allergen Reactions  . Benzodiazepines Hives  . Tramadol  Nausea And Vomiting  . Morphine  And Codeine  Nausea And Vomiting  . Nucynta [Tapentadol] Nausea And Vomiting  . Oxycodone  Itching    Current Outpatient Medications  Medication Sig Dispense Refill  . acetaminophen  (TYLENOL ) 500 MG tablet Take 500-1,000 mg by mouth every 6 (six) hours as needed (pain.).    SABRA ALPRAZolam (XANAX) 0.5 MG tablet Take 0.25 mg by mouth at bedtime.    . BD INTEGRA SYRINGE 25G X 1 3 ML MISC 1 each by Other route.    . Blood Glucose Monitoring Suppl (FIFTY50 GLUCOSE METER 2.0) w/Device KIT Use as directed    . cyclobenzaprine  (FLEXERIL ) 10 MG tablet Take 0.5-1 tablets (5-10 mg total) by mouth 3 (three) times daily as needed. 30 tablet 0  . famotidine (PEPCID) 20 MG tablet Take 20 mg by mouth daily as needed for indigestion or heartburn.    SABRA FARXIGA 10 MG TABS tablet Take 10 mg by mouth daily.    . fludrocortisone (FLORINEF) 0.1 MG tablet Take 0.1 mg by mouth in the morning.    . fluticasone  (FLONASE ) 50 MCG/ACT nasal spray Place 1 spray into both nostrils daily as needed for allergies or rhinitis.    SABRA glucose blood (PRECISION QID TEST) test strip Use once daily Use as instructed.    . ibuprofen  (ADVIL ) 200 MG tablet Take 2 tablets (400 mg total) by mouth every 8 (eight) hours as needed (pain.). 30 tablet 0  . lidocaine -prilocaine  (EMLA ) cream Apply  1 Application topically once.    . ondansetron  (ZOFRAN ) 4 MG tablet Take 1 tablet (4 mg total) by mouth every 8 (eight) hours as needed for nausea or vomiting. 20 tablet 0  . Pediatric Multivit-Minerals (FLINTSTONES COMPLETE) CHEW Chew 1 tablet by mouth in the morning.    . predniSONE (DELTASONE) 10 MG tablet Take 5-10 mg by mouth See admin instructions. 5 at night 10 am    . promethazine  (PHENERGAN ) 12.5 MG tablet Take 12.5 mg by mouth every 8 (eight) hours as needed for vomiting or nausea.    . simvastatin (ZOCOR) 40 MG tablet Take 40 mg by mouth at bedtime.    . SitaGLIPtin-MetFORMIN HCl 50-1000 MG TB24 Take 1 tablet by mouth in the morning and at bedtime.    . SOLU-CORTEF  100 MG injection SMARTSIG:2 Milliliter(s) Once PRN     No current facility-administered medications for this visit.  OBJECTIVE: Vitals:   08/03/24 1119  BP: 118/72  Pulse: (!) 129  Resp: 18  Temp: (!) 97 F (36.1 C)  SpO2: 95%     Body mass index is 36.56 kg/m.    ECOG FS:0 - Asymptomatic  General: Well-developed, well-nourished, no acute distress. Eyes: Pink conjunctiva, anicteric sclera. HEENT: Normocephalic, moist mucous membranes. Lungs: No audible wheezing or coughing. Heart: Regular rate and rhythm. Abdomen: Soft, nontender, no obvious distention. Musculoskeletal: No edema, cyanosis, or clubbing. Neuro: Alert, answering all questions appropriately. Cranial nerves grossly intact. Skin: No rashes or petechiae noted. Psych: Normal affect.  LAB RESULTS:  Lab Results  Component Value Date   NA 135 06/28/2024   K 4.4 06/28/2024   CL 97 (L) 06/28/2024   CO2 26 06/28/2024   GLUCOSE 182 (H) 06/28/2024   BUN 22 (H) 06/28/2024   CREATININE 0.71 06/28/2024   CALCIUM 9.4 06/28/2024   PROT 7.8 05/19/2018   ALBUMIN 4.4 05/19/2018   AST 21 05/19/2018   ALT 28 05/19/2018   ALKPHOS 71 05/19/2018   BILITOT 0.4 05/19/2018   GFRNONAA >60 06/28/2024   GFRAA >60 09/08/2018    Lab Results  Component  Value Date   WBC 18.2 (H) 06/28/2024   NEUTROABS 11.6 (H) 09/08/2018   HGB 13.3 06/28/2024   HCT 41.4 06/28/2024   MCV 84.1 06/28/2024   PLT 306 06/28/2024     STUDIES: No results found.   ASSESSMENT: Pathologic stage Ib triple negative invasive carcinoma of right breast.  PLAN:    Pathologic stage Ib triple negative invasive carcinoma of right breast: Patient underwent lumpectomy on July 02, 2024 upgrading her malignancy to invasive triple negative carcinoma.  She does not require Oncotype testing.  We discussed at length the recommendation of adjuvant chemotherapy using Adriamycin, Cytoxan, and Taxol.  Given the small size of her malignancy, can also consider Taxotere and Cytoxan only.  Patient is unsure if she wishes to undergo chemotherapy at all and plans to further discuss her options with her family.  She plans to attend chemo education class for further information regarding her regimen.  Either way, patient will require adjuvant XRT.  An aromatase inhibitor would not be of benefit given the triple negative status of her malignancy.  Return to clinic in approximately 1 to 2 weeks for further evaluation and continued discussion of her treatment options.   Congenital adrenal hyperplasia/diabetes: Continue follow-up and treatment per endocrinology. Genetics: Patient was given a referral to genetic counseling.  I spent a total of 30 minutes reviewing chart data, face-to-face evaluation with the patient, counseling and coordination of care as detailed above.    Patient expressed understanding and was in agreement with this plan. She also understands that She can call clinic at any time with any questions, concerns, or complaints.    Cancer Staging  Invasive ductal carcinoma of right breast [facility]) Staging form: Breast, AJCC 8th Edition - Pathologic stage from 07/22/2024: Stage IB (pT1b, pN0, cM0, G3, ER-, PR-, HER2-) - Signed by [name] [name], MD on 07/22/2024 Stage prefix:  Initial diagnosis Histologic grading system: 3 grade system   [name] [name], MD   08/03/2024 11:44 AM

## 2024-08-03 NOTE — Progress Notes (Unsigned)
 Patient would like to discuss the treatment plan and schedule.   Echocardiogram is scheduled for tomorrow.

## 2024-08-04 ENCOUNTER — Ambulatory Visit: Admission: RE | Admit: 2024-08-04 | Discharge: 2024-08-04 | Attending: Oncology | Admitting: Oncology

## 2024-08-04 ENCOUNTER — Encounter: Payer: Self-pay | Admitting: Oncology

## 2024-08-04 ENCOUNTER — Telehealth: Payer: Self-pay

## 2024-08-04 ENCOUNTER — Other Ambulatory Visit: Payer: Self-pay

## 2024-08-04 ENCOUNTER — Ambulatory Visit

## 2024-08-04 DIAGNOSIS — Z0189 Encounter for other specified special examinations: Secondary | ICD-10-CM | POA: Diagnosis not present

## 2024-08-04 DIAGNOSIS — Z01810 Encounter for preprocedural cardiovascular examination: Secondary | ICD-10-CM | POA: Insufficient documentation

## 2024-08-04 DIAGNOSIS — E119 Type 2 diabetes mellitus without complications: Secondary | ICD-10-CM | POA: Diagnosis not present

## 2024-08-04 DIAGNOSIS — Z01818 Encounter for other preprocedural examination: Secondary | ICD-10-CM | POA: Diagnosis not present

## 2024-08-04 LAB — ECHOCARDIOGRAM COMPLETE
AR max vel: 3.41 cm2
AV Area VTI: 3.34 cm2
AV Area mean vel: 3.21 cm2
AV Mean grad: 2 mmHg
AV Peak grad: 4.2 mmHg
Ao pk vel: 1.03 m/s
Area-P 1/2: 5.02 cm2
S' Lateral: 2.9 cm

## 2024-08-04 MED ORDER — PROCHLORPERAZINE MALEATE 10 MG PO TABS: 10.0000 mg | ORAL_TABLET | Freq: Four times a day (QID) | ORAL | 2 refills | Status: DC | PRN

## 2024-08-04 MED ORDER — ONDANSETRON HCL 8 MG PO TABS: ORAL_TABLET | ORAL | 2 refills | Status: DC

## 2024-08-04 MED ORDER — LIDOCAINE-PRILOCAINE 2.5-2.5 % EX CREA: TOPICAL_CREAM | CUTANEOUS | 3 refills | Status: DC

## 2024-08-04 NOTE — Telephone Encounter (Signed)
 Treatment plan has been finalized.  Will proceed with short term disability request.

## 2024-08-06 ENCOUNTER — Ambulatory Visit: Payer: Self-pay | Admitting: Surgery

## 2024-08-06 ENCOUNTER — Encounter: Payer: Self-pay | Admitting: Oncology

## 2024-08-06 NOTE — H&P (Signed)
 Subjective:   CC: Ductal carcinoma in situ (DCIS) of right breast [D05.11]  HPI: Returns for evaluation of above. Proceeding with port placement to start chemo. Also mentions how she still has not returned to work due to drainage issues from her wound.  History of Present Illness   Past Medical History: has a past medical history of Adrenal adenoma (2017), Congenital adrenal hyperplasia (HHS-HCC), MASLD, Post-menopausal, and Type 2 diabetes mellitus (CMS/HHS-HCC).  Past Surgical History: has a past surgical history that includes vaginal reconstruction; Cleft palate repair; BILATERAL TUBES IN EARS; Cholecystectomy; and mastectomy partial (Right, 07/02/2024).  Family History: family history includes Bipolar disorder in her mother; Colon cancer in her maternal grandmother; Colon polyps in her father; Diabetes in her maternal grandmother; High blood pressure (Hypertension) in her sister; Irregular Heart Beat (Arrhythmia) in her father; Other in her mother; Ovarian cancer in her sister.  Social History: reports that she has quit smoking. She has never used smokeless tobacco. She reports current alcohol use. She reports that she does not use drugs.  Current Medications: has a current medication list which includes the following prescription(s): acetaminophen , alprazolam, cyclobenzaprine , fludrocortisone, fluticasone  propionate, janumet xr, lidocaine -prilocaine , multivitamin, pediatric multivit-iron-min, prednisone, simvastatin, solu-cortef  act-o-vial (pf), syringe with needle, safety, dapagliflozin propanediol, hydrocodone -acetaminophen , and ondansetron .  Allergies:  Allergies  Allergen Reactions  Tramadol  Nausea And Vomiting  Morphine  Vomiting and Nausea And Vomiting  Oxycodone  Itching  Tapentadol Nausea And Vomiting   ROS:  A 15 point review of systems was performed and pertinent positives and negatives noted in HPI  Objective:    BP 136/84  Pulse (!) 130  Ht 149.9 cm (4' 11)  Wt 83  kg (183 lb)  BMI 36.96 kg/m   Constitutional : No distress, cooperative, alert  Lymphatics/Throat: Supple with no lymphadenopathy  Respiratory: Clear to auscultation bilaterally  Cardiovascular: Regular rate and rhythm  Gastrointestinal: Soft, non-tender, non-distended, no organomegaly.  Musculoskeletal: Steady gait and movement  Skin: Cool and moist  Psychiatric: Normal affect, non-agitated, not confused  Breast: Chaperone present for exam. Lumpectomy healing well except for 5mm area of scab noted, with no surrounding skin changes. Pinpoint serosanguinous drainage noted on bra over area.    LABS:  N/a   RADS: N/a  Assessment:    Ductal carcinoma in situ (DCIS) of right breast [D05.11]  Plan:    1. Ductal carcinoma in situ (DCIS) of right breast [D05.11] Discussed surgical excision. Alternatives include continued observation. Benefits include possible symptom relief, pathologic evaluation, improved cosmesis. Discussed the risk of surgery including recurrence, chronic pain, post-op infxn, poor cosmesis, poor/delayed wound healing, and possible re-operation to address said risks. The risks of general anesthetic, if used, includes MI, CVA, sudden death or even reaction to anesthetic medications also discussed.  Typical post-op recovery time of 3-5 days with possible activity restrictions were also discussed.  The patient verbalized understanding and all questions were answered to the patient's satisfaction.  2. Patient has elected to proceed with surgical treatment. Procedure will be scheduled pending no issues with echocardiogram that is scheduled for jan 8th. Pt will call office with results once available, and then we can schedule port placement.  Will also update her disability paperwork so she is covered for additional days she missed from her lumpectomy due to the persistent leakage from incisional wound since previous post op visit. Will extend another 4weeks from today to  ensure wound heals completely. Last paperwork approved leave from 07/02/2024-11/20/205  labs/images/medications/previous chart entries reviewed personally and relevant changes/updates noted  above.

## 2024-08-06 NOTE — H&P (View-Only) (Signed)
 Subjective:   CC: Ductal carcinoma in situ (DCIS) of right breast [D05.11]  HPI: Returns for evaluation of above. Proceeding with port placement to start chemo. Also mentions how she still has not returned to work due to drainage issues from her wound.  History of Present Illness   Past Medical History: has a past medical history of Adrenal adenoma (2017), Congenital adrenal hyperplasia (HHS-HCC), MASLD, Post-menopausal, and Type 2 diabetes mellitus (CMS/HHS-HCC).  Past Surgical History: has a past surgical history that includes vaginal reconstruction; Cleft palate repair; BILATERAL TUBES IN EARS; Cholecystectomy; and mastectomy partial (Right, 07/02/2024).  Family History: family history includes Bipolar disorder in her mother; Colon cancer in her maternal grandmother; Colon polyps in her father; Diabetes in her maternal grandmother; High blood pressure (Hypertension) in her sister; Irregular Heart Beat (Arrhythmia) in her father; Other in her mother; Ovarian cancer in her sister.  Social History: reports that she has quit smoking. She has never used smokeless tobacco. She reports current alcohol use. She reports that she does not use drugs.  Current Medications: has a current medication list which includes the following prescription(s): acetaminophen , alprazolam, cyclobenzaprine , fludrocortisone, fluticasone  propionate, janumet xr, lidocaine -prilocaine , multivitamin, pediatric multivit-iron-min, prednisone, simvastatin, solu-cortef  act-o-vial (pf), syringe with needle, safety, dapagliflozin propanediol, hydrocodone -acetaminophen , and ondansetron .  Allergies:  Allergies  Allergen Reactions  Tramadol  Nausea And Vomiting  Morphine  Vomiting and Nausea And Vomiting  Oxycodone  Itching  Tapentadol Nausea And Vomiting   ROS:  A 15 point review of systems was performed and pertinent positives and negatives noted in HPI  Objective:    BP 136/84  Pulse (!) 130  Ht 149.9 cm (4' 11)  Wt 83  kg (183 lb)  BMI 36.96 kg/m   Constitutional : No distress, cooperative, alert  Lymphatics/Throat: Supple with no lymphadenopathy  Respiratory: Clear to auscultation bilaterally  Cardiovascular: Regular rate and rhythm  Gastrointestinal: Soft, non-tender, non-distended, no organomegaly.  Musculoskeletal: Steady gait and movement  Skin: Cool and moist  Psychiatric: Normal affect, non-agitated, not confused  Breast: Chaperone present for exam. Lumpectomy healing well except for 5mm area of scab noted, with no surrounding skin changes. Pinpoint serosanguinous drainage noted on bra over area.    LABS:  N/a   RADS: N/a  Assessment:    Ductal carcinoma in situ (DCIS) of right breast [D05.11]  Plan:    1. Ductal carcinoma in situ (DCIS) of right breast [D05.11] Discussed surgical excision. Alternatives include continued observation. Benefits include possible symptom relief, pathologic evaluation, improved cosmesis. Discussed the risk of surgery including recurrence, chronic pain, post-op infxn, poor cosmesis, poor/delayed wound healing, and possible re-operation to address said risks. The risks of general anesthetic, if used, includes MI, CVA, sudden death or even reaction to anesthetic medications also discussed.  Typical post-op recovery time of 3-5 days with possible activity restrictions were also discussed.  The patient verbalized understanding and all questions were answered to the patient's satisfaction.  2. Patient has elected to proceed with surgical treatment. Procedure will be scheduled pending no issues with echocardiogram that is scheduled for jan 8th. Pt will call office with results once available, and then we can schedule port placement.  Will also update her disability paperwork so she is covered for additional days she missed from her lumpectomy due to the persistent leakage from incisional wound since previous post op visit. Will extend another 4weeks from today to  ensure wound heals completely. Last paperwork approved leave from 07/02/2024-11/20/205  labs/images/medications/previous chart entries reviewed personally and relevant changes/updates noted  above.

## 2024-08-11 ENCOUNTER — Encounter
Admission: RE | Admit: 2024-08-11 | Discharge: 2024-08-11 | Disposition: A | Discharge status: Home / Self Care | Source: Ambulatory Visit | Attending: Surgery | Admitting: Surgery

## 2024-08-11 ENCOUNTER — Other Ambulatory Visit: Payer: Self-pay

## 2024-08-11 VITALS — Ht 59.5 in | Wt 180.0 lb

## 2024-08-11 DIAGNOSIS — E119 Type 2 diabetes mellitus without complications: Secondary | ICD-10-CM

## 2024-08-11 NOTE — Patient Instructions (Addendum)
 Your procedure is scheduled on: Tuesday 08/17/24 To find out your arrival time, please call [phone] between 1PM - 3PM on: Monday 08/16/24 Report to the Registration Desk on the 1st floor of the [facility]. If your arrival time is 6:00 am, do not arrive before that time as the [facility] entrance doors do not open until 6:00 am.  REMEMBER: Instructions that are not followed completely may result in serious medical risk, up to and including death; or upon the discretion of your surgeon and anesthesiologist your surgery may need to be rescheduled.  Do not eat food after midnight the night before surgery.  No gum chewing or hard candies.  You may however, drink CLEAR liquids up to 2 hours before you are scheduled to arrive for your surgery. Do not drink anything within 2 hours of your scheduled arrival time.  Clear liquids include: - water  only  One week prior to surgery: Stop Anti-inflammatories (NSAIDS) such as Advil , Aleve , Ibuprofen , Motrin , Naproxen , Naprosyn  and Aspirin based products such as Excedrin, Goody's Powder, BC Powder.  You may however, continue to take Tylenol  if needed for pain up until the day of surgery.  Stop ANY OVER THE COUNTER supplements and vitamins for at least 7 days until after surgery.  **Follow guidelines for insulin  and diabetes medications.**  **Follow recommendations regarding stopping blood thinners.**  Continue taking all of your other prescription medications up until the day of surgery.  ON THE DAY OF SURGERY ONLY TAKE THESE MEDICATIONS WITH SIPS OF WATER :    Use inhalers on the day of surgery and bring to the hospital.  Fleets enema or bowel prep as directed.  No Alcohol for 24 hours before or after surgery.  No Smoking including e-cigarettes for 24 hours before surgery.  No chewable tobacco products for at least 6 hours before surgery.  No nicotine patches on the day of surgery.  Do not use any recreational drugs for at least  a week (preferably 2 weeks) before your surgery.  Please be advised that the combination of cocaine and anesthesia may have negative outcomes, up to and including death. If you test positive for cocaine, your surgery will be cancelled.  On the morning of surgery brush your teeth with toothpaste and water , you may rinse your mouth with mouthwash if you wish. Do not swallow any toothpaste or mouthwash.  Use CHG Soap or wipes as directed on instruction sheet. (You can pick this up at our office in the [facility], the building to the left of the [employer], [address] at [address]  Do not shave body hair from the neck down 48 hours before surgery.  Do not wear lotions, powders, or perfumes.   Wear comfortable clothing (specific to your surgery type) to the hospital.  Do not wear jewelry, make-up, hairpins, clips or nail polish.  For welded (permanent) jewelry: bracelets, anklets, waist bands, etc.  Please have this removed prior to surgery.  If it is not removed, there is a chance that hospital personnel will need to cut it off on the day of surgery.  Contact lenses, hearing aids and dentures may not be worn into surgery.  Do not bring valuables to the hospital. [facility] is not responsible for any missing/lost belongings or valuables.   Notify your doctor if there is any change in your medical condition (cold, fever, infection).  After surgery, you can help prevent lung complications by doing breathing exercises.  Take deep  breaths and cough every 1-2 hours. Your doctor may order a device called an Incentive Spirometer to help you take deep breaths.  When coughing or sneezing, hold a pillow firmly against your incision with both hands. This is called splinting. Doing this helps protect your incision. It also decreases belly discomfort.  If you are being discharged the day of surgery, you will not be allowed to drive home. You will need a responsible  individual to drive you home and stay with you for 24 hours after surgery.   If you are taking public transportation, you will need to have a responsible individual with you.  Please call the Pre-admissions Testing Dept. at [phone] if you have any questions about these instructions.  Surgery Visitation Policy:  Patients having surgery or a procedure may have two visitors.  See [facility] website for visitor restrictions.   Merchandiser, Retail to address health-related social needs:  [email or web address]                                                                                                             Preparing for Surgery with CHLORHEXIDINE  GLUCONATE (CHG) Soap  Chlorhexidine  Gluconate (CHG) Soap  o An antiseptic cleaner that kills germs and bonds with the skin to continue killing germs even after washing  o Used for showering the night before surgery and morning of surgery  Before surgery, you can play an important role by reducing the number of germs on your skin.  CHG (Chlorhexidine  gluconate) soap is an antiseptic cleanser which kills germs and bonds with the skin to continue killing germs even after washing.  Please do not use if you have an allergy to CHG or antibacterial soaps. If your skin becomes reddened/irritated stop using the CHG.  1. Shower the NIGHT BEFORE SURGERY with CHG soap.  2. If you choose to wash your hair, wash your hair first as usual with your normal shampoo.  3. After shampooing, rinse your hair and body thoroughly to remove the shampoo.  4. Use CHG as you would any other liquid soap. You can apply CHG directly to the skin and wash gently with a clean washcloth.  5. Apply the CHG soap to your body only from the neck down. Do not use on open wounds or open sores. Avoid contact with your eyes, ears, mouth, and genitals (private parts). Wash face and genitals (private parts) with your normal soap.  6. Wash thoroughly,  paying special attention to the area where your surgery will be performed.  7. Thoroughly rinse your body with warm water .  8. Do not shower/wash with your normal soap after using and rinsing off the CHG soap.  9. Do not use lotions, oils, etc., after showering with CHG.  10. Pat yourself dry with a clean towel.  11. Wear clean pajamas to bed the night before surgery.  12. Place clean sheets on your bed the night of your shower and do not sleep with pets.  13. Do not apply any deodorants/lotions/powders.  14. Please wear clean clothes  to the hospital.  15. Remember to brush your teeth with your regular toothpaste.

## 2024-08-12 ENCOUNTER — Encounter: Payer: Self-pay | Admitting: Oncology

## 2024-08-13 ENCOUNTER — Encounter: Payer: Self-pay | Admitting: Oncology

## 2024-08-15 ENCOUNTER — Other Ambulatory Visit: Payer: Self-pay

## 2024-08-16 ENCOUNTER — Encounter: Payer: Self-pay | Admitting: *Deleted

## 2024-08-16 ENCOUNTER — Encounter: Payer: Self-pay | Admitting: Oncology

## 2024-08-16 ENCOUNTER — Other Ambulatory Visit: Payer: Self-pay | Admitting: Oncology

## 2024-08-16 DIAGNOSIS — C50911 Malignant neoplasm of unspecified site of right female breast: Secondary | ICD-10-CM

## 2024-08-16 NOTE — Progress Notes (Signed)
 Called Tina Carrillo to discuss what date she prefers to begin chemotherapy.  She would like her treatments on Mondays and would like to begin on 1/5.  Message sent to team about date preference.

## 2024-08-16 NOTE — Progress Notes (Signed)
 Pharmacist Chemotherapy Monitoring - Initial Assessment    Anticipated start date: 08/30/24   The following has been reviewed per standard work regarding the patient's treatment regimen: The patient's diagnosis, treatment plan and drug doses, and organ/hematologic function Lab orders and baseline tests specific to treatment regimen  The treatment plan start date, drug sequencing, and pre-medications Prior authorization status  Patient's documented medication list, including drug-drug interaction screen and prescriptions for anti-emetics and supportive care specific to the treatment regimen The drug concentrations, fluid compatibility, administration routes, and timing of the medications to be used The patient's access for treatment and lifetime cumulative dose history, if applicable  The patient's medication allergies and previous infusion related reactions, if applicable   Changes made to treatment plan:  N/A Baseline EF 60-65% 08/04/24  Follow up needed:  N/A and   Port placement planned 12/23  Tina Carrillo Andreas, [facility], 08/16/2024  2:41 PM

## 2024-08-16 NOTE — Progress Notes (Signed)
 Chemotherapy is scheduled for 08/30/24, appt details given to Ms. Tina Carrillo.

## 2024-08-17 ENCOUNTER — Other Ambulatory Visit: Payer: Self-pay

## 2024-08-17 ENCOUNTER — Encounter: Payer: Self-pay | Admitting: Oncology

## 2024-08-17 ENCOUNTER — Ambulatory Visit

## 2024-08-17 ENCOUNTER — Ambulatory Visit: Admitting: Anesthesiology

## 2024-08-17 ENCOUNTER — Ambulatory Visit
Admission: RE | Admit: 2024-08-17 | Discharge: 2024-08-17 | Disposition: A | Discharge status: Home / Self Care | Attending: Surgery | Admitting: Surgery

## 2024-08-17 ENCOUNTER — Encounter: Admission: RE | Disposition: A | Payer: Self-pay | Attending: Surgery

## 2024-08-17 ENCOUNTER — Encounter: Payer: Self-pay | Admitting: Surgery

## 2024-08-17 DIAGNOSIS — Z87891 Personal history of nicotine dependence: Secondary | ICD-10-CM | POA: Insufficient documentation

## 2024-08-17 DIAGNOSIS — D0511 Intraductal carcinoma in situ of right breast: Secondary | ICD-10-CM | POA: Diagnosis not present

## 2024-08-17 DIAGNOSIS — G4730 Sleep apnea, unspecified: Secondary | ICD-10-CM | POA: Insufficient documentation

## 2024-08-17 DIAGNOSIS — F419 Anxiety disorder, unspecified: Secondary | ICD-10-CM | POA: Insufficient documentation

## 2024-08-17 DIAGNOSIS — K219 Gastro-esophageal reflux disease without esophagitis: Secondary | ICD-10-CM | POA: Diagnosis not present

## 2024-08-17 DIAGNOSIS — Z7984 Long term (current) use of oral hypoglycemic drugs: Secondary | ICD-10-CM | POA: Diagnosis not present

## 2024-08-17 DIAGNOSIS — E119 Type 2 diabetes mellitus without complications: Secondary | ICD-10-CM | POA: Diagnosis not present

## 2024-08-17 DIAGNOSIS — Z01818 Encounter for other preprocedural examination: Secondary | ICD-10-CM | POA: Diagnosis not present

## 2024-08-17 DIAGNOSIS — Z833 Family history of diabetes mellitus: Secondary | ICD-10-CM | POA: Insufficient documentation

## 2024-08-17 DIAGNOSIS — J9811 Atelectasis: Secondary | ICD-10-CM | POA: Diagnosis not present

## 2024-08-17 HISTORY — PX: PORTACATH PLACEMENT: SHX2246

## 2024-08-17 LAB — GLUCOSE, CAPILLARY
Glucose-Capillary: 194 mg/dL — ABNORMAL HIGH (ref 70–99)
Glucose-Capillary: 205 mg/dL — ABNORMAL HIGH (ref 70–99)

## 2024-08-17 SURGERY — INSERTION, TUNNELED CENTRAL VENOUS DEVICE, WITH PORT
Anesthesia: General | Site: Breast | Laterality: Left

## 2024-08-17 MED ORDER — ACETAMINOPHEN 10 MG/ML IV SOLN
INTRAVENOUS | Status: AC
  Filled 2024-08-17: qty 100

## 2024-08-17 MED ORDER — SODIUM CHLORIDE 0.9 % IV SOLN: INTRAVENOUS | Status: DC

## 2024-08-17 MED ORDER — PROPOFOL 1000 MG/100ML IV EMUL
INTRAVENOUS | Status: AC
  Filled 2024-08-17: qty 100

## 2024-08-17 MED ORDER — ACETAMINOPHEN 10 MG/ML IV SOLN
INTRAVENOUS | Status: DC | PRN
  Administered 2024-08-17: 1000 mg via INTRAVENOUS

## 2024-08-17 MED ORDER — PHENYLEPHRINE 80 MCG/ML (10ML) SYRINGE FOR IV PUSH (FOR BLOOD PRESSURE SUPPORT)
PREFILLED_SYRINGE | INTRAVENOUS | Status: DC | PRN
  Administered 2024-08-17 (×5): 40 ug via INTRAVENOUS

## 2024-08-17 MED ORDER — FENTANYL CITRATE (PF) 100 MCG/2ML IJ SOLN
25.0000 ug | INTRAMUSCULAR | Status: DC | PRN
  Administered 2024-08-17 (×4): 25 ug via INTRAVENOUS

## 2024-08-17 MED ORDER — OXYCODONE HCL 5 MG/5ML PO SOLN: 5.0000 mg | Freq: Once | ORAL | Status: AC | PRN

## 2024-08-17 MED ORDER — DEXAMETHASONE SOD PHOSPHATE PF 10 MG/ML IJ SOLN
INTRAMUSCULAR | Status: DC | PRN
  Administered 2024-08-17: 4 mg via INTRAVENOUS

## 2024-08-17 MED ORDER — HEPARIN SOD (PORK) LOCK FLUSH 100 UNIT/ML IV SOLN
INTRAVENOUS | Status: AC
  Filled 2024-08-17: qty 5

## 2024-08-17 MED ORDER — HYDROCODONE-ACETAMINOPHEN 5-325 MG PO TABS
1.0000 | ORAL_TABLET | Freq: Three times a day (TID) | ORAL | 0 refills | Status: AC | PRN
  Filled 2024-08-17: qty 15, 5d supply, fill #0

## 2024-08-17 MED ORDER — FENTANYL CITRATE (PF) 100 MCG/2ML IJ SOLN
INTRAMUSCULAR | Status: DC | PRN
  Administered 2024-08-17: 25 ug via INTRAVENOUS
  Administered 2024-08-17: 75 ug via INTRAVENOUS

## 2024-08-17 MED ORDER — BUPIVACAINE-EPINEPHRINE (PF) 0.5% -1:200000 IJ SOLN
INTRAMUSCULAR | Status: AC
  Filled 2024-08-17: qty 30

## 2024-08-17 MED ORDER — LIDOCAINE HCL (PF) 2 % IJ SOLN
INTRAMUSCULAR | Status: AC
  Filled 2024-08-17: qty 5

## 2024-08-17 MED ORDER — MIDAZOLAM HCL (PF) 2 MG/2ML IJ SOLN
INTRAMUSCULAR | Status: DC | PRN
  Administered 2024-08-17: 2 mg via INTRAVENOUS

## 2024-08-17 MED ORDER — DIPHENHYDRAMINE HCL 50 MG/ML IJ SOLN
INTRAMUSCULAR | Status: DC | PRN
  Administered 2024-08-17: 25 mg via INTRAVENOUS

## 2024-08-17 MED ORDER — ONDANSETRON HCL 4 MG/2ML IJ SOLN
INTRAMUSCULAR | Status: DC | PRN
  Administered 2024-08-17: 4 mg via INTRAVENOUS

## 2024-08-17 MED ORDER — CEFAZOLIN SODIUM-DEXTROSE 2-3 GM-%(50ML) IV SOLR
INTRAVENOUS | Status: DC | PRN
  Administered 2024-08-17: 2 g via INTRAVENOUS

## 2024-08-17 MED ORDER — PHENYLEPHRINE 80 MCG/ML (10ML) SYRINGE FOR IV PUSH (FOR BLOOD PRESSURE SUPPORT)
PREFILLED_SYRINGE | INTRAVENOUS | Status: AC
  Filled 2024-08-17: qty 10

## 2024-08-17 MED ORDER — CHLORHEXIDINE GLUCONATE CLOTH 2 % EX PADS: 6.0000 | MEDICATED_PAD | Freq: Once | CUTANEOUS | Status: DC

## 2024-08-17 MED ORDER — ORAL CARE MOUTH RINSE: 15.0000 mL | Freq: Once | OROMUCOSAL | Status: DC

## 2024-08-17 MED ORDER — CHLORHEXIDINE GLUCONATE 0.12 % MT SOLN
OROMUCOSAL | Status: AC
  Filled 2024-08-17: qty 15

## 2024-08-17 MED ORDER — ONDANSETRON HCL 4 MG/2ML IJ SOLN: 4.0000 mg | Freq: Once | INTRAMUSCULAR | Status: DC | PRN

## 2024-08-17 MED ORDER — DEXMEDETOMIDINE HCL IN NACL 80 MCG/20ML IV SOLN
INTRAVENOUS | Status: DC | PRN
  Administered 2024-08-17 (×3): 4 ug via INTRAVENOUS

## 2024-08-17 MED ORDER — OXYCODONE HCL 5 MG PO TABS
5.0000 mg | ORAL_TABLET | Freq: Once | ORAL | Status: AC | PRN
  Administered 2024-08-17: 5 mg via ORAL

## 2024-08-17 MED ORDER — FENTANYL CITRATE (PF) 100 MCG/2ML IJ SOLN
INTRAMUSCULAR | Status: AC
  Filled 2024-08-17: qty 2

## 2024-08-17 MED ORDER — OXYCODONE HCL 5 MG PO TABS
ORAL_TABLET | ORAL | Status: AC
  Filled 2024-08-17: qty 1

## 2024-08-17 MED ORDER — PROPOFOL 500 MG/50ML IV EMUL
INTRAVENOUS | Status: DC | PRN
  Administered 2024-08-17: 100 ug/kg/min via INTRAVENOUS

## 2024-08-17 MED ORDER — PROPOFOL 10 MG/ML IV BOLUS
INTRAVENOUS | Status: AC
  Filled 2024-08-17: qty 20

## 2024-08-17 MED ORDER — CEFAZOLIN SODIUM-DEXTROSE 2-4 GM/100ML-% IV SOLN: 2.0000 g | INTRAVENOUS | Status: DC

## 2024-08-17 MED ORDER — DIPHENHYDRAMINE HCL 50 MG/ML IJ SOLN
INTRAMUSCULAR | Status: AC
  Filled 2024-08-17: qty 1

## 2024-08-17 MED ORDER — ONDANSETRON HCL 4 MG/2ML IJ SOLN
INTRAMUSCULAR | Status: AC
  Filled 2024-08-17: qty 2

## 2024-08-17 MED ORDER — CEFAZOLIN SODIUM-DEXTROSE 2-4 GM/100ML-% IV SOLN
INTRAVENOUS | Status: AC
  Filled 2024-08-17: qty 100

## 2024-08-17 MED ORDER — HEPARIN SOD (PORK) LOCK FLUSH 100 UNIT/ML IV SOLN
INTRAVENOUS | Status: DC | PRN
  Administered 2024-08-17: 500 [IU] via INTRAVENOUS

## 2024-08-17 MED ORDER — LIDOCAINE HCL (CARDIAC) PF 100 MG/5ML IV SOSY
PREFILLED_SYRINGE | INTRAVENOUS | Status: DC | PRN
  Administered 2024-08-17: 100 mg via INTRAVENOUS

## 2024-08-17 MED ORDER — BUPIVACAINE-EPINEPHRINE (PF) 0.5% -1:200000 IJ SOLN
INTRAMUSCULAR | Status: DC | PRN
  Administered 2024-08-17: 6 mL via PERINEURAL

## 2024-08-17 MED ORDER — PROPOFOL 10 MG/ML IV BOLUS
INTRAVENOUS | Status: DC | PRN
  Administered 2024-08-17: 200 mg via INTRAVENOUS

## 2024-08-17 MED ORDER — MIDAZOLAM HCL 2 MG/2ML IJ SOLN
INTRAMUSCULAR | Status: AC
  Filled 2024-08-17: qty 2

## 2024-08-17 MED ORDER — CHLORHEXIDINE GLUCONATE 0.12 % MT SOLN: 15.0000 mL | Freq: Once | OROMUCOSAL | Status: DC

## 2024-08-17 SURGICAL SUPPLY — 25 items
BAG DECANTER FOR FLEXI CONT (MISCELLANEOUS) ×1 IMPLANT
BLADE SURG SZ11 CARB STEEL (BLADE) ×1 IMPLANT
CLAMP SUTURE YELLOW 5 PAIRS (MISCELLANEOUS) ×1 IMPLANT
COVER LIGHT HANDLE STERIS (MISCELLANEOUS) ×2 IMPLANT
DERMABOND ADVANCED .7 DNX12 (GAUZE/BANDAGES/DRESSINGS) ×1 IMPLANT
DRAPE C-ARM XRAY 36X54 (DRAPES) ×1 IMPLANT
DRSG TEGADERM 4X4.75 (GAUZE/BANDAGES/DRESSINGS) ×1 IMPLANT
ELECTRODE REM PT RTRN 9FT ADLT (ELECTROSURGICAL) ×1 IMPLANT
GAUZE 4X4 16PLY ~~LOC~~+RFID DBL (SPONGE) IMPLANT
GLOVE BIOGEL PI IND STRL 7.0 (GLOVE) ×1 IMPLANT
GLOVE SURG SYN 6.5 PF PI (GLOVE) ×3 IMPLANT
GOWN STRL REUS W/ TWL LRG LVL3 (GOWN DISPOSABLE) ×3 IMPLANT
IV 0.9% NACL 500 ML (IV SOLUTION) ×1 IMPLANT
KIT PORT INFUSION SMART 8FR (Port) ×1 IMPLANT
KIT TURNOVER KIT A (KITS) ×1 IMPLANT
LABEL OR SOLS (LABEL) ×1 IMPLANT
MANIFOLD NEPTUNE II (INSTRUMENTS) ×1 IMPLANT
PACK PORT-A-CATH (MISCELLANEOUS) ×1 IMPLANT
SOLN STERILE WATER 500 ML (IV SOLUTION) ×1 IMPLANT
SPIKE FLUID TRANSFER (MISCELLANEOUS) ×1 IMPLANT
SUT MNCRL AB 4-0 PS2 18 (SUTURE) ×1 IMPLANT
SUT PROLENE 2 0 SH DA (SUTURE) ×1 IMPLANT
SUT VIC AB 3-0 SH 27X BRD (SUTURE) ×1 IMPLANT
SYR 10ML LL (SYRINGE) ×1 IMPLANT
TRAP FLUID SMOKE EVACUATOR (MISCELLANEOUS) ×1 IMPLANT

## 2024-08-17 NOTE — Transfer of Care (Signed)
 Immediate Anesthesia Transfer of Care Note  Patient: Tina Carrillo  Procedure(s) Performed: INSERTION, TUNNELED CENTRAL VENOUS DEVICE, WITH PORT (Left: Breast)  Patient Location: PACU  Anesthesia Type:General  Level of Consciousness: drowsy  Airway & Oxygen Therapy: Patient Spontanous Breathing and Patient connected to face mask oxygen  Post-op Assessment: Report given to RN and Post -op Vital signs reviewed and stable  Post vital signs: Reviewed and stable  Last Vitals:  Vitals Value Taken Time  BP 131/83 08/17/24 14:36  Temp 36.4 C 08/17/24 14:36  Pulse 94 08/17/24 14:42  Resp 21 08/17/24 14:42  SpO2 97 % 08/17/24 14:42  Vitals shown include unfiled device data.  Last Pain:  Vitals:   08/17/24 1034  TempSrc: Temporal  PainSc: 0-No pain         Complications: No notable events documented.

## 2024-08-17 NOTE — Discharge Instructions (Signed)
 Port cath placement, Care After This sheet gives you information about how to care for yourself after your procedure. Your health care provider may also give you more specific instructions. If you have problems or questions, contact your health care provider. What can I expect after the procedure? After the procedure, it is common to have: Soreness. Bruising. Itching. Follow these instructions at home: site care Follow instructions from your health care provider about how to take care of your site. Make sure you: Wash your hands with soap and water  before and after you change your bandage (dressing). If soap and water  are not available, use hand sanitizer. Leave stitches (sutures), skin glue, or adhesive strips in place. These skin closures may need to stay in place for 2 weeks or longer. If adhesive strip edges start to loosen and curl up, you may trim the loose edges. Do not remove adhesive strips completely unless your health care provider tells you to do that. If the area bleeds or bruises, apply gentle pressure for 10 minutes. OK TO SHOWER IN 24HRS  Check your site every day for signs of infection. Check for: Redness, swelling, or pain. Fluid or blood. Warmth. Pus or a bad smell.  General instructions Rest and then return to your normal activities as told by your health care provider.  tylenol  and advil  as needed for discomfort.  Please alternate between the two every four hours as needed for pain.    Use narcotics, if prescribed, only when tylenol  and motrin  is not enough to control pain.  325-650mg  every 8hrs to max of 3000mg /24hrs (including the 325mg  in every norco dose) for the tylenol .    Advil  up to 400mg  per dose every 8hrs as needed for pain.   Keep all follow-up visits as told by your health care provider. This is important. Contact a health care provider if: You have redness, swelling, or pain around your site. You have fluid or blood coming from your site. Your site  feels warm to the touch. You have pus or a bad smell coming from your site. You have a fever. Your sutures, skin glue, or adhesive strips loosen or come off sooner than expected. Get help right away if: You have bleeding that does not stop with pressure or a dressing. Summary After the procedure, it is common to have some soreness, bruising, and itching at the site. Follow instructions from your health care provider about how to take care of your site. Check your site every day for signs of infection. Contact a health care provider if you have redness, swelling, or pain around your site, or your site feels warm to the touch. Keep all follow-up visits as told by your health care provider. This is important. This information is not intended to replace advice given to you by your health care provider. Make sure you discuss any questions you have with your health care provider. Document Released: 09/08/2015 Document Revised: 02/09/2018 Document Reviewed: 02/09/2018 Elsevier Interactive Patient Education  [employer].

## 2024-08-17 NOTE — Anesthesia Procedure Notes (Signed)
 Procedure Name: LMA Insertion Date/Time: 08/17/2024 1:01 PM  Performed by: Ersel Wadleigh A, CRNAPre-anesthesia Checklist: Patient identified, Emergency Drugs available, Suction available, Patient being monitored and Timeout performed Patient Re-evaluated:Patient Re-evaluated prior to induction Oxygen Delivery Method: Circle system utilized Preoxygenation: Pre-oxygenation with 100% oxygen Induction Type: IV induction Ventilation: Mask ventilation without difficulty LMA: LMA inserted LMA Size: 4.0 Number of attempts: 1 Placement Confirmation: CO2 detector, positive ETCO2 and breath sounds checked- equal and bilateral Dental Injury: Teeth and Oropharynx as per pre-operative assessment

## 2024-08-17 NOTE — Anesthesia Preprocedure Evaluation (Addendum)
 "                                  Anesthesia Evaluation  Patient identified by MRN, date of birth, ID band Patient awake    Reviewed: Allergy & Precautions, NPO status , Patient's Chart, lab work & pertinent test results  History of Anesthesia Complications (+) PONV and history of anesthetic complications  Airway Mallampati: II  TM Distance: >3 FB Neck ROM: Full    Dental  (+) Missing   Pulmonary neg pulmonary ROS, sleep apnea , former smoker   Pulmonary exam normal        Cardiovascular Exercise Tolerance: Good negative cardio ROS Normal cardiovascular exam Rhythm:Regular Rate:Tachycardia     Neuro/Psych   Anxiety     negative neurological ROS  negative psych ROS   GI/Hepatic negative GI ROS, Neg liver ROS,GERD  Medicated,,  Endo/Other  negative endocrine ROSdiabetes, Type 2, Oral Hypoglycemic Agents    Renal/GU negative Renal ROS  negative genitourinary   Musculoskeletal   Abdominal  (+) + obese  Peds negative pediatric ROS (+)  Hematology negative hematology ROS (+)   Anesthesia Other Findings Past Medical History: No date: Adrenal benign tumor No date: Adrenal hyperplasia No date: Anxiety No date: Diabetes mellitus without complication (HCC) No date: Ductal carcinoma in situ (DCIS) of right breast No date: GERD (gastroesophageal reflux disease) No date: History of repair of congenital cleft palate No date: Leukocytosis     Comment:  a.) in setting of chronic corticosteroid use; b.) seen               in consult by hematology 2019 --> flow cytometry revealed              no immunophenotypic abnormality (no monocolon B cell               population or neoplastic T cell process) No date: PONV (postoperative nausea and vomiting) No date: Sleep apnea     Comment:  a.) unable to afford DME required for nocturnal PAP  Past Surgical History: 07/02/2024: AXILLARY SENTINEL NODE BIOPSY; Right     Comment:  Procedure: BIOPSY, LYMPH NODE,  SENTINEL, AXILLARY;                Surgeon: Tye Millet, DO;  Location: [facility] ORS;  Service:              General;  Laterality: Right; 06/08/2024: BREAST BIOPSY; Right     Comment:  US  RT BREAST BX W LOC DEV 1ST LESION IMG BX SPEC US                GUIDE 06/08/2024 [facility] 06/22/2024: BREAST BIOPSY; Right     Comment:  US  RT BREAST SAVI/RF TAG 1ST LESION US  GUIDE 06/22/2024               [facility] 07/02/2024: BREAST LUMPECTOMY WITH RADIO FREQUENCY LOCALIZER; Right     Comment:  Procedure: BREAST LUMPECTOMY WITH RADIO FREQUENCY               LOCALIZER;  Surgeon: Tye Millet, DO;  Location: [facility]               ORS;  Service: General;  Laterality: Right; No date: CHOLECYSTECTOMY No date: CLEFT PALATE REPAIR 06/25/2023: MYRINGOTOMY WITH TUBE PLACEMENT; Bilateral     Comment:  Procedure: MYRINGOTOMY WITH TUBE PLACEMENT (BUTTERFLY  TUBES);  Surgeon: [name], MD;  Location:               [facility];  Service: ENT;  Laterality:               Bilateral; 2024: removal of right adrenal gland     Comment:  done at [facility]: VAGINA RECONSTRUCTION SURGERY     Reproductive/Obstetrics negative OB ROS                              Anesthesia Physical Anesthesia Plan  ASA: 3  Anesthesia Plan: General   Post-op Pain Management:    Induction: Intravenous  PONV Risk Score and Plan: Ondansetron , Dexamethasone , Midazolam  and Treatment may vary due to age or medical condition  Airway Management Planned: Oral ETT  Additional Equipment:   Intra-op Plan:   Post-operative Plan: Extubation in OR  Informed Consent: I have reviewed the patients History and Physical, chart, labs and discussed the procedure including the risks, benefits and alternatives for the proposed anesthesia with the patient or authorized representative who has indicated his/her understanding and acceptance.     Dental Advisory Given  Plan Discussed  with: CRNA  Anesthesia Plan Comments:          Anesthesia Quick Evaluation  "

## 2024-08-17 NOTE — Interval H&P Note (Signed)
 No change. OK to proceed.

## 2024-08-18 ENCOUNTER — Encounter: Payer: Self-pay | Admitting: Surgery

## 2024-08-18 ENCOUNTER — Other Ambulatory Visit: Payer: Self-pay

## 2024-08-18 NOTE — Op Note (Signed)
 --OP NOTE  DATE OF PROCEDURE: 08/18/2024   SURGEON: Tye  ANESTHESIA: LMA  PRE-OPERATIVE DIAGNOSIS: Right breast cancer requring port for chemotherapy   POST-OPERATIVE DIAGNOSIS: same  PROCEDURE(S):  1.) Percutaneous access of left IJ vein under ultrasound guidance   2.) Insertion of tunneled left IJ central venous catheter with subcutaneous port  INTRAOPERATIVE FINDINGS: Patent easily compressible left IJ vein with appropriate respiratory variations and well-secured tunneled central venous catheter with subcutaneous port at completion of the procedure, heplocked after confirming ease of draw and push  ESTIMATED BLOOD LOSS: Minimal (<20 mL)   SPECIMENS: None   IMPLANTS: 57F tunneled Bard PowerPort central venous catheter with subcutaneous port  DRAINS: None   COMPLICATIONS: None apparent   CONDITION AT COMPLETION: Hemodynamically stable, awake   DISPOSITION: PACU   INDICATION(S) FOR PROCEDURE:  Patient is a 53 y.o. female who presented with above diagnosis.  All risks, benefits, and alternatives to above elective procedures were discussed with the patient, who elected to proceed, and informed consent was accordingly obtained at that time.  DETAILS OF PROCEDURE:  Patient was brought to the operative suite and appropriately identified. In Trendelenburg position, left side prepped and draped in the usual sterile fashion.  Timeout performed.  Left subclavian vein access attempted using landmarks but proved unsuccessful after 3 tries.  No evidence of bubbling noted within syringe during the procedure and no bleeding noted.   At this point changed to left IJ.  Percutaneous venous access was obtained under ultrasound guidance using Seldinger technique, by which local anesthetic was injected over the area, and access needle was inserted under direct ultrasound visualization through which soft guidewire was advanced with no resistance, over which access needle was withdrawn. Guidewire  was secured, attention was directed to injection of local anesthetic along the planned tunnel site, 2-3 cm transverse left chest incision was made and confirmed to accommodate the subcutaneous port, and flushed catheter was tunneled retrograde from the port site over to the vein access site.   Insertion sheath was advanced over the guidewire.  Mild resistance noted during insertion of sheath, but eventually was able to hub completely down the skin.  Wire withdrawn along with the insertion sheath dilator with no resistance. The catheter was introduced through the sheath, but additional resistance.  C arm positioning and fluoroscopy noted a sharp angle from the IJ to the subclavian and the catheter not passing through this point.  A guidewire was introduced through the catheter and placed back in the atriocaval junction under fluoroscopy and catheter attempted to be advanced over guidewire directly, but this proved unsuccessful as well.   Decision made to pull the catheter and introducer sheath out and replace with a new introducer.  Bleeding was minimized in the area with gentle manual pressure around the insertion site during the exchange of the catheter and introducer sheath.  Old catheter was removed completely over guidewire, and a new catheter was again tunneled retrograde through the chest wall port placement site to the IJ access site.  Introducer sheath then replaced over guidewire under fluoroscopy, this time with no resistance whatsoever and able to be helped to the skin.  Guidewire removed and new catheter was introduced through the sheath with no issues with resistance and noted to go into the atriocaval junction point under fluoroscopy. tip left in the Atrio Caval junction under fluoro guidance while sheath was peeled away and catheter cut to appropriate length.  Catheter connected to port and placed within planned fixation site.  Fluro confirmed no kink within the entire length of the catheter at  this point.    Port fixed to the pocket on 1 side and around the catheter to avoid twisting using 3-0 Prolene. Port was confirmed to withdraw blood and flush easily with included Heuber needle, and port heplocked. Dermis at the subcutaneous pocket was re-approximated using buried interrupted 3-0 Vicryl suture, and 4-0 Monocryl suture was used to re-approximate skin at the insertion and subcutaneous port sites in running subcuticular fashion.  Incisions then dressed with dermabond. Patient was then awakened from anesthesia and transferred to PACU in stable condition.  US  images saved in paper chart and fluoro images saved in Epic.  CXR post op confirmed proper placement of port and no evidence of pneumothorax.

## 2024-08-20 ENCOUNTER — Encounter: Payer: Self-pay | Admitting: Oncology

## 2024-08-27 NOTE — Anesthesia Postprocedure Evaluation (Signed)
"   Anesthesia Post Note  Patient: Tina Carrillo  Procedure(s) Performed: INSERTION, TUNNELED CENTRAL VENOUS DEVICE, WITH PORT (Left: Breast)  Patient location during evaluation: PACU Anesthesia Type: General Level of consciousness: awake Pain management: satisfactory to patient Vital Signs Assessment: post-procedure vital signs reviewed and stable Respiratory status: spontaneous breathing Cardiovascular status: stable Anesthetic complications: no   No notable events documented.   Last Vitals:  Vitals:   08/17/24 1500 08/17/24 1518  BP: 131/85 122/87  Pulse: 86 86  Resp: 18 18  Temp: (!) 36.4 C 36.4 C  SpO2: 100% 99%    Last Pain:  Vitals:   08/17/24 1518  TempSrc: Temporal  PainSc: 0-No pain                 VAN STAVEREN,Christeen Lai      "

## 2024-08-30 ENCOUNTER — Inpatient Hospital Stay

## 2024-08-30 ENCOUNTER — Inpatient Hospital Stay: Admitting: Oncology

## 2024-08-30 ENCOUNTER — Ambulatory Visit: Payer: Self-pay | Admitting: Oncology

## 2024-08-30 ENCOUNTER — Encounter: Payer: Self-pay | Admitting: *Deleted

## 2024-08-30 ENCOUNTER — Telehealth: Payer: Self-pay | Admitting: *Deleted

## 2024-08-30 ENCOUNTER — Encounter: Payer: Self-pay | Admitting: Oncology

## 2024-08-30 ENCOUNTER — Inpatient Hospital Stay (HOSPITAL_BASED_OUTPATIENT_CLINIC_OR_DEPARTMENT_OTHER): Admitting: Oncology

## 2024-08-30 ENCOUNTER — Inpatient Hospital Stay: Attending: Oncology

## 2024-08-30 VITALS — BP 129/99 | HR 125 | Temp 97.3°F | Resp 18 | Ht 59.0 in | Wt 175.0 lb

## 2024-08-30 VITALS — BP 120/88 | HR 116

## 2024-08-30 DIAGNOSIS — D649 Anemia, unspecified: Secondary | ICD-10-CM | POA: Diagnosis not present

## 2024-08-30 DIAGNOSIS — Z803 Family history of malignant neoplasm of breast: Secondary | ICD-10-CM | POA: Insufficient documentation

## 2024-08-30 DIAGNOSIS — Z17421 Hormone receptor negative with human epidermal growth factor receptor 2 negative status: Secondary | ICD-10-CM | POA: Diagnosis not present

## 2024-08-30 DIAGNOSIS — Z5111 Encounter for antineoplastic chemotherapy: Secondary | ICD-10-CM | POA: Diagnosis present

## 2024-08-30 DIAGNOSIS — I700 Atherosclerosis of aorta: Secondary | ICD-10-CM | POA: Insufficient documentation

## 2024-08-30 DIAGNOSIS — Z8041 Family history of malignant neoplasm of ovary: Secondary | ICD-10-CM | POA: Diagnosis not present

## 2024-08-30 DIAGNOSIS — R000 Tachycardia, unspecified: Secondary | ICD-10-CM | POA: Insufficient documentation

## 2024-08-30 DIAGNOSIS — E876 Hypokalemia: Secondary | ICD-10-CM | POA: Diagnosis not present

## 2024-08-30 DIAGNOSIS — E871 Hypo-osmolality and hyponatremia: Secondary | ICD-10-CM | POA: Insufficient documentation

## 2024-08-30 DIAGNOSIS — Z808 Family history of malignant neoplasm of other organs or systems: Secondary | ICD-10-CM | POA: Insufficient documentation

## 2024-08-30 DIAGNOSIS — Z87730 Personal history of (corrected) cleft lip and palate: Secondary | ICD-10-CM | POA: Insufficient documentation

## 2024-08-30 DIAGNOSIS — Z886 Allergy status to analgesic agent status: Secondary | ICD-10-CM | POA: Diagnosis not present

## 2024-08-30 DIAGNOSIS — C50411 Malignant neoplasm of upper-outer quadrant of right female breast: Secondary | ICD-10-CM | POA: Diagnosis present

## 2024-08-30 DIAGNOSIS — D709 Neutropenia, unspecified: Secondary | ICD-10-CM | POA: Insufficient documentation

## 2024-08-30 DIAGNOSIS — Z833 Family history of diabetes mellitus: Secondary | ICD-10-CM | POA: Insufficient documentation

## 2024-08-30 DIAGNOSIS — Z79899 Other long term (current) drug therapy: Secondary | ICD-10-CM | POA: Insufficient documentation

## 2024-08-30 DIAGNOSIS — R1115 Cyclical vomiting syndrome unrelated to migraine: Secondary | ICD-10-CM | POA: Insufficient documentation

## 2024-08-30 DIAGNOSIS — D6959 Other secondary thrombocytopenia: Secondary | ICD-10-CM | POA: Insufficient documentation

## 2024-08-30 DIAGNOSIS — E1165 Type 2 diabetes mellitus with hyperglycemia: Secondary | ICD-10-CM | POA: Diagnosis not present

## 2024-08-30 DIAGNOSIS — Z87891 Personal history of nicotine dependence: Secondary | ICD-10-CM | POA: Diagnosis not present

## 2024-08-30 DIAGNOSIS — C50911 Malignant neoplasm of unspecified site of right female breast: Secondary | ICD-10-CM | POA: Diagnosis not present

## 2024-08-30 DIAGNOSIS — Z9049 Acquired absence of other specified parts of digestive tract: Secondary | ICD-10-CM | POA: Insufficient documentation

## 2024-08-30 DIAGNOSIS — R197 Diarrhea, unspecified: Secondary | ICD-10-CM | POA: Insufficient documentation

## 2024-08-30 DIAGNOSIS — Z885 Allergy status to narcotic agent status: Secondary | ICD-10-CM | POA: Diagnosis not present

## 2024-08-30 DIAGNOSIS — Z6833 Body mass index (BMI) 33.0-33.9, adult: Secondary | ICD-10-CM | POA: Diagnosis not present

## 2024-08-30 DIAGNOSIS — I517 Cardiomegaly: Secondary | ICD-10-CM | POA: Insufficient documentation

## 2024-08-30 DIAGNOSIS — E860 Dehydration: Secondary | ICD-10-CM | POA: Insufficient documentation

## 2024-08-30 DIAGNOSIS — Z818 Family history of other mental and behavioral disorders: Secondary | ICD-10-CM | POA: Insufficient documentation

## 2024-08-30 DIAGNOSIS — Z8249 Family history of ischemic heart disease and other diseases of the circulatory system: Secondary | ICD-10-CM | POA: Insufficient documentation

## 2024-08-30 DIAGNOSIS — Z8049 Family history of malignant neoplasm of other genital organs: Secondary | ICD-10-CM | POA: Insufficient documentation

## 2024-08-30 LAB — CBC WITH DIFFERENTIAL (CANCER CENTER ONLY)
Abs Immature Granulocytes: 0.78 K/uL — ABNORMAL HIGH (ref 0.00–0.07)
Basophils Absolute: 0.1 K/uL (ref 0.0–0.1)
Basophils Relative: 1 %
Eosinophils Absolute: 0.1 K/uL (ref 0.0–0.5)
Eosinophils Relative: 0 %
HCT: 45 % (ref 36.0–46.0)
Hemoglobin: 14.7 g/dL (ref 12.0–15.0)
Immature Granulocytes: 3 %
Lymphocytes Relative: 19 %
Lymphs Abs: 4.6 K/uL — ABNORMAL HIGH (ref 0.7–4.0)
MCH: 26.9 pg (ref 26.0–34.0)
MCHC: 32.7 g/dL (ref 30.0–36.0)
MCV: 82.4 fL (ref 80.0–100.0)
Monocytes Absolute: 1.5 K/uL — ABNORMAL HIGH (ref 0.1–1.0)
Monocytes Relative: 6 %
Neutro Abs: 17.3 K/uL — ABNORMAL HIGH (ref 1.7–7.7)
Neutrophils Relative %: 71 %
Platelet Count: 375 K/uL (ref 150–400)
RBC: 5.46 MIL/uL — ABNORMAL HIGH (ref 3.87–5.11)
RDW: 14.1 % (ref 11.5–15.5)
WBC Count: 24.3 K/uL — ABNORMAL HIGH (ref 4.0–10.5)
nRBC: 0 % (ref 0.0–0.2)

## 2024-08-30 LAB — CMP (CANCER CENTER ONLY)
ALT: 57 U/L — ABNORMAL HIGH (ref 0–44)
AST: 32 U/L (ref 15–41)
Albumin: 5 g/dL (ref 3.5–5.0)
Alkaline Phosphatase: 94 U/L (ref 38–126)
Anion gap: 19 — ABNORMAL HIGH (ref 5–15)
BUN: 21 mg/dL — ABNORMAL HIGH (ref 6–20)
CO2: 19 mmol/L — ABNORMAL LOW (ref 22–32)
Calcium: 10.5 mg/dL — ABNORMAL HIGH (ref 8.9–10.3)
Chloride: 90 mmol/L — ABNORMAL LOW (ref 98–111)
Creatinine: 0.77 mg/dL (ref 0.44–1.00)
GFR, Estimated: >60 mL/min
Glucose, Bld: 219 mg/dL — ABNORMAL HIGH (ref 70–99)
Potassium: 4.3 mmol/L (ref 3.5–5.1)
Sodium: 128 mmol/L — ABNORMAL LOW (ref 135–145)
Total Bilirubin: 0.5 mg/dL (ref 0.0–1.2)
Total Protein: 8.3 g/dL — ABNORMAL HIGH (ref 6.5–8.1)

## 2024-08-30 LAB — PREGNANCY, URINE: Preg Test, Ur: NEGATIVE

## 2024-08-30 MED ORDER — SODIUM CHLORIDE 0.9 % IV SOLN
INTRAVENOUS | Status: DC
  Filled 2024-08-30: qty 250

## 2024-08-30 MED ORDER — PALONOSETRON HCL INJECTION 0.25 MG/5ML
0.2500 mg | Freq: Once | INTRAVENOUS | Status: AC
  Administered 2024-08-30: 0.25 mg via INTRAVENOUS
  Filled 2024-08-30: qty 5

## 2024-08-30 MED ORDER — DOXORUBICIN HCL CHEMO IV INJECTION 2 MG/ML
60.0000 mg/m2 | Freq: Once | INTRAVENOUS | Status: AC
  Administered 2024-08-30: 112 mg via INTRAVENOUS
  Filled 2024-08-30: qty 56

## 2024-08-30 MED ORDER — SODIUM CHLORIDE 0.9 % IV SOLN
600.0000 mg/m2 | Freq: Once | INTRAVENOUS | Status: AC
  Administered 2024-08-30: 1120 mg via INTRAVENOUS
  Filled 2024-08-30: qty 56

## 2024-08-30 MED ORDER — DEXAMETHASONE SOD PHOSPHATE PF 10 MG/ML IJ SOLN
10.0000 mg | Freq: Once | INTRAMUSCULAR | Status: AC
  Administered 2024-08-30: 10 mg via INTRAVENOUS

## 2024-08-30 MED ORDER — SODIUM CHLORIDE 0.9 % IV SOLN
150.0000 mg | Freq: Once | INTRAVENOUS | Status: AC
  Administered 2024-08-30: 150 mg via INTRAVENOUS
  Filled 2024-08-30: qty 5

## 2024-08-30 MED ORDER — LORAZEPAM 2 MG/ML IJ SOLN
0.5000 mg | Freq: Once | INTRAMUSCULAR | Status: AC
  Administered 2024-08-30: 0.5 mg via INTRAVENOUS
  Filled 2024-08-30: qty 1

## 2024-08-30 NOTE — Progress Notes (Signed)
 Patient has some questions for the doctor today.

## 2024-08-30 NOTE — Patient Instructions (Signed)
 [facility] - A DEPT OF [facility]  Discharge Instructions: Thank you for choosing [facility] to provide your oncology and hematology care.  If you have a lab appointment with the Cancer Center, please go directly to the Cancer Center and check in at the registration area.  Wear comfortable clothing and clothing appropriate for easy access to any Portacath or PICC line.   We strive to give you quality time with your provider. You may need to reschedule your appointment if you arrive late (15 or more minutes).  Arriving late affects you and other patients whose appointments are after yours.  Also, if you miss three or more appointments without notifying the office, you may be dismissed from the clinic at the provider's discretion.      For prescription refill requests, have your pharmacy contact our office and allow 72 hours for refills to be completed.    Today you received the following chemotherapy and/or immunotherapy agents Adriamycin and Cytoxan       To help prevent nausea and vomiting after your treatment, we encourage you to take your nausea medication as directed.  BELOW ARE SYMPTOMS THAT SHOULD BE REPORTED IMMEDIATELY: *FEVER GREATER THAN 100.4 F (38 C) OR HIGHER *CHILLS OR SWEATING *NAUSEA AND VOMITING THAT IS NOT CONTROLLED WITH YOUR NAUSEA MEDICATION *UNUSUAL SHORTNESS OF BREATH *UNUSUAL BRUISING OR BLEEDING *URINARY PROBLEMS (pain or burning when urinating, or frequent urination) *BOWEL PROBLEMS (unusual diarrhea, constipation, pain near the anus) TENDERNESS IN MOUTH AND THROAT WITH OR WITHOUT PRESENCE OF ULCERS (sore throat, sores in mouth, or a toothache) UNUSUAL RASH, SWELLING OR PAIN  UNUSUAL VAGINAL DISCHARGE OR ITCHING   Items with * indicate a potential emergency and should be followed up as soon as possible or go to the Emergency Department if any problems should occur.  Please show the CHEMOTHERAPY ALERT CARD or  IMMUNOTHERAPY ALERT CARD at check-in to the Emergency Department and triage nurse.  Should you have questions after your visit or need to cancel or reschedule your appointment, please contact [facility] - A DEPT OF [facility]  [phone] and follow the prompts.  Office hours are 8:00 a.m. to 4:30 p.m. Monday - Friday. Please note that voicemails left after 4:00 p.m. may not be returned until the following business day.  We are closed weekends and major holidays. You have access to a nurse at all times for urgent questions. Please call the main number to the clinic [phone] and follow the prompts.  For any non-urgent questions, you may also contact your provider using MyChart. We now offer e-Visits for anyone 39 and older to request care online for non-urgent symptoms. For details visit [email or web address].   Also download the MyChart app! Go to the app store, search "MyChart", open the app, select [facility], and log in with your MyChart username and password.    Doxorubicin Injection What is this medication? DOXORUBICIN (dox oh ROO bi sin) treats some types of cancer. It works by slowing down the growth of cancer cells. This medicine may be used for other purposes; ask your health care provider or pharmacist if you have questions. COMMON BRAND NAME(S): Adriamycin, Adriamycin PFS, Adriamycin RDF, Rubex What should I tell my care team before I take this medication? They need to know if you have any of these conditions: Heart disease History of low blood cell levels caused by a medication Liver disease Recent  or ongoing radiation An unusual or allergic reaction to doxorubicin, other medications, foods, dyes, or preservatives If you or your partner are pregnant or trying to get pregnant Breast-feeding How should I use this medication? This medication is injected into a vein. It is given by your care team in a hospital or clinic setting. Talk to  your care team about the use of this medication in children. Special care may be needed. Overdosage: If you think you have taken too much of this medicine contact a poison control center or emergency room at once. NOTE: This medicine is only for you. Do not share this medicine with others. What if I miss a dose? Keep appointments for follow-up doses. It is important not to miss your dose. Call your care team if you are unable to keep an appointment. What may interact with this medication? 6-mercaptopurine Paclitaxel Phenytoin St. John's wort Trastuzumab Verapamil This list may not describe all possible interactions. Give your health care provider a list of all the medicines, herbs, non-prescription drugs, or dietary supplements you use. Also tell them if you smoke, drink alcohol, or use illegal drugs. Some items may interact with your medicine. What should I watch for while using this medication? Your condition will be monitored carefully while you are receiving this medication. You may need blood work while taking this medication. This medication may make you feel generally unwell. This is not uncommon as chemotherapy can affect healthy cells as well as cancer cells. Report any side effects. Continue your course of treatment even though you feel ill unless your care team tells you to stop. There is a maximum amount of this medication you should receive throughout your life. The amount depends on the medical condition being treated and your overall health. Your care team will watch how much of this medication you receive. Tell your care team if you have taken this medication before. Your urine may turn red for a few days after your dose. This is not blood. If your urine is dark or brown, call your care team. In some cases, you may be given additional medications to help with side effects. Follow all directions for their use. This medication may increase your risk of getting an infection. Call your  care team for advice if you get a fever, chills, sore throat, or other symptoms of a cold or flu. Do not treat yourself. Try to avoid being around people who are sick. This medication may increase your risk to bruise or bleed. Call your care team if you notice any unusual bleeding. Talk to your care team about your risk of cancer. You may be more at risk for certain types of cancers if you take this medication. Talk to your care team if you or your partner may be pregnant. Serious birth defects can occur if you take this medication during pregnancy and for 6 months after the last dose. Contraception is recommended while taking this medication and for 6 months after the last dose. Your care team can help you find the option that works for you. If your partner can get pregnant, use a condom while taking this medication and for 6 months after the last dose. Do not breastfeed while taking this medication. This medication may cause infertility. Talk to your care team if you are concerned about your fertility. What side effects may I notice from receiving this medication? Side effects that you should report to your care team as soon as possible: Allergic reactions--skin rash, itching, hives,  swelling of the face, lips, tongue, or throat Heart failure--shortness of breath, swelling of the ankles, feet, or hands, sudden weight gain, unusual weakness or fatigue Heart rhythm changes--fast or irregular heartbeat, dizziness, feeling faint or lightheaded, chest pain, trouble breathing Infection--fever, chills, cough, sore throat, wounds that don't heal, pain or trouble when passing urine, general feeling of discomfort or being unwell Low red blood cell level--unusual weakness or fatigue, dizziness, headache, trouble breathing Painful swelling, warmth, or redness of the skin, blisters or sores at the infusion site Unusual bruising or bleeding Side effects that usually do not require medical attention (report to  your care team if they continue or are bothersome): Diarrhea Hair loss Nausea Pain, redness, or swelling with sores inside the mouth or throat Red urine This list may not describe all possible side effects. Call your doctor for medical advice about side effects. You may report side effects to FDA at 1-800-FDA-1088. Where should I keep my medication? This medication is given in a hospital or clinic. It will not be stored at home. NOTE: This sheet is a summary. It may not cover all possible information. If you have questions about this medicine, talk to your doctor, pharmacist, or health care provider.  2024 Elsevier/Gold Standard (2022-11-14 00:00:00)    Cyclophosphamide Injection What is this medication? CYCLOPHOSPHAMIDE (sye kloe FOSS fa mide) treats some types of cancer. It works by slowing down the growth of cancer cells. This medicine may be used for other purposes; ask your health care provider or pharmacist if you have questions. COMMON BRAND NAME(S): Cyclophosphamide, Cytoxan, Neosar What should I tell my care team before I take this medication? They need to know if you have any of these conditions: Heart disease Irregular heartbeat or rhythm Infection Kidney problems Liver disease Low blood cell levels (white cells, platelets, or red blood cells) Lung disease Previous radiation Trouble passing urine An unusual or allergic reaction to cyclophosphamide, other medications, foods, dyes, or preservatives Pregnant or trying to get pregnant Breast-feeding How should I use this medication? This medication is injected into a vein. It is given by your care team in a hospital or clinic setting. Talk to your care team about the use of this medication in children. Special care may be needed. Overdosage: If you think you have taken too much of this medicine contact a poison control center or emergency room at once. NOTE: This medicine is only for you. Do not share this medicine with  others. What if I miss a dose? Keep appointments for follow-up doses. It is important not to miss your dose. Call your care team if you are unable to keep an appointment. What may interact with this medication? Amphotericin B Amiodarone Azathioprine Certain antivirals for HIV or hepatitis Certain medications for blood pressure, such as enalapril, lisinopril, quinapril Cyclosporine Diuretics Etanercept Indomethacin Medications that relax muscles Metronidazole Natalizumab Tamoxifen Warfarin This list may not describe all possible interactions. Give your health care provider a list of all the medicines, herbs, non-prescription drugs, or dietary supplements you use. Also tell them if you smoke, drink alcohol, or use illegal drugs. Some items may interact with your medicine. What should I watch for while using this medication? This medication may make you feel generally unwell. This is not uncommon as chemotherapy can affect healthy cells as well as cancer cells. Report any side effects. Continue your course of treatment even though you feel ill unless your care team tells you to stop. You may need blood work while you  are taking this medication. This medication may increase your risk of getting an infection. Call your care team for advice if you get a fever, chills, sore throat, or other symptoms of a cold or flu. Do not treat yourself. Try to avoid being around people who are sick. Avoid taking medications that contain aspirin, acetaminophen, ibuprofen, naproxen, or ketoprofen unless instructed by your care team. These medications may hide a fever. Be careful brushing or flossing your teeth or using a toothpick because you may get an infection or bleed more easily. If you have any dental work done, tell your dentist you are receiving this medication. Drink water or other fluids as directed. Urinate often, even at night. Some products may contain alcohol. Ask your care team if this medication  contains alcohol. Be sure to tell all care teams you are taking this medicine. Certain medicines, like metronidazole and disulfiram, can cause an unpleasant reaction when taken with alcohol. The reaction includes flushing, headache, nausea, vomiting, sweating, and increased thirst. The reaction can last from 30 minutes to several hours. Talk to your care team if you wish to become pregnant or think you might be pregnant. This medication can cause serious birth defects if taken during pregnancy and for 1 year after the last dose. A negative pregnancy test is required before starting this medication. A reliable form of contraception is recommended while taking this medication and for 1 year after the last dose. Talk to your care team about reliable forms of contraception. Do not father a child while taking this medication and for 4 months after the last dose. Use a condom during this time period. Do not breast-feed while taking this medication or for 1 week after the last dose. This medication may cause infertility. Talk to your care team if you are concerned about your fertility. Talk to your care team about your risk of cancer. You may be more at risk for certain types of cancer if you take this medication. What side effects may I notice from receiving this medication? Side effects that you should report to your care team as soon as possible: Allergic reactions--skin rash, itching, hives, swelling of the face, lips, tongue, or throat Dry cough, shortness of breath or trouble breathing Heart failure--shortness of breath, swelling of the ankles, feet, or hands, sudden weight gain, unusual weakness or fatigue Heart muscle inflammation--unusual weakness or fatigue, shortness of breath, chest pain, fast or irregular heartbeat, dizziness, swelling of the ankles, feet, or hands Heart rhythm changes--fast or irregular heartbeat, dizziness, feeling faint or lightheaded, chest pain, trouble  breathing Infection--fever, chills, cough, sore throat, wounds that don't heal, pain or trouble when passing urine, general feeling of discomfort or being unwell Kidney injury--decrease in the amount of urine, swelling of the ankles, hands, or feet Liver injury--right upper belly pain, loss of appetite, nausea, light-colored stool, dark yellow or brown urine, yellowing skin or eyes, unusual weakness or fatigue Low red blood cell level--unusual weakness or fatigue, dizziness, headache, trouble breathing Low sodium level--muscle weakness, fatigue, dizziness, headache, confusion Red or dark brown urine Unusual bruising or bleeding Side effects that usually do not require medical attention (report to your care team if they continue or are bothersome): Hair loss Irregular menstrual cycles or spotting Loss of appetite Nausea Pain, redness, or swelling with sores inside the mouth or throat Vomiting This list may not describe all possible side effects. Call your doctor for medical advice about side effects. You may report side effects to FDA at  1-800-FDA-1088. Where should I keep my medication? This medication is given in a hospital or clinic. It will not be stored at home. NOTE: This sheet is a summary. It may not cover all possible information. If you have questions about this medicine, talk to your doctor, pharmacist, or health care provider.  2024 Elsevier/Gold Standard (2021-12-28 00:00:00)

## 2024-08-30 NOTE — Telephone Encounter (Signed)
 Tina Carrillo, patient came in for her visit today. She is starting chemotherapy. She said that her short term disability company is requesting updates. She left the following information with me in clinic.  [employer] Ph: [phone] Claim # [id number]  Thanks so much!

## 2024-08-30 NOTE — Progress Notes (Signed)
 " [facility]  Telephone:([phone] [phone]  ID: Tina Carrillo OB: 05/30/1971  MR#: [id number]  RDW#:[id number]  [name], NP as PCP - General (Family Medicine) Georgina Shasta POUR, RN as Oncology Nurse Navigator [name], [name], MD as Consulting Physician (Oncology)  CHIEF COMPLAINT: Pathologic stage Ib triple negative invasive carcinoma of right breast.  INTERVAL HISTORY: Patient returns to clinic today for further evaluation and initiation of cycle 1 of 4 of Adriamycin  and Cytoxan .  She continues to be anxious, but otherwise feels well. She has no neurologic complaints.  She denies any recent fevers or illnesses.  She has a good appetite and denies weight loss.  She has no chest pain, shortness of breath, cough, or hemoptysis.  She denies any nausea, vomiting, constipation, or diarrhea.  She has no urinary complaints.  Patient offers no further specific complaints today.  REVIEW OF SYSTEMS:   Review of Systems  Constitutional: Negative.  Negative for fever, malaise/fatigue and weight loss.  Respiratory: Negative.  Negative for cough, hemoptysis and shortness of breath.   Cardiovascular: Negative.  Negative for chest pain and leg swelling.  Gastrointestinal: Negative.  Negative for abdominal pain.  Genitourinary: Negative.  Negative for dysuria.  Musculoskeletal: Negative.  Negative for back pain.  Skin: Negative.  Negative for rash.  Neurological: Negative.  Negative for dizziness, focal weakness, weakness and headaches.  Psychiatric/Behavioral:  The patient is nervous/anxious.     As per HPI. Otherwise, a complete review of systems is negative.  PAST MEDICAL HISTORY: Past Medical History:  Diagnosis Date   Adrenal benign tumor    Adrenal hyperplasia    Anxiety    Diabetes mellitus without complication (HCC)    Ductal carcinoma in situ (DCIS) of right breast    GERD (gastroesophageal reflux disease)    History of  repair of congenital cleft palate    Leukocytosis    a.) in setting of chronic corticosteroid use; b.) seen in consult by hematology 2019 --> flow cytometry revealed no immunophenotypic abnormality (no monocolon B cell population or neoplastic T cell process)   PONV (postoperative nausea and vomiting)    Sleep apnea    a.) unable to afford DME required for nocturnal PAP    PAST SURGICAL HISTORY: Past Surgical History:  Procedure Laterality Date   AXILLARY SENTINEL NODE BIOPSY Right 07/02/2024   Procedure: BIOPSY, LYMPH NODE, SENTINEL, AXILLARY;  Surgeon: Tye Millet, DO;  Location: [facility];  Service: General;  Laterality: Right;   BREAST BIOPSY Right 06/08/2024   US  RT BREAST BX W LOC DEV 1ST LESION IMG BX SPEC US  GUIDE 06/08/2024 [facility]   BREAST BIOPSY Right 06/22/2024   US  RT BREAST SAVI/RF TAG 1ST LESION US  GUIDE 06/22/2024 [facility]   BREAST LUMPECTOMY WITH RADIO FREQUENCY LOCALIZER Right 07/02/2024   Procedure: BREAST LUMPECTOMY WITH RADIO FREQUENCY LOCALIZER;  Surgeon: Tye Millet, DO;  Location: [facility];  Service: General;  Laterality: Right;   CHOLECYSTECTOMY     CLEFT PALATE REPAIR     MYRINGOTOMY WITH TUBE PLACEMENT Bilateral 06/25/2023   Procedure: MYRINGOTOMY WITH TUBE PLACEMENT (BUTTERFLY TUBES);  Surgeon: [name], MD;  Location: [facility];  Service: ENT;  Laterality: Bilateral;   PORTACATH PLACEMENT Left 08/17/2024   Procedure: INSERTION, TUNNELED CENTRAL VENOUS DEVICE, WITH PORT;  Surgeon: Tye Millet, DO;  Location: [facility];  Service: General;  Laterality: Left;   removal of right adrenal gland  2024   done at [facility]  RECONSTRUCTION SURGERY  1985    FAMILY HISTORY: Family History  Problem Relation Age of Onset   Ovarian cancer Mother    Bipolar disorder Mother    Diabetes Mother    Uterine cancer Sister    Breast cancer Sister 91       mat half sister   Hypertension Father    Heart disease Father    Diabetes  Maternal Grandmother    Brain cancer Paternal Grandmother     ADVANCED DIRECTIVES (Y/N):  N  HEALTH MAINTENANCE: Social History   Tobacco Use   Smoking status: Former    Current packs/day: 0.00    Types: Cigarettes    Quit date: 2018    Years since quitting: 8.0   Smokeless tobacco: Never  Vaping Use   Vaping status: Never Used  Substance Use Topics   Alcohol use: Yes    Alcohol/week: 1.0 standard drink of alcohol    Types: 1 Cans of beer per week    Comment: rarely   Drug use: No     Colonoscopy:  PAP:  Bone density:  Lipid panel:  Allergies  Allergen Reactions   Benzodiazepines Hives   Tramadol  Nausea And Vomiting   Morphine  And Codeine  Nausea And Vomiting   Nucynta [Tapentadol] Nausea And Vomiting   Oxycodone  Itching    Current Outpatient Medications  Medication Sig Dispense Refill   acetaminophen  (TYLENOL ) 500 MG tablet Take 500-1,000 mg by mouth every 6 (six) hours as needed (pain.).     ALPRAZolam (XANAX) 0.5 MG tablet Take 0.25 mg by mouth at bedtime.     amLODipine (NORVASC) 2.5 MG tablet Take 2.5 mg by mouth daily.     cyclobenzaprine  (FLEXERIL ) 10 MG tablet Take 0.5-1 tablets (5-10 mg total) by mouth 3 (three) times daily as needed. 30 tablet 0   FARXIGA 10 MG TABS tablet Take 10 mg by mouth daily.     fludrocortisone (FLORINEF) 0.1 MG tablet Take 0.1 mg by mouth in the morning.     fluticasone  (FLONASE ) 50 MCG/ACT nasal spray Place 1 spray into both nostrils daily as needed for allergies or rhinitis.     HYDROcodone -acetaminophen  (NORCO/VICODIN) 5-325 MG tablet Take 1 tablet by mouth every 8 (eight) hours as needed for moderate pain (pain score 4-6). 15 tablet 0   ibuprofen  (ADVIL ) 200 MG tablet Take 2 tablets (400 mg total) by mouth every 8 (eight) hours as needed (pain.). 30 tablet 0   Pediatric Multivit-Minerals (FLINTSTONES COMPLETE) CHEW Chew 1 tablet by mouth in the morning.     predniSONE (DELTASONE) 10 MG tablet Take 5-10 mg by mouth See admin  instructions. 5 at night 10 am     prochlorperazine  (COMPAZINE ) 10 MG tablet Take 1 tablet (10 mg total) by mouth every 6 (six) hours as needed for nausea or vomiting. 60 tablet 2   promethazine  (PHENERGAN ) 12.5 MG tablet Take 12.5 mg by mouth every 8 (eight) hours as needed for vomiting or nausea.     simvastatin (ZOCOR) 40 MG tablet Take 40 mg by mouth at bedtime.     SitaGLIPtin-MetFORMIN HCl 50-1000 MG TB24 Take 1 tablet by mouth in the morning and at bedtime.     SOLU-CORTEF  100 MG injection Inject 2 mLs into the muscle as needed (Adrenal Crisis).     lidocaine -prilocaine  (EMLA ) cream Apply 1 Application topically once. (Patient not taking: Reported on 08/30/2024)     ondansetron  (ZOFRAN ) 4 MG tablet Take 1 tablet (4 mg total) by mouth every 8 (  eight) hours as needed for nausea or vomiting. (Patient not taking: Reported on 08/30/2024) 20 tablet 0   ondansetron  (ZOFRAN ) 8 MG tablet Take 1 tab (8 mg) by mouth every 8 hrs as needed for nausea/vomiting. Start third day after doxorubicin /cyclophosphamide  chemotherapy. (Patient not taking: Reported on 08/30/2024) 60 tablet 2   No current facility-administered medications for this visit.    OBJECTIVE: Vitals:   08/30/24 1422  BP: (!) 129/99  Pulse: (!) 125  Resp: 18  Temp: (!) 97.3 F (36.3 C)  SpO2: 96%      Body mass index is 35.35 kg/m.    ECOG FS:0 - Asymptomatic  General: Well-developed, well-nourished, no acute distress. Eyes: Pink conjunctiva, anicteric sclera. HEENT: Normocephalic, moist mucous membranes. Lungs: No audible wheezing or coughing. Heart: Regular rate and rhythm. Abdomen: Soft, nontender, no obvious distention. Musculoskeletal: No edema, cyanosis, or clubbing. Neuro: Alert, answering all questions appropriately. Cranial nerves grossly intact. Skin: No rashes or petechiae noted. Psych: Normal affect.  LAB RESULTS:  Lab Results  Component Value Date   NA 128 (L) 08/30/2024   K 4.3 08/30/2024   CL 90 (L)  08/30/2024   CO2 19 (L) 08/30/2024   GLUCOSE 219 (H) 08/30/2024   BUN 21 (H) 08/30/2024   CREATININE 0.77 08/30/2024   CALCIUM 10.5 (H) 08/30/2024   PROT 8.3 (H) 08/30/2024   ALBUMIN 5.0 08/30/2024   AST 32 08/30/2024   ALT 57 (H) 08/30/2024   ALKPHOS 94 08/30/2024   BILITOT 0.5 08/30/2024   GFRNONAA >60 08/30/2024   GFRAA >60 09/08/2018    Lab Results  Component Value Date   WBC 24.3 (H) 08/30/2024   NEUTROABS 17.3 (H) 08/30/2024   HGB 14.7 08/30/2024   HCT 45.0 08/30/2024   MCV 82.4 08/30/2024   PLT 375 08/30/2024     STUDIES: DG CHEST PORT 1 VIEW Result Date: 08/17/2024 CLINICAL DATA:  Port-A-Cath none EXAM: PORTABLE CHEST 1 VIEW COMPARISON:  01/08/2016 FINDINGS: Left-sided central venous port with tip projecting over the low right atrium/right atrial IVC junction. Marked hypoventilatory change results in exaggeration of the cardiac size, enlarged mediastinal silhouette likely due to low lung volume. Aortic atherosclerosis. Bibasilar atelectasis. No pneumothorax IMPRESSION: 1. Left-sided central venous port with tip projecting over the low right atrium/right atrial IVC junction. No pneumothorax. 2. Marked hypoventilatory change with bibasilar atelectasis. Electronically Signed   By: Luke Bun M.D.   On: 08/17/2024 16:45   DG C-Arm 1-60 Min-No Report Result Date: 08/17/2024 Fluoroscopy was utilized by the requesting physician.  No radiographic interpretation.   ECHOCARDIOGRAM COMPLETE Result Date: 08/04/2024    ECHOCARDIOGRAM REPORT   Patient Name:   Tina Carrillo [facility] Date of Exam: 08/04/2024 Medical Rec #:  [id number]      Height:       59.0 in Accession #:    [id number]     Weight:       181.0 lb Date of Birth:  Oct 26, 1970     BSA:          1.768 m Patient Age:    53 years       BP:           118/72 mmHg Patient Gender: F              HR:           102 bpm. Exam Location:  [facility] Procedure: 2D Echo, Cardiac Doppler and Color Doppler (Both Spectral and Color  Flow  Doppler were utilized during procedure). Indications:     Z51.11 Encounter for antineoplastic chemotheraphy  History:         Patient has no prior history of Echocardiogram examinations.                  Risk Factors:Diabetes.  Sonographer:     Marshall Benders Referring Phys:  [id number] [name] JINNY REUSING Diagnosing Phys: [name] Lunger MD  Sonographer Comments: Technically difficult study due to poor echo windows, suboptimal parasternal window and suboptimal apical window. Image acquisition challenging due to patient body habitus. IMPRESSIONS  1. Left ventricular ejection fraction, by estimation, is 60 to 65%. The left ventricle has normal function. The left ventricle has no regional wall motion abnormalities. Left ventricular diastolic parameters are consistent with Grade I diastolic dysfunction (impaired relaxation).  2. Right ventricular systolic function is normal. The right ventricular size is normal.  3. The mitral valve is normal in structure. No evidence of mitral valve regurgitation. No evidence of mitral stenosis.  4. The aortic valve is normal in structure. Aortic valve regurgitation is not visualized. No aortic stenosis is present.  5. The inferior vena cava is normal in size with greater than 50% respiratory variability, suggesting right atrial pressure of 3 mmHg. FINDINGS  Left Ventricle: Left ventricular ejection fraction, by estimation, is 60 to 65%. The left ventricle has normal function. The left ventricle has no regional wall motion abnormalities. Strain was performed and the global longitudinal strain is indeterminate. The left ventricular internal cavity size was normal in size. There is no left ventricular hypertrophy. Left ventricular diastolic parameters are consistent with Grade I diastolic dysfunction (impaired relaxation). Right Ventricle: The right ventricular size is normal. No increase in right ventricular wall thickness. Right ventricular systolic function is normal. Left Atrium: Left  atrial size was normal in size. Right Atrium: Right atrial size was normal in size. Pericardium: There is no evidence of pericardial effusion. Mitral Valve: The mitral valve is normal in structure. No evidence of mitral valve regurgitation. No evidence of mitral valve stenosis. Tricuspid Valve: The tricuspid valve is normal in structure. Tricuspid valve regurgitation is not demonstrated. No evidence of tricuspid stenosis. Aortic Valve: The aortic valve is normal in structure. Aortic valve regurgitation is not visualized. No aortic stenosis is present. Aortic valve mean gradient measures 2.0 mmHg. Aortic valve peak gradient measures 4.2 mmHg. Aortic valve area, by VTI measures 3.34 cm. Pulmonic Valve: The pulmonic valve was normal in structure. Pulmonic valve regurgitation is not visualized. No evidence of pulmonic stenosis. Aorta: The aortic root is normal in size and structure. Venous: The inferior vena cava is normal in size with greater than 50% respiratory variability, suggesting right atrial pressure of 3 mmHg. IAS/Shunts: No atrial level shunt detected by color flow Doppler. Additional Comments: 3D was performed not requiring image post processing on an independent workstation and was indeterminate.  LEFT VENTRICLE PLAX 2D LVIDd:         3.80 cm   Diastology LVIDs:         2.90 cm   LV e' medial:    6.85 cm/s LV PW:         1.00 cm   LV E/e' medial:  6.7 LV IVS:        1.00 cm   LV e' lateral:   8.16 cm/s LVOT diam:     2.20 cm   LV E/e' lateral: 5.6 LV SV:         52  LV SV Index:   29 LVOT Area:     3.80 cm  RIGHT VENTRICLE            IVC RV S prime:     9.14 cm/s  IVC diam: 1.10 cm TAPSE (M-mode): 1.6 cm LEFT ATRIUM             Index        RIGHT ATRIUM          Index LA diam:        3.70 cm 2.09 cm/m   RA Area:     8.58 cm LA Vol (A2C):   23.2 ml 13.12 ml/m  RA Volume:   14.90 ml 8.43 ml/m LA Vol (A4C):   30.6 ml 17.31 ml/m LA Biplane Vol: 26.6 ml 15.05 ml/m  AORTIC VALVE AV Area (Vmax):    3.41 cm  AV Area (Vmean):   3.21 cm AV Area (VTI):     3.34 cm AV Vmax:           103.00 cm/s AV Vmean:          70.600 cm/s AV VTI:            0.156 m AV Peak Grad:      4.2 mmHg AV Mean Grad:      2.0 mmHg LVOT Vmax:         92.40 cm/s LVOT Vmean:        59.600 cm/s LVOT VTI:          0.137 m LVOT/AV VTI ratio: 0.88  AORTA Ao Root diam: 3.30 cm Ao Asc diam:  3.20 cm MITRAL VALVE MV Area (PHT): 5.02 cm    SHUNTS MV Decel Time: 151 msec    Systemic VTI:  0.14 m MV E velocity: 45.90 cm/s  Systemic Diam: 2.20 cm MV A velocity: 78.80 cm/s MV E/A ratio:  0.58 [name] Lunger MD Electronically signed by [name] Lunger MD Signature Date/Time: 08/04/2024/1:39:39 PM    Final      ASSESSMENT: Pathologic stage Ib triple negative invasive carcinoma of right breast.  PLAN:    Pathologic stage Ib triple negative invasive carcinoma of right breast: Patient underwent lumpectomy on July 02, 2024 upgrading her malignancy to invasive triple negative carcinoma.  She does not require Oncotype testing.  We discussed at length the recommendation of adjuvant chemotherapy using Adriamycin , Cytoxan , and Taxol with G-CSF support.  Patient wishes to pursue adjuvant treatment.  Cardiac echo from August 06, 2024 revealed an EF of 60 to 65%.  Patient has had port placement.  After 4 cycles of Adriamycin  and Cytoxan , patient will receive 12 cycles of weekly Taxol followed by adjuvant XRT.  An aromatase inhibitor would not offer benefit.  Given her new onset hyponatremia and hypercalcemia, will get PET scan for further evaluation.  Proceed with cycle 1 of Adriamycin  and Cytoxan  today.  Return to clinic in 2 days for G-CSF support.  Patient will then return to clinic in 1 week for laboratory work and further evaluation and then in 2 weeks for further evaluation and consideration of cycle 2.  Anxiety: Will include 0.5 mg IV Ativan  with each treatment. Congenital adrenal hyperplasia/diabetes: Continue follow-up and treatment per  endocrinology. Genetics: Patient was previously given a referral to genetic counseling. Hyponatremia: Patient's sodium level is 128 today. Hypercalcemia: Patient's calcium level is 10.5. Leukocytosis: Likely reactive, monitor.   Patient expressed understanding and was in agreement with this plan. She also understands that She can call clinic  at any time with any questions, concerns, or complaints.    Cancer Staging  Invasive ductal carcinoma of right breast [facility]) Staging form: Breast, AJCC 8th Edition - Pathologic stage from 07/22/2024: Stage IB (pT1b, pN0, cM0, G3, ER-, PR-, HER2-) - Signed by [name] [name], MD on 07/22/2024 Stage prefix: Initial diagnosis Histologic grading system: 3 grade system   [name] [name], MD   08/31/2024 7:07 AM     "

## 2024-08-31 ENCOUNTER — Telehealth: Payer: Self-pay

## 2024-08-31 ENCOUNTER — Other Ambulatory Visit: Payer: Self-pay

## 2024-08-31 ENCOUNTER — Encounter: Payer: Self-pay | Admitting: Oncology

## 2024-08-31 NOTE — Telephone Encounter (Signed)
 Forms- completed- waiting on Dr. [name] to sign.

## 2024-08-31 NOTE — Telephone Encounter (Signed)
Telephone call to patient for follow up after receiving first infusion.   Patient states infusion went great.  States eating good and drinking plenty of fluids.   Denies any nausea or vomiting.  Encouraged patient to call for any concerns or questions. 

## 2024-09-01 ENCOUNTER — Inpatient Hospital Stay

## 2024-09-01 ENCOUNTER — Inpatient Hospital Stay: Admitting: Licensed Clinical Social Worker

## 2024-09-01 ENCOUNTER — Ambulatory Visit

## 2024-09-01 ENCOUNTER — Other Ambulatory Visit: Payer: Self-pay

## 2024-09-01 NOTE — Telephone Encounter (Signed)
 09/01/24-form signed by Dr. [name] and forms faxed to [employer]. Fax confirmation [email or web address]

## 2024-09-02 ENCOUNTER — Encounter: Payer: Self-pay | Admitting: *Deleted

## 2024-09-02 ENCOUNTER — Inpatient Hospital Stay

## 2024-09-02 NOTE — Progress Notes (Signed)
 Tina Carrillo called stating she had been vomiting today and is asking if ok to take phenergan .   She was encouraged to take the compazine  but stated she wanted to try the phenergan  she already had on hand to help her rest.   Per Dr. [name] ok to take her phenergan .   Symptom management clinic was offered but she didn't have a way to come in today.   She will let us  know if symptoms don't improve and she would like to see [facility] tomorrow.

## 2024-09-03 ENCOUNTER — Inpatient Hospital Stay: Admitting: Nurse Practitioner

## 2024-09-03 ENCOUNTER — Inpatient Hospital Stay

## 2024-09-03 ENCOUNTER — Encounter: Payer: Self-pay | Admitting: Nurse Practitioner

## 2024-09-03 ENCOUNTER — Telehealth: Payer: Self-pay | Admitting: *Deleted

## 2024-09-03 VITALS — BP 123/47 | HR 113 | Temp 97.7°F | Resp 18 | Wt 175.3 lb

## 2024-09-03 DIAGNOSIS — R112 Nausea with vomiting, unspecified: Secondary | ICD-10-CM

## 2024-09-03 DIAGNOSIS — E860 Dehydration: Secondary | ICD-10-CM | POA: Diagnosis not present

## 2024-09-03 DIAGNOSIS — C50911 Malignant neoplasm of unspecified site of right female breast: Secondary | ICD-10-CM

## 2024-09-03 DIAGNOSIS — R197 Diarrhea, unspecified: Secondary | ICD-10-CM

## 2024-09-03 DIAGNOSIS — E871 Hypo-osmolality and hyponatremia: Secondary | ICD-10-CM | POA: Diagnosis not present

## 2024-09-03 DIAGNOSIS — Z5111 Encounter for antineoplastic chemotherapy: Secondary | ICD-10-CM | POA: Diagnosis not present

## 2024-09-03 LAB — CBC WITH DIFFERENTIAL (CANCER CENTER ONLY)
Abs Immature Granulocytes: 0.24 K/uL — ABNORMAL HIGH (ref 0.00–0.07)
Basophils Absolute: 0.1 K/uL (ref 0.0–0.1)
Basophils Relative: 0 %
Eosinophils Absolute: 0 K/uL (ref 0.0–0.5)
Eosinophils Relative: 0 %
HCT: 44.9 % (ref 36.0–46.0)
Hemoglobin: 15.3 g/dL — ABNORMAL HIGH (ref 12.0–15.0)
Immature Granulocytes: 1 %
Lymphocytes Relative: 12 %
Lymphs Abs: 2.2 K/uL (ref 0.7–4.0)
MCH: 27.4 pg (ref 26.0–34.0)
MCHC: 34.1 g/dL (ref 30.0–36.0)
MCV: 80.5 fL (ref 80.0–100.0)
Monocytes Absolute: 0.1 K/uL (ref 0.1–1.0)
Monocytes Relative: 1 %
Neutro Abs: 15.6 K/uL — ABNORMAL HIGH (ref 1.7–7.7)
Neutrophils Relative %: 86 %
Platelet Count: 203 K/uL (ref 150–400)
RBC: 5.58 MIL/uL — ABNORMAL HIGH (ref 3.87–5.11)
RDW: 13.6 % (ref 11.5–15.5)
WBC Count: 18.3 K/uL — ABNORMAL HIGH (ref 4.0–10.5)
nRBC: 0 % (ref 0.0–0.2)

## 2024-09-03 LAB — CMP (CANCER CENTER ONLY)
ALT: 36 U/L (ref 0–44)
AST: 17 U/L (ref 15–41)
Albumin: 4.8 g/dL (ref 3.5–5.0)
Alkaline Phosphatase: 87 U/L (ref 38–126)
Anion gap: 19 — ABNORMAL HIGH (ref 5–15)
BUN: 23 mg/dL — ABNORMAL HIGH (ref 6–20)
CO2: 17 mmol/L — ABNORMAL LOW (ref 22–32)
Calcium: 10.2 mg/dL (ref 8.9–10.3)
Chloride: 92 mmol/L — ABNORMAL LOW (ref 98–111)
Creatinine: 0.62 mg/dL (ref 0.44–1.00)
GFR, Estimated: >60 mL/min
Glucose, Bld: 250 mg/dL — ABNORMAL HIGH (ref 70–99)
Potassium: 4.3 mmol/L (ref 3.5–5.1)
Sodium: 127 mmol/L — ABNORMAL LOW (ref 135–145)
Total Bilirubin: 0.9 mg/dL (ref 0.0–1.2)
Total Protein: 7.9 g/dL (ref 6.5–8.1)

## 2024-09-03 LAB — MAGNESIUM: Magnesium: 2.1 mg/dL (ref 1.7–2.4)

## 2024-09-03 MED ORDER — PROCHLORPERAZINE EDISYLATE 10 MG/2ML IJ SOLN
10.0000 mg | Freq: Once | INTRAMUSCULAR | Status: AC
  Administered 2024-09-03: 10 mg via INTRAVENOUS
  Filled 2024-09-03: qty 2

## 2024-09-03 MED ORDER — SODIUM CHLORIDE 0.9 % IV SOLN
Freq: Once | INTRAVENOUS | Status: AC
  Filled 2024-09-03: qty 250

## 2024-09-03 NOTE — Progress Notes (Unsigned)
 Pt being seen in [facility] for nausea, vomiting and diarrhea.  Pt states she has been experiencing symptoms for 3 days.  Last vomitted 1.5 hrs ago, states she'll got to sleep and wake back up and be sick again.  Pt reports 3 loose stools today. Family member present with patient.

## 2024-09-03 NOTE — Telephone Encounter (Addendum)
 "     NURSING SYMPTOM TRIAGE ASSESSMENT & DISPOSITION  Mode of interaction:  Telephone Date of symptom triage interaction:  09/03/2024 Time of symptom triage interaction:  1306   Cancer diagnosis:  Invasive ductal carcinoma of right breast [facility]) Patient is on Treatment Plan :  BREAST DOSE DENSE AC q14d / PACLitaxel q7d  Last treatment date:  08/30/2024- 1st treatment Oral chemotherapy:  No   Symptom Triage Protocol:  Nausea and Vomiting   History of the problem:  Nausea  Severity: 0-10 scale with 0 as not severe and 10 as unimaginably severe 7 - 9 = Severe  Precipitating factors: Emetogenic chemo  Onset: Severe over the last 24-48 hours. Pt is status post 1'st cycle of chemotherapy-AC  Duration: Intractable nausea vomiting over the last 24-48 hours. Not relieved by antiemetics.  Relieving factors: She has taken both Zofran  and compazine . Took phenergan  yesterday. No relief with the zofran . Took last dose of compazine  10 mg about 1 hour ago. Last episode of vomiting an hour and half ago.  *note: patient stated that she took an extra dose of prednisone  at 1030 am to attempt to handle her symptoms. She normally takes this due to adrenal insuffiencey   Nonpharmacologic interventions: None tried  Food and fluid intake over past 24 hours: Not able to keep anything down  Associated symptoms: Nausea, vomiting, diarrhea; weakness; denies fevers, cough, shortness of breath, chest pain  Nausea Grade: 1 = Loss of appetite without alteration in eating habits 2 = Oral intake decreased without significant weight loss, dehydration, or malnutrition 3 = Inadequate oral caloric or fluid intake; tube feeding, TPN, or hospitalization indicated 3    History of the problem:  Vomiting  Character, color, force, quality of vomit:  Intractable episodes of N&V characterized by intense forceful episodes despite intervention  Other household members experiencing similar symptoms:  no  Are there  other GI symptoms:  Diarrhea and Abdominal distension  Severity: 0-10 scale with 0 as not severe and 10 as unimaginably severe 7 - 9 = Severe  Precipitating factors:  Emetogenic chemo  Onset:  S/p chemotherapy  Duration:  See Nausea assessment  Relieving factors:  See Nausea assessment  Nonpharmacologic interventions:    Food and Fluid intake over past 24 hours:  See Nausea assessment  Associated symptoms:  Dry mucous membranes and Weakness  Vomiting Grade:   1 = Intervention not indicated  2 = Outpatient IV hydration; medical intervention indicated  3 = Tube feeding, TPN, or hospitalization indicated  4 = Life-threatening consequences  5 = Death  2    Triage Nurse Guidance  Signs and Symptoms Action  Grossly bloody stool   Signs of severe dehydration  Severe lethargy or weakness  Heart palpitations  Decreased urine output  Sunken eyes  Orthostatic hypotension  Dizziness   Temperature higher than 100.4 F WITH suspect neutropenia   Symptoms of immune-mediated colitis, such as diarrhea accompanied by blood in the stool, severe abdominal pain, or tenderness   Symptoms of bowel perforation, such as diarrhea associated with severe abdominal pain, fever chills, nausea, and vomiting  Seek emergency care.  Call an ambulance immediately.   Excessive thirst, dry mouth  Temperature higher than 100.4 F WITHOUT suspect neutropenia  Diarrhea for more than five (5) days  More than four to six stools above baseline per day for two days  Swollen or painful abdomen  More than 10 stools per day  Weight loss of more than five (5) pounds since  diarrhea began  Continued diarrhea despite antidiarrheal treatment  Decreased turgor; pinched skin does not spring back  Grade 3 or 4 nausea and vomiting  Seek urgent care within 24 hours.   Less than four (4) stools per day  Chronic diarrhea  Other family members with diarrhea  Recent travel to a foreign country  New prescription  Follow homecare  instructions.  Notify MD if no improvement.      Patient Instruction  Disclaimer:  Patient specific and [employer] instruction provided verbally and sent through MyChart for active users.    Review with healthcare provider the prescribed antiemetic therapy, dose schedule, and route. Correctly and regularly comply with prescribed medication. Take frequent small sips of fluids.  Eat small amounts of food frequently.  Supplement diet with ginger or foods containing ginger.  Take an antiemetic 20 minutes prior to meals.  Monitor for signs of dehydration. Use distraction therapies (example:  music, breathing techniques, etc.)  Contact a healthcare provider during clinic hours if symptoms persist or become worse. If the patient is compliant with the antiemetic medication, contact the healthcare provider to receive an alternative antiemetic prescription  Seek Emergency Care Immediately if Any of the Following Occurs: Evidence of dehydration  Unable to eat or drink for 24 hours  Treatment change not effective within six hours  Teach Back Method used:  Yes    History of the problem:  Diarrhea Frequency of stools:  10 + in the last 24 hours.  Consistency:  Liquid  Color:  brown  Odor:  Foul smelling odor to stool  Undigested food or fat:  No  Mucous present:  No  Blood present:  No  Number of stools in past 24 hours:  10+  Weight loss:  unknown  Urine output:  Normal voiding  Urine Character:  clear  Signs of dehydration:  Dry mouth, Weakness, and Decreased skin turgor (patient reported)  Precipitating factors:  Chemo-AC  Onset:  S/p chemotherapy  Duration:  Ongoing; multiple loose stools. She just had another episode of diarrhea prior to this phone call  Relieving factors:  Has not tried imodium .  Associated symptoms:  Nausea and Vomiting  Changes in ADLs:  Yes: weakness; having someone help her get dressed.     Diet History  Food intolerance:  none  Food aversions:  none   Food allergies:  none  Consumption of well water :  No  Ingestion of unpasteurized milk or milk products:  No  Consumption of raw seafood:  No  Recent travel abroad:  No  Exposure to farm animals or animal feces  No    Triage Nurse Guidance  Signs and Symptoms Action  Grossly bloody stool   Signs of severe dehydration  Severe lethargy or weakness  Heart palpitations  Decreased urine output  Sunken eyes  Orthostatic hypotension  Dizziness   Temperature higher than 100.4 F WITH suspect neutropenia   Symptoms of immune-mediated colitis, such as diarrhea accompanied by blood in the stool, severe abdominal pain, or tenderness   Symptoms of bowel perforation, such as diarrhea associated with severe abdominal pain, fever chills, nausea, and vomiting  Seek emergency care.  Call an ambulance immediately.   Excessive thirst, dry mouth  Temperature higher than 100.4 F WITHOUT suspect neutropenia  Diarrhea for more than five (5) days  More than four to six stools above baseline per day for two days  Swollen or painful abdomen  More than 10 stools per day  Weight loss of more than  five (5) pounds since diarrhea began  Continued diarrhea despite antidiarrheal treatment  Decreased turgor; pinched skin does not spring back  Grade 3 or 4 nausea and vomiting  Seek urgent care within 24 hours.   Less than four (4) stools per day  Chronic diarrhea  Other family members with diarrhea  Recent travel to a foreign country  New prescription  Follow homecare instructions.  Notify MD if no improvement.      Patient Instruction  Disclaimer:  Patient specific and [employer] instruction provided verbally and sent through MyChart for active users.    Consider food and fluid modifications: Drink 8-10 eight-ounce glasses of fluids per day (e.g., water , diluted cranberry juice, sports drinks, decaffeinated tea or coffee). Eat foods high in soluble fiber (e.g., bananas, oatmeal, applesauce, skinned  turkey, chicken, rice, toast). Consider foods containing pectin, a natural fiber that decreases diarrhea. These foods include beets, peeled apples, white rice, bananas, baked potatoes without skin, white bread, plain pasta, avocadoes, and asparagus tips. Eat easy-to-digest foods high in protein, calories, and potassium. - Cook all vegetables well. Raw vegetables are difficult to digest. - Eat small, frequent meals. Do not eat large meals. - Eat foods at room temperature, as hot and cold temperature foods may insti-gate diarrhea. Avoid foods high in insoluble fiber (e.g., raw fruits and vegetables, skins, seeds, legumes). Avoid milk and dairy products, caffeine, alcohol, sucrose, and sorbitol. - Avoid greasy, fatty, spicy, or fried foods and foods containing olestra. - Refrain from taking fiber supplements.  Implement a rectal skin care routine: Clean the perineal area well with mild soap and water  or aloe-based baby wipes. Apply barrier ointment (e.g., zinc oxide) for protection. Sitz baths may add comfort.  Examine the rectal area for red, scaly, or broken skin. If this is present, report it to the healthcare provider. Record the frequency, quality, and volume of stools during the course of treatment.  If diarrhea lasts more than 24 hours, notify the healthcare provider.  Also, consult a healthcare provider before taking any over-the-counter antidiarrheal medications. These can be very effective but may not be appropriate for this situation.  If treatment with immunotherapy or targeted therapies has been prescribed, consultation with the healthcare team should begin prior to starting antidiarrheal.  If prescribed, keep track of medications administered--type, amount, and frequency.  Report the Following Problems: Inability to keep fluids down for 24 hours Diarrhea lasting more than 24 hours Dark yellow urine or absence of urine production More than four (4) to six (6) bowel movements above  baseline per day for two days in a row Grade 2 or higher nausea and vomiting that accompanies diarrhea Dizziness Rectal bleeding Temperature greater than 100.4 F Swollen or painful abdomen Red, scaly, or broken skin of the rectal area  Teach Back Method used:  Yes       Provider Consulted  Provider name and credentials: Msg sent to [facility] providers; cc: Dr. [name].  Provider instruction: Port labs/ smc-Lindsey-IV fluids-per Morna, NP. Cbc, metc, mag      Nurse Triage Priority:  Urgent (obtain medical orders as indicated with instruction for 8-24 hour or sooner follow-up)  Barriers to Care:  None identified  Nurse Triage Disposition:  In-person follow-up visit:  patient advised to come now for [facility]- port labs. Fluids. Pt schedule to see Morna, NP  Protocol Source:  Ruthine HERO., & Eldonna CANDIE Pee.). (2019). Telephone triage for oncology nurses (3rd ed.). Oncology Nursing Society.  "

## 2024-09-04 ENCOUNTER — Other Ambulatory Visit: Payer: Self-pay

## 2024-09-06 ENCOUNTER — Telehealth: Payer: Self-pay

## 2024-09-06 ENCOUNTER — Telehealth: Payer: Self-pay | Admitting: *Deleted

## 2024-09-06 ENCOUNTER — Inpatient Hospital Stay

## 2024-09-06 ENCOUNTER — Inpatient Hospital Stay
Admission: AD | Admit: 2024-09-06 | Discharge: 2024-09-11 | DRG: 640 | Disposition: A | Discharge status: Home / Self Care | Source: Ambulatory Visit | Attending: Student | Admitting: Student

## 2024-09-06 ENCOUNTER — Inpatient Hospital Stay: Admitting: Oncology

## 2024-09-06 ENCOUNTER — Encounter (HOSPITAL_COMMUNITY): Payer: Self-pay

## 2024-09-06 ENCOUNTER — Encounter: Payer: Self-pay | Admitting: Oncology

## 2024-09-06 VITALS — BP 110/86 | HR 146 | Temp 97.6°F | Resp 20 | Ht 59.0 in | Wt 168.0 lb

## 2024-09-06 DIAGNOSIS — Z818 Family history of other mental and behavioral disorders: Secondary | ICD-10-CM

## 2024-09-06 DIAGNOSIS — C50911 Malignant neoplasm of unspecified site of right female breast: Secondary | ICD-10-CM | POA: Diagnosis not present

## 2024-09-06 DIAGNOSIS — Z79899 Other long term (current) drug therapy: Secondary | ICD-10-CM

## 2024-09-06 DIAGNOSIS — E871 Hypo-osmolality and hyponatremia: Principal | ICD-10-CM | POA: Diagnosis present

## 2024-09-06 DIAGNOSIS — E1165 Type 2 diabetes mellitus with hyperglycemia: Secondary | ICD-10-CM | POA: Diagnosis present

## 2024-09-06 DIAGNOSIS — K219 Gastro-esophageal reflux disease without esophagitis: Secondary | ICD-10-CM | POA: Diagnosis present

## 2024-09-06 DIAGNOSIS — Z87891 Personal history of nicotine dependence: Secondary | ICD-10-CM

## 2024-09-06 DIAGNOSIS — Z6833 Body mass index (BMI) 33.0-33.9, adult: Secondary | ICD-10-CM

## 2024-09-06 DIAGNOSIS — E876 Hypokalemia: Secondary | ICD-10-CM | POA: Diagnosis present

## 2024-09-06 DIAGNOSIS — Z803 Family history of malignant neoplasm of breast: Secondary | ICD-10-CM

## 2024-09-06 DIAGNOSIS — F419 Anxiety disorder, unspecified: Secondary | ICD-10-CM | POA: Diagnosis present

## 2024-09-06 DIAGNOSIS — E559 Vitamin D deficiency, unspecified: Secondary | ICD-10-CM | POA: Diagnosis present

## 2024-09-06 DIAGNOSIS — I10 Essential (primary) hypertension: Secondary | ICD-10-CM | POA: Diagnosis present

## 2024-09-06 DIAGNOSIS — Z8041 Family history of malignant neoplasm of ovary: Secondary | ICD-10-CM

## 2024-09-06 DIAGNOSIS — E785 Hyperlipidemia, unspecified: Secondary | ICD-10-CM | POA: Diagnosis present

## 2024-09-06 DIAGNOSIS — D6959 Other secondary thrombocytopenia: Secondary | ICD-10-CM | POA: Diagnosis present

## 2024-09-06 DIAGNOSIS — Z8049 Family history of malignant neoplasm of other genital organs: Secondary | ICD-10-CM

## 2024-09-06 DIAGNOSIS — I959 Hypotension, unspecified: Secondary | ICD-10-CM | POA: Diagnosis present

## 2024-09-06 DIAGNOSIS — Z9049 Acquired absence of other specified parts of digestive tract: Secondary | ICD-10-CM

## 2024-09-06 DIAGNOSIS — D61810 Antineoplastic chemotherapy induced pancytopenia: Secondary | ICD-10-CM | POA: Diagnosis present

## 2024-09-06 DIAGNOSIS — Z885 Allergy status to narcotic agent status: Secondary | ICD-10-CM

## 2024-09-06 DIAGNOSIS — Z5111 Encounter for antineoplastic chemotherapy: Secondary | ICD-10-CM | POA: Diagnosis not present

## 2024-09-06 DIAGNOSIS — Z7952 Long term (current) use of systemic steroids: Secondary | ICD-10-CM

## 2024-09-06 DIAGNOSIS — Z7984 Long term (current) use of oral hypoglycemic drugs: Secondary | ICD-10-CM

## 2024-09-06 DIAGNOSIS — R197 Diarrhea, unspecified: Secondary | ICD-10-CM | POA: Diagnosis present

## 2024-09-06 DIAGNOSIS — Z86000 Personal history of in-situ neoplasm of breast: Secondary | ICD-10-CM

## 2024-09-06 DIAGNOSIS — T451X5A Adverse effect of antineoplastic and immunosuppressive drugs, initial encounter: Secondary | ICD-10-CM | POA: Diagnosis present

## 2024-09-06 DIAGNOSIS — D61818 Other pancytopenia: Secondary | ICD-10-CM | POA: Diagnosis present

## 2024-09-06 DIAGNOSIS — R000 Tachycardia, unspecified: Secondary | ICD-10-CM | POA: Diagnosis present

## 2024-09-06 DIAGNOSIS — G4730 Sleep apnea, unspecified: Secondary | ICD-10-CM | POA: Diagnosis present

## 2024-09-06 DIAGNOSIS — E878 Other disorders of electrolyte and fluid balance, not elsewhere classified: Secondary | ICD-10-CM | POA: Diagnosis present

## 2024-09-06 DIAGNOSIS — Z17421 Hormone receptor negative with human epidermal growth factor receptor 2 negative status: Secondary | ICD-10-CM

## 2024-09-06 DIAGNOSIS — Z87730 Personal history of (corrected) cleft lip and palate: Secondary | ICD-10-CM

## 2024-09-06 DIAGNOSIS — Z8249 Family history of ischemic heart disease and other diseases of the circulatory system: Secondary | ICD-10-CM

## 2024-09-06 DIAGNOSIS — E2740 Unspecified adrenocortical insufficiency: Secondary | ICD-10-CM | POA: Diagnosis present

## 2024-09-06 DIAGNOSIS — Z833 Family history of diabetes mellitus: Secondary | ICD-10-CM

## 2024-09-06 DIAGNOSIS — E66811 Obesity, class 1: Secondary | ICD-10-CM | POA: Diagnosis present

## 2024-09-06 LAB — CBC WITH DIFFERENTIAL/PLATELET
Abs Immature Granulocytes: 0.09 K/uL — ABNORMAL HIGH (ref 0.00–0.07)
Basophils Absolute: 0.1 K/uL (ref 0.0–0.1)
Basophils Relative: 2 %
Eosinophils Absolute: 0 K/uL (ref 0.0–0.5)
Eosinophils Relative: 1 %
HCT: 40 % (ref 36.0–46.0)
Hemoglobin: 13.7 g/dL (ref 12.0–15.0)
Immature Granulocytes: 2 %
Lymphocytes Relative: 36 %
Lymphs Abs: 1.4 K/uL (ref 0.7–4.0)
MCH: 27 pg (ref 26.0–34.0)
MCHC: 34.3 g/dL (ref 30.0–36.0)
MCV: 78.7 fL — ABNORMAL LOW (ref 80.0–100.0)
Monocytes Absolute: 0 K/uL — ABNORMAL LOW (ref 0.1–1.0)
Monocytes Relative: 1 %
Neutro Abs: 2.3 K/uL (ref 1.7–7.7)
Neutrophils Relative %: 58 %
Platelets: 116 K/uL — ABNORMAL LOW (ref 150–400)
RBC: 5.08 MIL/uL (ref 3.87–5.11)
RDW: 13 % (ref 11.5–15.5)
WBC: 3.9 K/uL — ABNORMAL LOW (ref 4.0–10.5)
nRBC: 0 % (ref 0.0–0.2)

## 2024-09-06 LAB — CMP (CANCER CENTER ONLY)
ALT: 61 U/L — ABNORMAL HIGH (ref 0–44)
AST: 38 U/L (ref 15–41)
Albumin: 4.4 g/dL (ref 3.5–5.0)
Alkaline Phosphatase: 81 U/L (ref 38–126)
Anion gap: 16 — ABNORMAL HIGH (ref 5–15)
BUN: 20 mg/dL (ref 6–20)
CO2: 19 mmol/L — ABNORMAL LOW (ref 22–32)
Calcium: 10 mg/dL (ref 8.9–10.3)
Chloride: 83 mmol/L — ABNORMAL LOW (ref 98–111)
Creatinine: 0.64 mg/dL (ref 0.44–1.00)
GFR, Estimated: >60 mL/min
Glucose, Bld: 311 mg/dL — ABNORMAL HIGH (ref 70–99)
Potassium: 3.9 mmol/L (ref 3.5–5.1)
Sodium: 118 mmol/L — CL (ref 135–145)
Total Bilirubin: 0.6 mg/dL (ref 0.0–1.2)
Total Protein: 7.3 g/dL (ref 6.5–8.1)

## 2024-09-06 LAB — GLUCOSE, CAPILLARY
Glucose-Capillary: 215 mg/dL — ABNORMAL HIGH (ref 70–99)
Glucose-Capillary: 167 mg/dL — ABNORMAL HIGH (ref 70–99)

## 2024-09-06 MED ORDER — INSULIN ASPART 100 UNIT/ML IJ SOLN: 0.0000 [IU] | Freq: Three times a day (TID) | INTRAMUSCULAR | Status: DC

## 2024-09-06 MED ORDER — ALPRAZOLAM 0.25 MG PO TABS
0.2500 mg | ORAL_TABLET | Freq: Every day | ORAL | Status: DC
  Administered 2024-09-06 – 2024-09-10 (×5): 0.25 mg via ORAL
  Filled 2024-09-06 (×5): qty 1

## 2024-09-06 MED ORDER — HYDROCODONE-ACETAMINOPHEN 5-325 MG PO TABS
1.0000 | ORAL_TABLET | Freq: Three times a day (TID) | ORAL | Status: DC | PRN
  Administered 2024-09-10 – 2024-09-11 (×2): 1 via ORAL
  Filled 2024-09-06 (×2): qty 1

## 2024-09-06 MED ORDER — IBUPROFEN 400 MG PO TABS: 400.0000 mg | ORAL_TABLET | Freq: Three times a day (TID) | ORAL | Status: DC | PRN

## 2024-09-06 MED ORDER — PREDNISONE 10 MG PO TABS
5.0000 mg | ORAL_TABLET | Freq: Every day | ORAL | Status: DC
  Administered 2024-09-06 – 2024-09-10 (×5): 5 mg via ORAL
  Filled 2024-09-06 (×5): qty 1

## 2024-09-06 MED ORDER — ENOXAPARIN SODIUM 40 MG/0.4ML IJ SOSY
40.0000 mg | PREFILLED_SYRINGE | INTRAMUSCULAR | Status: DC
  Administered 2024-09-06 – 2024-09-07 (×2): 40 mg via SUBCUTANEOUS
  Filled 2024-09-06 (×2): qty 0.4

## 2024-09-06 MED ORDER — SODIUM CHLORIDE 0.9 % IV SOLN: INTRAVENOUS | Status: DC

## 2024-09-06 MED ORDER — FLUDROCORTISONE ACETATE 0.1 MG PO TABS
0.1000 mg | ORAL_TABLET | Freq: Every morning | ORAL | Status: DC
  Administered 2024-09-07 – 2024-09-11 (×5): 0.1 mg via ORAL
  Filled 2024-09-06 (×5): qty 1

## 2024-09-06 MED ORDER — SODIUM CHLORIDE 0.9 % IV SOLN: 12.5000 mg | Freq: Four times a day (QID) | INTRAVENOUS | Status: DC | PRN

## 2024-09-06 MED ORDER — PREDNISONE 10 MG PO TABS
10.0000 mg | ORAL_TABLET | Freq: Every day | ORAL | Status: DC
  Administered 2024-09-07 – 2024-09-11 (×5): 10 mg via ORAL
  Filled 2024-09-06 (×5): qty 1

## 2024-09-06 MED ORDER — ONDANSETRON HCL 4 MG/2ML IJ SOLN
4.0000 mg | Freq: Four times a day (QID) | INTRAMUSCULAR | Status: DC | PRN
  Administered 2024-09-06: 4 mg via INTRAVENOUS
  Filled 2024-09-06: qty 2

## 2024-09-06 MED ORDER — CYCLOBENZAPRINE HCL 10 MG PO TABS: 5.0000 mg | ORAL_TABLET | Freq: Three times a day (TID) | ORAL | Status: DC | PRN

## 2024-09-06 MED ORDER — FLUTICASONE PROPIONATE 50 MCG/ACT NA SUSP
1.0000 | Freq: Every day | NASAL | Status: DC | PRN
  Filled 2024-09-06: qty 16

## 2024-09-06 MED ORDER — DIPHENOXYLATE-ATROPINE 2.5-0.025 MG PO TABS
1.0000 | ORAL_TABLET | Freq: Four times a day (QID) | ORAL | Status: DC | PRN
  Administered 2024-09-06 – 2024-09-07 (×3): 1 via ORAL
  Filled 2024-09-06 (×3): qty 1

## 2024-09-06 MED ORDER — ALBUTEROL SULFATE (2.5 MG/3ML) 0.083% IN NEBU: 2.5000 mg | INHALATION_SOLUTION | RESPIRATORY_TRACT | Status: DC | PRN

## 2024-09-06 MED ORDER — PREDNISONE 10 MG PO TABS: 5.0000 mg | ORAL_TABLET | ORAL | Status: DC

## 2024-09-06 MED ORDER — ACETAMINOPHEN 500 MG PO TABS
500.0000 mg | ORAL_TABLET | Freq: Four times a day (QID) | ORAL | Status: DC | PRN
  Administered 2024-09-10: 500 mg via ORAL
  Administered 2024-09-11 (×2): 1000 mg via ORAL
  Filled 2024-09-06 (×3): qty 2

## 2024-09-06 NOTE — Telephone Encounter (Signed)
 Took patient over to the medical mall to registration to get admitted into her room. I left the patient with the gentleman at the front desk at the medical mall.

## 2024-09-06 NOTE — Telephone Encounter (Signed)
 RN placed call to 1C to give report on patient, patient being direct admitted for hyponatremia. RN made aware that RN taking patient was in with a patient at this time. RN advised that patient is in route to the unit as she has been in clinic waiting on a bed since her 9:30 am appointment. Brooke, RN will call back to the cancer center for report.

## 2024-09-06 NOTE — Progress Notes (Signed)
 Patient is still feeling crappy, nausea, she takes the compazine  and makes her have shortness of breath, so I told her to try the zofran  instead. She is having severe diarrhea, took 4 imodium  and it didn't help. Tired due to her not able to sleep good at night.

## 2024-09-06 NOTE — Telephone Encounter (Signed)
 RN called report to Lyle, CHARITY FUNDRAISER on 1C. Patient has been transferred to 1C.

## 2024-09-06 NOTE — Progress Notes (Signed)
 " [facility]  Telephone:([phone] [phone]  ID: Tina Carrillo OB: December 07, 1970  MR#: [id number]  RDW#:[id number]  [name], NP as PCP - General (Family Medicine) Georgina Shasta POUR, RN as Oncology Nurse Navigator [name], [name], MD as Consulting Physician (Oncology)  CHIEF COMPLAINT: Pathologic stage Ib triple negative invasive carcinoma of right breast.  INTERVAL HISTORY: Patient returns to clinic today for further evaluation and to assess her toleration of cycle 1 of Adriamycin  and Cytoxan .  She has significant increase in weakness and fatigue, nausea, and diarrhea.  She has a poor appetite.  She continues to be anxious.  She has no chest pain, shortness of breath, cough, or hemoptysis.  She has no urinary complaints.  Patient feels generally terrible, but offers no further specific complaints today.  REVIEW OF SYSTEMS:   Review of Systems  Constitutional:  Positive for malaise/fatigue. Negative for fever and weight loss.  Respiratory: Negative.  Negative for cough, hemoptysis and shortness of breath.   Cardiovascular: Negative.  Negative for chest pain and leg swelling.  Gastrointestinal:  Positive for diarrhea, nausea and vomiting. Negative for abdominal pain.  Genitourinary: Negative.  Negative for dysuria.  Musculoskeletal: Negative.  Negative for back pain.  Skin: Negative.  Negative for rash.  Neurological:  Positive for weakness. Negative for dizziness, focal weakness and headaches.  Psychiatric/Behavioral:  The patient is nervous/anxious.     As per HPI. Otherwise, a complete review of systems is negative.  PAST MEDICAL HISTORY: Past Medical History:  Diagnosis Date   Adrenal benign tumor    Adrenal hyperplasia    Anxiety    Diabetes mellitus without complication (HCC)    Ductal carcinoma in situ (DCIS) of right breast    GERD (gastroesophageal reflux disease)    History of repair of congenital cleft palate     Leukocytosis    a.) in setting of chronic corticosteroid use; b.) seen in consult by hematology 2019 --> flow cytometry revealed no immunophenotypic abnormality (no monocolon B cell population or neoplastic T cell process)   PONV (postoperative nausea and vomiting)    Sleep apnea    a.) unable to afford DME required for nocturnal PAP    PAST SURGICAL HISTORY: Past Surgical History:  Procedure Laterality Date   AXILLARY SENTINEL NODE BIOPSY Right 07/02/2024   Procedure: BIOPSY, LYMPH NODE, SENTINEL, AXILLARY;  Surgeon: Tye Millet, DO;  Location: [facility];  Service: General;  Laterality: Right;   BREAST BIOPSY Right 06/08/2024   US  RT BREAST BX W LOC DEV 1ST LESION IMG BX SPEC US  GUIDE 06/08/2024 [facility]   BREAST BIOPSY Right 06/22/2024   US  RT BREAST SAVI/RF TAG 1ST LESION US  GUIDE 06/22/2024 [facility]   BREAST LUMPECTOMY WITH RADIO FREQUENCY LOCALIZER Right 07/02/2024   Procedure: BREAST LUMPECTOMY WITH RADIO FREQUENCY LOCALIZER;  Surgeon: Tye Millet, DO;  Location: [facility];  Service: General;  Laterality: Right;   CHOLECYSTECTOMY     CLEFT PALATE REPAIR     MYRINGOTOMY WITH TUBE PLACEMENT Bilateral 06/25/2023   Procedure: MYRINGOTOMY WITH TUBE PLACEMENT (BUTTERFLY TUBES);  Surgeon: [name], MD;  Location: [facility];  Service: ENT;  Laterality: Bilateral;   PORTACATH PLACEMENT Left 08/17/2024   Procedure: INSERTION, TUNNELED CENTRAL VENOUS DEVICE, WITH PORT;  Surgeon: Tye Millet, DO;  Location: [facility];  Service: General;  Laterality: Left;   removal of right adrenal gland  2024   done at [facility]   VAGINA RECONSTRUCTION SURGERY  1985  FAMILY HISTORY: Family History  Problem Relation Age of Onset   Ovarian cancer Mother    Bipolar disorder Mother    Diabetes Mother    Uterine cancer Sister    Breast cancer Sister 60       mat half sister   Hypertension Father    Heart disease Father    Diabetes Maternal Grandmother    Brain cancer  Paternal Grandmother     ADVANCED DIRECTIVES (Y/N):  N  HEALTH MAINTENANCE: Social History   Tobacco Use   Smoking status: Former    Current packs/day: 0.00    Types: Cigarettes    Quit date: 2018    Years since quitting: 8.0   Smokeless tobacco: Never  Vaping Use   Vaping status: Never Used  Substance Use Topics   Alcohol use: Yes    Alcohol/week: 1.0 standard drink of alcohol    Types: 1 Cans of beer per week    Comment: rarely   Drug use: No     Colonoscopy:  PAP:  Bone density:  Lipid panel:  Allergies  Allergen Reactions   Benzodiazepines Hives   Tramadol  Nausea And Vomiting   Morphine  And Codeine  Nausea And Vomiting   Nucynta [Tapentadol] Nausea And Vomiting   Oxycodone  Itching    No current facility-administered medications for this visit.   No current outpatient medications on file.   Facility-Administered Medications Ordered in Other Visits  Medication Dose Route Frequency Provider Last Rate Last Admin   0.9 %  sodium chloride  infusion   Intravenous Continuous Sreenath, Sudheer B, MD       acetaminophen  (TYLENOL ) tablet 500-1,000 mg  500-1,000 mg Oral Q6H PRN Sreenath, Sudheer B, MD       albuterol  (PROVENTIL ) (2.5 MG/3ML) 0.083% nebulizer solution 2.5 mg  2.5 mg Nebulization Q2H PRN Sreenath, Sudheer B, MD       ALPRAZolam  (XANAX ) tablet 0.25 mg  0.25 mg Oral QHS Sreenath, Sudheer B, MD       cyclobenzaprine  (FLEXERIL ) tablet 5-10 mg  5-10 mg Oral TID PRN Sreenath, Sudheer B, MD       diphenoxylate -atropine  (LOMOTIL ) 2.5-0.025 MG per tablet 1 tablet  1 tablet Oral QID PRN Jhonny Sahara B, MD       enoxaparin  (LOVENOX ) injection 40 mg  40 mg Subcutaneous Q24H Jhonny Sahara B, MD       [START ON 09/07/2024] fludrocortisone  (FLORINEF ) tablet 0.1 mg  0.1 mg Oral q AM Sreenath, Sudheer B, MD       fluticasone  (FLONASE ) 50 MCG/ACT nasal spray 1 spray  1 spray Each Nare Daily PRN Sreenath, Sudheer B, MD       HYDROcodone -acetaminophen  (NORCO/VICODIN)  5-325 MG per tablet 1 tablet  1 tablet Oral Q8H PRN Sreenath, Sudheer B, MD       ibuprofen  (ADVIL ) tablet 400 mg  400 mg Oral Q8H PRN Jhonny Sahara NOVAK, MD       [START ON 09/07/2024] insulin  aspart (novoLOG ) injection 0-9 Units  0-9 Units Subcutaneous TID WC Sreenath, Sudheer B, MD       ondansetron  (ZOFRAN ) injection 4 mg  4 mg Intravenous Q6H PRN Sreenath, Sudheer B, MD       predniSONE  (DELTASONE ) tablet 5-10 mg  5-10 mg Oral See admin instructions Sreenath, Sudheer B, MD       promethazine  (PHENERGAN ) 12.5 mg in sodium chloride  0.9 % 50 mL IVPB  12.5 mg Intravenous Q6H PRN Jhonny Sahara NOVAK, MD        OBJECTIVE:  Vitals:   09/06/24 0930  BP: 110/86  Pulse: (!) 146  Resp: 20  Temp: 97.6 F (36.4 C)  SpO2: 99%      Body mass index is 33.93 kg/m.    ECOG FS:3 - Symptomatic, >50% confined to bed  General: Ill-appearing, no acute distress. Eyes: Pink conjunctiva, anicteric sclera. HEENT: Normocephalic, moist mucous membranes. Lungs: No audible wheezing or coughing. Heart: Regular rate and rhythm. Abdomen: Soft, nontender, no obvious distention. Musculoskeletal: No edema, cyanosis, or clubbing. Neuro: Alert, answering all questions appropriately. Cranial nerves grossly intact. Skin: No rashes or petechiae noted. Psych: Normal affect.  LAB RESULTS:  Lab Results  Component Value Date   NA 118 (LL) 09/06/2024   K 3.9 09/06/2024   CL 83 (L) 09/06/2024   CO2 19 (L) 09/06/2024   GLUCOSE 311 (H) 09/06/2024   BUN 20 09/06/2024   CREATININE 0.64 09/06/2024   CALCIUM 10.0 09/06/2024   PROT 7.3 09/06/2024   ALBUMIN 4.4 09/06/2024   AST 38 09/06/2024   ALT 61 (H) 09/06/2024   ALKPHOS 81 09/06/2024   BILITOT 0.6 09/06/2024   GFRNONAA >60 09/06/2024   GFRAA >60 09/08/2018    Lab Results  Component Value Date   WBC 3.9 (L) 09/06/2024   NEUTROABS 2.3 09/06/2024   HGB 13.7 09/06/2024   HCT 40.0 09/06/2024   MCV 78.7 (L) 09/06/2024   PLT 116 (L) 09/06/2024      STUDIES: DG CHEST PORT 1 VIEW Result Date: 08/17/2024 CLINICAL DATA:  Port-A-Cath none EXAM: PORTABLE CHEST 1 VIEW COMPARISON:  01/08/2016 FINDINGS: Left-sided central venous port with tip projecting over the low right atrium/right atrial IVC junction. Marked hypoventilatory change results in exaggeration of the cardiac size, enlarged mediastinal silhouette likely due to low lung volume. Aortic atherosclerosis. Bibasilar atelectasis. No pneumothorax IMPRESSION: 1. Left-sided central venous port with tip projecting over the low right atrium/right atrial IVC junction. No pneumothorax. 2. Marked hypoventilatory change with bibasilar atelectasis. Electronically Signed   By: Luke Bun M.D.   On: 08/17/2024 16:45   DG C-Arm 1-60 Min-No Report Result Date: 08/17/2024 Fluoroscopy was utilized by the requesting physician.  No radiographic interpretation.     ASSESSMENT: Pathologic stage Ib triple negative invasive carcinoma of right breast.  PLAN:    Pathologic stage Ib triple negative invasive carcinoma of right breast: Patient underwent lumpectomy on July 02, 2024 upgrading her malignancy to invasive triple negative carcinoma.  She does not require Oncotype testing.  We discussed at length the recommendation of adjuvant chemotherapy using Adriamycin , Cytoxan , and Taxol with G-CSF support.  Patient wishes to pursue adjuvant treatment.  Cardiac echo from August 06, 2024 revealed an EF of 60 to 65%.  Patient has had port placement.  After 4 cycles of Adriamycin  and Cytoxan , patient will receive 12 cycles of weekly Taxol followed by adjuvant XRT.  An aromatase inhibitor would not offer benefit.  Given her new onset hyponatremia and hypercalcemia, will get PET scan for further evaluation.  Patient received cycle 1 of Adriamycin  and Cytoxan . Her next appointment is scheduled for 1 week for cycle 2, but that will depend on her recovery from current admission. Anxiety: Continue 0.5 mg IV Ativan   with each treatment. Congenital adrenal hyperplasia/diabetes: Continue follow-up and treatment per endocrinology. Genetics: Patient was previously given a referral to genetic counseling. Hyponatremia: Sodium level significantly reduced to 118.  Patient was a direct admitted to the hospital.  Recommend nephrology consult.   Hyperglycemia: The patient's blood glucose is significantly elevated at  311.  Continue monitoring and treatment per endocrinology.   Hypercalcemia: Resolved. Neutropenia: Mild.  Patient not receive her G-CSF support as planned. Thrombocytopenia: Secondary to chemotherapy, monitor. Nausea/vomiting/diarrhea: Likely secondary to treatment.  Admit to the hospital as above.   Patient expressed understanding and was in agreement with this plan. She also understands that She can call clinic at any time with any questions, concerns, or complaints.    Cancer Staging  Invasive ductal carcinoma of right breast [facility]) Staging form: Breast, AJCC 8th Edition - Pathologic stage from 07/22/2024: Stage IB (pT1b, pN0, cM0, G3, ER-, PR-, HER2-) - Signed by [name] [name], MD on 07/22/2024 Stage prefix: Initial diagnosis Histologic grading system: 3 grade system   [name] [name], MD   09/06/2024 4:54 PM     "

## 2024-09-06 NOTE — Telephone Encounter (Signed)
 Patient had a critical lab called and spoke with Cyndee, she has a sodium level of 118.

## 2024-09-06 NOTE — H&P (Signed)
 " History and Physical    Tina Carrillo FMW:[id number] DOB: Mar 03, 1971 DOA: 09/06/2024  PCP: Harvey Gaetana CROME, NP  Patient coming from: Home  I have personally briefly reviewed patient's old medical records in [facility]  Chief Complaint: Intractable nausea vomiting and diarrhea  HPI: Tina Carrillo is a 54 y.o. female with medical history significant of congenital adrenal hyperplasia, breast cancer on active chemotherapy who presents to the hospital with a 3 to 4-day history of intractable nausea and vomiting associated with diarrhea.  Patient presented to the cancer center on 1/12 and was noted to P very weak and tremulous.  Vital signs were significant for elevated pulse rate of 137.  Remainder vital signs within normal limits.  Direct admission to the hospital was requested by the oncology service.  When I evaluated the patient at bedside she is resting in bed.  She appears fatigued.  Sister at bedside.  Patient answers all questions appropriately.  Confirms 3 to 4 days of diarrhea that immediately followed a chemotherapeutic treatment.  Laboratory vesication significant for hyponatremia 118 however corrected sodium level is 123.  Remainder of hemogram and metabolic profile reassuring.  ED Course: N/A  Review of Systems: As per HPI otherwise 14 point review of systems negative.   Past Medical History:  Diagnosis Date   Adrenal benign tumor    Adrenal hyperplasia    Anxiety    Diabetes mellitus without complication (HCC)    Ductal carcinoma in situ (DCIS) of right breast    GERD (gastroesophageal reflux disease)    History of repair of congenital cleft palate    Leukocytosis    a.) in setting of chronic corticosteroid use; b.) seen in consult by hematology 2019 --> flow cytometry revealed no immunophenotypic abnormality (no monocolon B cell population or neoplastic T cell process)   PONV (postoperative nausea and vomiting)    Sleep apnea    a.) unable to afford DME required  for nocturnal PAP    Past Surgical History:  Procedure Laterality Date   AXILLARY SENTINEL NODE BIOPSY Right 07/02/2024   Procedure: BIOPSY, LYMPH NODE, SENTINEL, AXILLARY;  Surgeon: Tye Millet, DO;  Location: [facility];  Service: General;  Laterality: Right;   BREAST BIOPSY Right 06/08/2024   US  RT BREAST BX W LOC DEV 1ST LESION IMG BX SPEC US  GUIDE 06/08/2024 [facility]   BREAST BIOPSY Right 06/22/2024   US  RT BREAST SAVI/RF TAG 1ST LESION US  GUIDE 06/22/2024 [facility]   BREAST LUMPECTOMY WITH RADIO FREQUENCY LOCALIZER Right 07/02/2024   Procedure: BREAST LUMPECTOMY WITH RADIO FREQUENCY LOCALIZER;  Surgeon: Tye Millet, DO;  Location: [facility];  Service: General;  Laterality: Right;   CHOLECYSTECTOMY     CLEFT PALATE REPAIR     MYRINGOTOMY WITH TUBE PLACEMENT Bilateral 06/25/2023   Procedure: MYRINGOTOMY WITH TUBE PLACEMENT (BUTTERFLY TUBES);  Surgeon: [name], MD;  Location: [facility];  Service: ENT;  Laterality: Bilateral;   PORTACATH PLACEMENT Left 08/17/2024   Procedure: INSERTION, TUNNELED CENTRAL VENOUS DEVICE, WITH PORT;  Surgeon: Tye Millet, DO;  Location: [facility];  Service: General;  Laterality: Left;   removal of right adrenal gland  2024   done at [facility]   VAGINA RECONSTRUCTION SURGERY  1985     reports that she quit smoking about 8 years ago. Her smoking use included cigarettes. She has never used smokeless tobacco. She reports current alcohol use of about 1.0 standard drink of alcohol per week. She reports that she does not use drugs.  Allergies[1]  Family History  Problem Relation Age of Onset   Ovarian cancer Mother    Bipolar disorder Mother    Diabetes Mother    Uterine cancer Sister    Breast cancer Sister 27       mat half sister   Hypertension Father    Heart disease Father    Diabetes Maternal Grandmother    Brain cancer Paternal Grandmother     Prior to Admission medications  Medication Sig Start Date End Date Taking?  Authorizing Provider  acetaminophen  (TYLENOL ) 500 MG tablet Take 500-1,000 mg by mouth every 6 (six) hours as needed (pain.).    [provider]  ALPRAZolam  (XANAX ) 0.5 MG tablet Take 0.25 mg by mouth at bedtime.    [provider]  amLODipine (NORVASC) 2.5 MG tablet Take 2.5 mg by mouth daily.    [provider]  cyclobenzaprine  (FLEXERIL ) 10 MG tablet Take 0.5-1 tablets (5-10 mg total) by mouth 3 (three) times daily as needed. 02/20/24   Vivienne Delon HERO, PA-C  FARXIGA  10 MG TABS tablet Take 10 mg by mouth daily.    [provider]  fludrocortisone  (FLORINEF ) 0.1 MG tablet Take 0.1 mg by mouth in the morning.    [provider]  fluticasone  (FLONASE ) 50 MCG/ACT nasal spray Place 1 spray into both nostrils daily as needed for allergies or rhinitis.    [provider]  HYDROcodone -acetaminophen  (NORCO/VICODIN) 5-325 MG tablet Take 1 tablet by mouth every 8 (eight) hours as needed for moderate pain (pain score 4-6). 08/17/24   Tye, Isami, DO  ibuprofen  (ADVIL ) 200 MG tablet Take 2 tablets (400 mg total) by mouth every 8 (eight) hours as needed (pain.). 07/02/24   Tye, Isami, DO  lidocaine -prilocaine  (EMLA ) cream Apply 1 Application topically once. Patient not taking: Reported on 08/30/2024 06/14/24   [provider]  ondansetron  (ZOFRAN ) 4 MG tablet Take 1 tablet (4 mg total) by mouth every 8 (eight) hours as needed for nausea or vomiting. Patient not taking: Reported on 09/03/2024 07/02/24   Sakai, Isami, DO  ondansetron  (ZOFRAN ) 8 MG tablet Take 1 tab (8 mg) by mouth every 8 hrs as needed for nausea/vomiting. Start third day after doxorubicin /cyclophosphamide  chemotherapy. Patient not taking: Reported on 09/03/2024 08/04/24   [name] [name], MD  Pediatric Multivit-Minerals [facility] COMPLETE) CHEW Chew 1 tablet by mouth in the morning. Patient not taking: Reported on 09/03/2024    [provider]  predniSONE  (DELTASONE )  10 MG tablet Take 5-10 mg by mouth See admin instructions. 5 at night 10 am    [provider]  prochlorperazine  (COMPAZINE ) 10 MG tablet Take 1 tablet (10 mg total) by mouth every 6 (six) hours as needed for nausea or vomiting. 08/04/24   [name] [name], MD  promethazine  (PHENERGAN ) 12.5 MG tablet Take 12.5 mg by mouth every 8 (eight) hours as needed for vomiting or nausea. 08/16/19   [provider]  simvastatin (ZOCOR) 40 MG tablet Take 40 mg by mouth at bedtime. Patient not taking: Reported on 09/03/2024 04/29/24 04/29/25  [provider]  SitaGLIPtin-MetFORMIN  HCl 50-1000 MG TB24 Take 1 tablet by mouth in the morning and at bedtime. Patient not taking: Reported on 09/03/2024 08/07/23 09/03/24  [provider]  SOLU-CORTEF  100 MG injection Inject 2 mLs into the muscle as needed (Adrenal Crisis).    [provider]    Physical Exam: Vitals:   09/06/24 1628  BP: 114/79  Pulse: (!) 133  Resp: 18  Temp: 98 F (36.7 C)  TempSrc: Oral  SpO2: 100%   General: Appears fatigued otherwise stable HEENT: Normocephalic, atraumatic Neck, supple, trachea midline, no tenderness Heart: Cardia, regular rhythm, no murmurs Lungs: Clear to auscultation bilaterally, no adventitious sounds, normal work of breathing Abdomen: Soft, nontender, nondistended, positive bowel sounds Extremities: Normal, atraumatic, no clubbing or cyanosis, normal muscle tone Skin: No rashes or lesions, normal color Neurologic: Cranial nerves grossly intact, sensation intact, alert and oriented x3 Psychiatric: Normal affect   Labs on Admission: I have personally reviewed following labs and imaging studies  CBC: Recent Labs  Lab 09/03/24 1435 09/06/24 0840  WBC 18.3* 3.9*  NEUTROABS 15.6* 2.3  HGB 15.3* 13.7  HCT 44.9 40.0  MCV 80.5 78.7*  PLT 203 116*   Basic Metabolic Panel: Recent Labs  Lab 09/03/24 1435 09/06/24 0840  NA 127* 118*  K 4.3 3.9  CL 92* 83*  CO2 17*  19*  GLUCOSE 250* 311*  BUN 23* 20  CREATININE 0.62 0.64  CALCIUM 10.2 10.0  MG 2.1  --    GFR: Estimated Creatinine Clearance: 73.2 mL/min (by C-G formula based on SCr of 0.64 mg/dL). Liver Function Tests: Recent Labs  Lab 09/03/24 1435 09/06/24 0840  AST 17 38  ALT 36 61*  ALKPHOS 87 81  BILITOT 0.9 0.6  PROT 7.9 7.3  ALBUMIN 4.8 4.4   No results for input(s): LIPASE, AMYLASE in the last 168 hours. No results for input(s): AMMONIA in the last 168 hours. Coagulation Profile: No results for input(s): INR, PROTIME in the last 168 hours. Cardiac Enzymes: No results for input(s): CKTOTAL, CKMB, CKMBINDEX, TROPONINI in the last 168 hours. BNP (last 3 results) No results for input(s): PROBNP in the last 8760 hours. HbA1C: No results for input(s): HGBA1C in the last 72 hours. CBG: Recent Labs  Lab 09/06/24 1627  GLUCAP 215*   Lipid Profile: No results for input(s): CHOL, HDL, LDLCALC, TRIG, CHOLHDL, LDLDIRECT in the last 72 hours. Thyroid  Function Tests: No results for input(s): TSH, T4TOTAL, FREET4, T3FREE, THYROIDAB in the last 72 hours. Anemia Panel: No results for input(s): VITAMINB12, FOLATE, FERRITIN, TIBC, IRON, RETICCTPCT in the last 72 hours. Urine analysis:    Component Value Date/Time   COLORURINE STRAW (A) 01/08/2016 1431   APPEARANCEUR CLEAR (A) 01/08/2016 1431   APPEARANCEUR Clear 06/04/2013 2100   LABSPEC 1.009 01/08/2016 1431   LABSPEC 1.013 06/04/2013 2100   PHURINE 6.0 01/08/2016 1431   GLUCOSEU NEGATIVE 01/08/2016 1431   GLUCOSEU Negative 06/04/2013 2100   HGBUR NEGATIVE 01/08/2016 1431   BILIRUBINUR neg 05/19/2018 1634   BILIRUBINUR Negative 06/04/2013 2100   KETONESUR TRACE (A) 01/08/2016 1431   PROTEINUR Negative 05/19/2018 1634   PROTEINUR NEGATIVE 01/08/2016 1431   UROBILINOGEN 0.2 05/19/2018 1634   NITRITE neg 05/19/2018 1634   NITRITE NEGATIVE 01/08/2016 1431   LEUKOCYTESUR  Negative 05/19/2018 1634   LEUKOCYTESUR Negative 06/04/2013 2100    Radiological Exams on Admission: No results found.  EKG: Not completed at time of this note  Assessment/Plan Principal Problem:   Hyponatremia  Hyponatremia Hypochloremia Appears fairly acute.  Last metabolic panel in 09/03/2024 was significant for mild hyponatremia at 127.  Sodium corrected for hyperglycemia is 123.  This is worsened subsequently presumably due to intractable diarrhea in the setting of recent chemotherapy.  I suspect this is hypovolemic hyponatremia Plan: Place in observation Telemetry monitoring Normal saline at 100 cc/h Recheck sodium level in a.m.  Intractable nausea and vomiting Diarrhea This started approximately  3 to 4 days ago after patient's last round of chemotherapy.  At this time I have relatively low suspicion for infectious etiology. Plan: As needed Lomotil  4 times daily As needed Phenergan  As needed Zofran  for refractory nausea and vomiting Diet as tolerated  Congenital adrenal hyperplasia Chronic adrenal insufficiency No indication of adrenal crisis Vital signs reassuring Plan: Florinef  0.1 mg every morning Prednisone  5 to 10 mg per home regimen  Breast cancer On active chemotherapy Outpatient oncology follow-up  Hypertension Hold home antihypertensives for now  Anxiety PTA Xanax   Hyperlipidemia Hold statin  Diabetes mellitus with hyperglycemia Hold oral agents Sliding scale coverage for now Check A1c   DVT prophylaxis: SQ Lovenox  Code Status: Full Family Communication: Sister at bedside Disposition Plan: Anticipate return to previous home environment Consults called: None Admission status: Observation, telemetry   Calvin KATHEE Robson MD [facility]   If 7PM-7AM, please contact night-coverage   09/06/2024, 4:41 PM       [1]  Allergies Allergen Reactions   Benzodiazepines Hives   Tramadol  Nausea And Vomiting   Morphine  And Codeine   Nausea And Vomiting   Nucynta [Tapentadol] Nausea And Vomiting   Oxycodone  Itching   "

## 2024-09-06 NOTE — Progress Notes (Signed)
 No IVF today

## 2024-09-06 NOTE — Telephone Encounter (Signed)
 Patient is here in our office right now, patients sister come out of the room and told me that the patient was starting to feel very shaky and just all around feeling worse. I took her BP and her pulse and O2; BP was 98/59, pulse was 137 and her O2 was 98.

## 2024-09-07 ENCOUNTER — Other Ambulatory Visit: Payer: Self-pay

## 2024-09-07 ENCOUNTER — Encounter: Payer: Self-pay | Admitting: Oncology

## 2024-09-07 DIAGNOSIS — T451X5A Adverse effect of antineoplastic and immunosuppressive drugs, initial encounter: Secondary | ICD-10-CM | POA: Diagnosis present

## 2024-09-07 DIAGNOSIS — C50911 Malignant neoplasm of unspecified site of right female breast: Secondary | ICD-10-CM | POA: Diagnosis present

## 2024-09-07 DIAGNOSIS — E559 Vitamin D deficiency, unspecified: Secondary | ICD-10-CM | POA: Diagnosis present

## 2024-09-07 DIAGNOSIS — D6959 Other secondary thrombocytopenia: Secondary | ICD-10-CM | POA: Diagnosis present

## 2024-09-07 DIAGNOSIS — Z8249 Family history of ischemic heart disease and other diseases of the circulatory system: Secondary | ICD-10-CM | POA: Diagnosis not present

## 2024-09-07 DIAGNOSIS — E871 Hypo-osmolality and hyponatremia: Secondary | ICD-10-CM | POA: Diagnosis present

## 2024-09-07 DIAGNOSIS — E1165 Type 2 diabetes mellitus with hyperglycemia: Secondary | ICD-10-CM | POA: Diagnosis present

## 2024-09-07 DIAGNOSIS — I10 Essential (primary) hypertension: Secondary | ICD-10-CM | POA: Diagnosis present

## 2024-09-07 DIAGNOSIS — Z87891 Personal history of nicotine dependence: Secondary | ICD-10-CM | POA: Diagnosis not present

## 2024-09-07 DIAGNOSIS — E878 Other disorders of electrolyte and fluid balance, not elsewhere classified: Secondary | ICD-10-CM | POA: Diagnosis present

## 2024-09-07 DIAGNOSIS — E2740 Unspecified adrenocortical insufficiency: Secondary | ICD-10-CM | POA: Diagnosis present

## 2024-09-07 DIAGNOSIS — Z803 Family history of malignant neoplasm of breast: Secondary | ICD-10-CM | POA: Diagnosis not present

## 2024-09-07 DIAGNOSIS — Z833 Family history of diabetes mellitus: Secondary | ICD-10-CM | POA: Diagnosis not present

## 2024-09-07 DIAGNOSIS — D61810 Antineoplastic chemotherapy induced pancytopenia: Secondary | ICD-10-CM | POA: Diagnosis present

## 2024-09-07 DIAGNOSIS — Z885 Allergy status to narcotic agent status: Secondary | ICD-10-CM | POA: Diagnosis not present

## 2024-09-07 DIAGNOSIS — E66811 Obesity, class 1: Secondary | ICD-10-CM | POA: Diagnosis present

## 2024-09-07 DIAGNOSIS — R197 Diarrhea, unspecified: Secondary | ICD-10-CM | POA: Diagnosis present

## 2024-09-07 DIAGNOSIS — R000 Tachycardia, unspecified: Secondary | ICD-10-CM | POA: Diagnosis present

## 2024-09-07 DIAGNOSIS — Z6833 Body mass index (BMI) 33.0-33.9, adult: Secondary | ICD-10-CM | POA: Diagnosis not present

## 2024-09-07 DIAGNOSIS — E876 Hypokalemia: Secondary | ICD-10-CM | POA: Diagnosis present

## 2024-09-07 DIAGNOSIS — F419 Anxiety disorder, unspecified: Secondary | ICD-10-CM | POA: Diagnosis present

## 2024-09-07 DIAGNOSIS — E785 Hyperlipidemia, unspecified: Secondary | ICD-10-CM | POA: Diagnosis present

## 2024-09-07 DIAGNOSIS — R112 Nausea with vomiting, unspecified: Secondary | ICD-10-CM | POA: Diagnosis present

## 2024-09-07 DIAGNOSIS — Z17421 Hormone receptor negative with human epidermal growth factor receptor 2 negative status: Secondary | ICD-10-CM | POA: Diagnosis not present

## 2024-09-07 DIAGNOSIS — D61818 Other pancytopenia: Secondary | ICD-10-CM | POA: Diagnosis present

## 2024-09-07 LAB — CBC
HCT: 35.5 % — ABNORMAL LOW (ref 36.0–46.0)
Hemoglobin: 12.4 g/dL (ref 12.0–15.0)
MCH: 27.4 pg (ref 26.0–34.0)
MCHC: 34.9 g/dL (ref 30.0–36.0)
MCV: 78.4 fL — ABNORMAL LOW (ref 80.0–100.0)
Platelets: 93 K/uL — ABNORMAL LOW (ref 150–400)
RBC: 4.53 MIL/uL (ref 3.87–5.11)
RDW: 13.1 % (ref 11.5–15.5)
WBC: 2.1 K/uL — ABNORMAL LOW (ref 4.0–10.5)
nRBC: 0 % (ref 0.0–0.2)

## 2024-09-07 LAB — GLUCOSE, CAPILLARY
Glucose-Capillary: 140 mg/dL — ABNORMAL HIGH (ref 70–99)
Glucose-Capillary: 174 mg/dL — ABNORMAL HIGH (ref 70–99)

## 2024-09-07 LAB — BASIC METABOLIC PANEL WITH GFR
Anion gap: 15 (ref 5–15)
BUN: 16 mg/dL (ref 6–20)
CO2: 18 mmol/L — ABNORMAL LOW (ref 22–32)
Calcium: 8.6 mg/dL — ABNORMAL LOW (ref 8.9–10.3)
Chloride: 88 mmol/L — ABNORMAL LOW (ref 98–111)
Creatinine, Ser: 0.52 mg/dL (ref 0.44–1.00)
GFR, Estimated: >60 mL/min
Glucose, Bld: 148 mg/dL — ABNORMAL HIGH (ref 70–99)
Potassium: 4.1 mmol/L (ref 3.5–5.1)
Sodium: 121 mmol/L — ABNORMAL LOW (ref 135–145)

## 2024-09-07 LAB — HEMOGLOBIN A1C
Hgb A1c MFr Bld: 7.9 % — ABNORMAL HIGH (ref 4.8–5.6)
Mean Plasma Glucose: 180.03 mg/dL

## 2024-09-07 MED ORDER — SODIUM BICARBONATE 650 MG PO TABS
650.0000 mg | ORAL_TABLET | Freq: Two times a day (BID) | ORAL | Status: DC
  Administered 2024-09-07 – 2024-09-08 (×4): 650 mg via ORAL
  Filled 2024-09-07 (×4): qty 1

## 2024-09-07 MED ORDER — LINAGLIPTIN 5 MG PO TABS
2.5000 mg | ORAL_TABLET | Freq: Two times a day (BID) | ORAL | Status: DC
  Administered 2024-09-08 – 2024-09-11 (×7): 2.5 mg via ORAL
  Filled 2024-09-07 (×8): qty 1

## 2024-09-07 MED ORDER — DAPAGLIFLOZIN PROPANEDIOL 10 MG PO TABS
10.0000 mg | ORAL_TABLET | Freq: Every day | ORAL | Status: DC
  Administered 2024-09-07 – 2024-09-11 (×4): 10 mg via ORAL
  Filled 2024-09-07 (×5): qty 1

## 2024-09-07 MED ORDER — SITAGLIP PHOS-METFORMIN HCL ER 50-1000 MG PO TB24: 1.0000 | ORAL_TABLET | Freq: Two times a day (BID) | ORAL | Status: DC

## 2024-09-07 MED ORDER — METFORMIN HCL ER 500 MG PO TB24
500.0000 mg | ORAL_TABLET | Freq: Two times a day (BID) | ORAL | Status: DC
  Administered 2024-09-08: 500 mg via ORAL
  Filled 2024-09-07 (×2): qty 1

## 2024-09-07 NOTE — Progress Notes (Addendum)
 TOC Brief note  Patient with a history of breast cancer in active treatment, admitted with n/v/d. Noted to have electrolyte abnormalities.  The medical record has been reviewed. The patient is from home. The patient has a payor and PCP on record. No SDOH interventions needed.   No recs at this time, please outreach to [facility] if needs are identified.

## 2024-09-07 NOTE — Progress Notes (Signed)
 " PROGRESS NOTE    Tina Carrillo  FMW:[id number] DOB: Nov 16, 1970 DOA: 09/06/2024 PCP: Harvey Gaetana CROME, NP    Brief Narrative:  54 y.o. female with medical history significant of congenital adrenal hyperplasia, breast cancer on active chemotherapy who presents to the hospital with a 3 to 4-day history of intractable nausea and vomiting associated with diarrhea.  Patient presented to the cancer center on 1/12 and was noted to P very weak and tremulous.  Vital signs were significant for elevated pulse rate of 137.  Remainder vital signs within normal limits.   Direct admission to the hospital was requested by the oncology service.  When I evaluated the patient at bedside she is resting in bed.  She appears fatigued.  Sister at bedside.  Patient answers all questions appropriately.  Confirms 3 to 4 days of diarrhea that immediately followed a chemotherapeutic treatment.    Assessment & Plan:   Principal Problem:   Hyponatremia  Hyponatremia Hypochloremia Appears fairly acute.  Last metabolic panel in 09/03/2024 was significant for mild hyponatremia at 127.  Slowly correcting from 118-121 Plan: Inpatient Continue telemetry monitoring Continue normal saline at 100 cc/h Recheck sodium level in a.m.  Tachycardia Appears sinus and relatively asymptomatic Continue to suspect volume depletion in the setting of diarrhea and intractable nausea and vomiting.  2D echocardiogram done in December 2025 overall reassuring.  Grade 1 diastolic dysfunction with no valvulopathy and normal ejection fraction. Plan: IV fluid resuscitation as above Telemetry monitoring   Intractable nausea and vomiting Diarrhea This started approximately 3 to 4 days ago after patient's last round of chemotherapy.  At this time I have relatively low suspicion for infectious etiology. Plan: As needed Lomotil  4 times daily As needed Phenergan  As needed Zofran  for refractory nausea and vomiting Regular diet as  tolerated    Congenital adrenal hyperplasia Chronic adrenal insufficiency No indication of adrenal crisis Vital signs reassuring Plan: Home regimen Florinef  0.1 mg every morning Prednisone  5 to 10 mg per home regimen   Breast cancer On active chemotherapy Outpatient oncology follow-up   Hypertension Hold home antihypertensives for now   Anxiety PTA Xanax    Hyperlipidemia Hold statin   Diabetes mellitus with hyperglycemia Hemoglobin A1c 7.9 Plan: Hold oral agents Sliding scale coverage for now      DVT prophylaxis: SQL Code Status: Full Family Communication: Sister at bedside 1/12 Disposition Plan: Status is: Inpatient Remains inpatient appropriate because: Hyponatremia, tachycardia   Level of care: Telemetry  Consultants:  None  Procedures:  None  Antimicrobials: None   Subjective: Seen and examined peer resting in bed.  Continues to endorse fatigue and loose stools.  Objective: Vitals:   09/06/24 2001 09/07/24 0047 09/07/24 0420 09/07/24 0738  BP: 115/85 101/77 108/72 96/72  Pulse: (!) 135 (!) 126 (!) 128 (!) 126  Resp: 19 18 16 16   Temp: (!) 97.4 F (36.3 C) 99.1 F (37.3 C) 98.6 F (37 C) 99.1 F (37.3 C)  TempSrc: Oral Oral Oral Oral  SpO2: 100% 96% 99% 97%    Intake/Output Summary (Last 24 hours) at 09/07/2024 1202 Last data filed at 09/07/2024 0300 Gross per 24 hour  Intake 841.63 ml  Output --  Net 841.63 ml   There were no vitals filed for this visit.  Examination:  General exam: NAD Respiratory system: Clear to auscultation. Respiratory effort normal. Cardiovascular system: S1-S2, tachycardic, regular rhythm, no murmurs Gastrointestinal system: Soft, NT/ND, normal bowel sounds Central nervous system: Alert and oriented. No focal neurological deficits.  Extremities: Symmetric 5 x 5 power. Skin: No rashes, lesions or ulcers Psychiatry: Judgement and insight appear normal. Mood & affect appropriate.     Data Reviewed: I  have personally reviewed following labs and imaging studies  CBC: Recent Labs  Lab 09/03/24 1435 09/06/24 0840 09/07/24 0431  WBC 18.3* 3.9* 2.1*  NEUTROABS 15.6* 2.3  --   HGB 15.3* 13.7 12.4  HCT 44.9 40.0 35.5*  MCV 80.5 78.7* 78.4*  PLT 203 116* 93*   Basic Metabolic Panel: Recent Labs  Lab 09/03/24 1435 09/06/24 0840 09/07/24 0431  NA 127* 118* 121*  K 4.3 3.9 4.1  CL 92* 83* 88*  CO2 17* 19* 18*  GLUCOSE 250* 311* 148*  BUN 23* 20 16  CREATININE 0.62 0.64 0.52  CALCIUM 10.2 10.0 8.6*  MG 2.1  --   --    GFR: Estimated Creatinine Clearance: 73.2 mL/min (by C-G formula based on SCr of 0.52 mg/dL). Liver Function Tests: Recent Labs  Lab 09/03/24 1435 09/06/24 0840  AST 17 38  ALT 36 61*  ALKPHOS 87 81  BILITOT 0.9 0.6  PROT 7.9 7.3  ALBUMIN 4.8 4.4   No results for input(s): LIPASE, AMYLASE in the last 168 hours. No results for input(s): AMMONIA in the last 168 hours. Coagulation Profile: No results for input(s): INR, PROTIME in the last 168 hours. Cardiac Enzymes: No results for input(s): CKTOTAL, CKMB, CKMBINDEX, TROPONINI in the last 168 hours. BNP (last 3 results) No results for input(s): PROBNP in the last 8760 hours. HbA1C: Recent Labs    09/07/24 0431  HGBA1C 7.9*   CBG: Recent Labs  Lab 09/06/24 1627 09/06/24 2002 09/07/24 0742  GLUCAP 215* 167* 140*   Lipid Profile: No results for input(s): CHOL, HDL, LDLCALC, TRIG, CHOLHDL, LDLDIRECT in the last 72 hours. Thyroid  Function Tests: No results for input(s): TSH, T4TOTAL, FREET4, T3FREE, THYROIDAB in the last 72 hours. Anemia Panel: No results for input(s): VITAMINB12, FOLATE, FERRITIN, TIBC, IRON, RETICCTPCT in the last 72 hours. Sepsis Labs: No results for input(s): PROCALCITON, LATICACIDVEN in the last 168 hours.  No results found for this or any previous visit (from the past 240 hours).       Radiology Studies: No  results found.      Scheduled Meds:  ALPRAZolam   0.25 mg Oral QHS   enoxaparin  (LOVENOX ) injection  40 mg Subcutaneous Q24H   fludrocortisone   0.1 mg Oral q AM   insulin  aspart  0-9 Units Subcutaneous TID WC   predniSONE   10 mg Oral Q breakfast   And   predniSONE   5 mg Oral Q supper   sodium bicarbonate   650 mg Oral BID   Continuous Infusions:  sodium chloride  100 mL/hr at 09/07/24 0443   promethazine  (PHENERGAN ) injection (IM or IVPB)       LOS: 1 day      Calvin KATHEE Robson, MD [facility]   If 7PM-7AM, please contact night-coverage  09/07/2024, 12:02 PM   "

## 2024-09-07 NOTE — Plan of Care (Signed)

## 2024-09-07 NOTE — Progress Notes (Signed)
 " [facility]  [phone]) [phone] [phone]) [phone]  ID: Tina Carrillo OB: 20-Jan-1971  MR#: [id number]  CSN#:[id number]  [name], NP as PCP - General (Family Medicine) Georgina Shasta POUR, RN as Oncology Nurse Navigator [name] [name], MD as Consulting Physician (Oncology)  CHIEF COMPLAINT: Pathologic stage Ib triple negative invasive carcinoma of right breast.  INTERVAL HISTORY: Patient seen in symptom management clinic with reports of diarrhea, nausea and vomiting that started the day after her first cycle of adriamycin  and cytoxan  on 08/30/24.  She presents to clinic today with her Sister.  Very weak with a wet rag on her head.  Tachycardic.  She reports that she has been having persistent nausea/vomiting and diarrhea for about 2 days.  She reports that they diarrhea has improved today.  She has not been able to eat or drink much and had a hard time keeping medications down.  She has been taking her prednisone  and florinef  for her adrenal hyperplasia however couldn't take DM meds today.  Patient reports 3-4 loose stools daily, none today.    REVIEW OF SYSTEMS:   Review of Systems  Constitutional: Negative.  Negative for fever, malaise/fatigue and weight loss.  Respiratory: Negative.  Negative for cough, hemoptysis and shortness of breath.   Cardiovascular: Negative.  Negative for chest pain and leg swelling.  Gastrointestinal:  Positive for diarrhea, nausea and vomiting. Negative for abdominal pain.  Genitourinary: Negative.  Negative for dysuria.  Musculoskeletal: Negative.  Negative for back pain.  Skin: Negative.  Negative for rash.  Neurological: Negative.  Negative for dizziness, focal weakness, weakness and headaches.  Psychiatric/Behavioral:  The patient is nervous/anxious.     As per HPI. Otherwise, a complete review of systems is negative.  PAST MEDICAL HISTORY: Past Medical History:  Diagnosis Date   Adrenal benign tumor     Adrenal hyperplasia    Anxiety    Diabetes mellitus without complication (HCC)    Ductal carcinoma in situ (DCIS) of right breast    GERD (gastroesophageal reflux disease)    History of repair of congenital cleft palate    Leukocytosis    a.) in setting of chronic corticosteroid use; b.) seen in consult by hematology 2019 --> flow cytometry revealed no immunophenotypic abnormality (no monocolon B cell population or neoplastic T cell process)   PONV (postoperative nausea and vomiting)    Sleep apnea    a.) unable to afford DME required for nocturnal PAP    PAST SURGICAL HISTORY: Past Surgical History:  Procedure Laterality Date   AXILLARY SENTINEL NODE BIOPSY Right 07/02/2024   Procedure: BIOPSY, LYMPH NODE, SENTINEL, AXILLARY;  Surgeon: Tye Millet, DO;  Location: [facility];  Service: General;  Laterality: Right;   BREAST BIOPSY Right 06/08/2024   US  RT BREAST BX W LOC DEV 1ST LESION IMG BX SPEC US  GUIDE 06/08/2024 [facility]   BREAST BIOPSY Right 06/22/2024   US  RT BREAST SAVI/RF TAG 1ST LESION US  GUIDE 06/22/2024 [facility]   BREAST LUMPECTOMY WITH RADIO FREQUENCY LOCALIZER Right 07/02/2024   Procedure: BREAST LUMPECTOMY WITH RADIO FREQUENCY LOCALIZER;  Surgeon: Tye Millet, DO;  Location: [facility];  Service: General;  Laterality: Right;   CHOLECYSTECTOMY     CLEFT PALATE REPAIR     MYRINGOTOMY WITH TUBE PLACEMENT Bilateral 06/25/2023   Procedure: MYRINGOTOMY WITH TUBE PLACEMENT (BUTTERFLY TUBES);  Surgeon: [name], MD;  Location: [facility];  Service: ENT;  Laterality: Bilateral;   PORTACATH PLACEMENT Left 08/17/2024  Procedure: INSERTION, TUNNELED CENTRAL VENOUS DEVICE, WITH PORT;  Surgeon: Tye Millet, DO;  Location: [facility];  Service: General;  Laterality: Left;   removal of right adrenal gland  2024   done at [facility]   VAGINA RECONSTRUCTION SURGERY  1985    FAMILY HISTORY: Family History  Problem Relation Age of Onset   Ovarian cancer  Mother    Bipolar disorder Mother    Diabetes Mother    Uterine cancer Sister    Breast cancer Sister 31       mat half sister   Hypertension Father    Heart disease Father    Diabetes Maternal Grandmother    Brain cancer Paternal Grandmother     ADVANCED DIRECTIVES (Y/N):  N  HEALTH MAINTENANCE: Social History   Tobacco Use   Smoking status: Former    Current packs/day: 0.00    Types: Cigarettes    Quit date: 2018    Years since quitting: 8.0   Smokeless tobacco: Never  Vaping Use   Vaping status: Never Used  Substance Use Topics   Alcohol use: Yes    Alcohol/week: 1.0 standard drink of alcohol    Types: 1 Cans of beer per week    Comment: rarely   Drug use: No     Colonoscopy:  PAP:  Bone density:  Lipid panel:  Allergies  Allergen Reactions   Benzodiazepines Hives   Tramadol  Nausea And Vomiting   Morphine  And Codeine  Nausea And Vomiting   Nucynta [Tapentadol] Nausea And Vomiting   Oxycodone  Itching    No current facility-administered medications for this visit.   No current outpatient medications on file.   Facility-Administered Medications Ordered in Other Visits  Medication Dose Route Frequency Provider Last Rate Last Admin   0.9 %  sodium chloride  infusion   Intravenous Continuous Jhonny Sahara B, MD 100 mL/hr at 09/07/24 0443 New Bag at 09/07/24 0443   acetaminophen  (TYLENOL ) tablet 500-1,000 mg  500-1,000 mg Oral Q6H PRN Sreenath, Sudheer B, MD       albuterol  (PROVENTIL ) (2.5 MG/3ML) 0.083% nebulizer solution 2.5 mg  2.5 mg Nebulization Q2H PRN Jhonny, Sudheer B, MD       ALPRAZolam  (XANAX ) tablet 0.25 mg  0.25 mg Oral QHS Sreenath, Sudheer B, MD   0.25 mg at 09/06/24 2145   cyclobenzaprine  (FLEXERIL ) tablet 5-10 mg  5-10 mg Oral TID PRN Sreenath, Sudheer B, MD       diphenoxylate -atropine  (LOMOTIL ) 2.5-0.025 MG per tablet 1 tablet  1 tablet Oral QID PRN Sreenath, Sudheer B, MD   1 tablet at 09/07/24 0441   enoxaparin  (LOVENOX ) injection 40  mg  40 mg Subcutaneous Q24H Sreenath, Sudheer B, MD   40 mg at 09/06/24 1835   fludrocortisone  (FLORINEF ) tablet 0.1 mg  0.1 mg Oral q AM Sreenath, Sudheer B, MD   0.1 mg at 09/07/24 0603   fluticasone  (FLONASE ) 50 MCG/ACT nasal spray 1 spray  1 spray Each Nare Daily PRN Sreenath, Sudheer B, MD       HYDROcodone -acetaminophen  (NORCO/VICODIN) 5-325 MG per tablet 1 tablet  1 tablet Oral Q8H PRN Sreenath, Sudheer B, MD       ibuprofen  (ADVIL ) tablet 400 mg  400 mg Oral Q8H PRN Sreenath, Sudheer B, MD       insulin  aspart (novoLOG ) injection 0-9 Units  0-9 Units Subcutaneous TID WC Sreenath, Sudheer B, MD       ondansetron  (ZOFRAN ) injection 4 mg  4 mg Intravenous Q6H PRN Sreenath, Sudheer B,  MD   4 mg at 09/06/24 1835   predniSONE  (DELTASONE ) tablet 10 mg  10 mg Oral Q breakfast Jhonny Sahara B, MD   10 mg at 09/07/24 9393   And   predniSONE  (DELTASONE ) tablet 5 mg  5 mg Oral Q supper Sreenath, Sudheer B, MD   5 mg at 09/06/24 1836   promethazine  (PHENERGAN ) 12.5 mg in sodium chloride  0.9 % 50 mL IVPB  12.5 mg Intravenous Q6H PRN Jhonny Sahara B, MD       sodium bicarbonate  tablet 650 mg  650 mg Oral BID Jhonny Sahara B, MD   650 mg at 09/07/24 1001    OBJECTIVE: Vitals:   09/03/24 1438 09/03/24 1550  BP: 129/78 (!) 123/47  Pulse: (!) 137 (!) 113  Resp: 20 18  Temp: 97.7 F (36.5 C)   SpO2: 100%       Body mass index is 35.41 kg/m.    ECOG FS:0 - Asymptomatic  General: Well-developed, well-nourished, no acute distress. Eyes: Pink conjunctiva, anicteric sclera. HEENT: Normocephalic, moist mucous membranes. Lungs: No audible wheezing or coughing. Heart: regular tachy. Abdomen: Soft, nontender, no obvious distention. Musculoskeletal: No edema, cyanosis, or clubbing. Neuro: Alert, answering all questions appropriately. Cranial nerves grossly intact. Skin: No rashes or petechiae noted. Psych: Normal affect.  LAB RESULTS:        Component Ref Range & Units (hover) 4 d  ago 8 yr ago  Magnesium 2.1                Component Ref Range & Units (hover) 4 d ago (09/03/24) 8 d ago (08/30/24) 2 mo ago (06/28/24) 6 yr ago (09/08/18) 6 yr ago (05/19/18) 8 yr ago (04/27/16) 8 yr ago (01/08/16) 8 yr ago (01/08/16)  Sodium 127 Low  128 Low  135 140 136 135  136  Potassium 4.3 4.3 4.4 3.8 3.8 3.8  3.2 Low   Chloride 92 Low  90 Low  97 Low  102 101 102 R  102 R  CO2 17 Low  19 Low  26 28 24 24  22   Glucose, Bld 250 High  219 High  CM 182 High  CM 146 High  133 High  183 High  R  129 High  R  Comment: Glucose reference range applies only to samples taken after fasting for at least 8 hours.  BUN 23 High  21 High  22 High  17 25 High  20  17  Creatinine 0.62 0.77 0.71 0.75 0.79 0.70  0.67  Calcium 10.2 10.5 High  9.4 9.4 9.1 9.4  9.7  Total Protein 7.9 8.3 High    7.8 7.9 7.7   Albumin 4.8 5.0   4.4 4.5 4.5   AST 17 32   21 25 23    ALT 36 57 High    28 34 R 28 R   Alkaline Phosphatase 87 94   71 81 81   Total Bilirubin 0.9 0.5   0.4 R 0.6 R 0.6 R   GFR, Estimated >60 >60 CM  >60 >60 >60  >60  Comment: (NOTE) Calculated using the CKD-EPI Creatinine Equation (2021)  Anion gap 19 High  19 High  CM 12                   Component Ref Range & Units (hover) 4 d ago (09/03/24) 8 d ago (08/30/24) 2 mo ago (06/28/24) 6 yr ago (09/08/18) 6 yr ago (06/04/18) 6 yr ago (05/19/18) 8 yr  ago (04/27/16)  WBC Count 18.3 High  24.3 High  18.2 High  15.1 High  14.7 High  18.7 High  R 13.9 High  R  RBC 5.58 High  5.46 High  4.92 5.15 High  5.25 High  5.33 High  R 5.18 R  Hemoglobin 15.3 High  14.7 13.3 14.5 14.3 14.8 R 14.1 R  HCT 44.9 45.0 41.4 44.6 45.2 44.6 R 42.1 R  MCV 80.5 82.4 84.1 86.6 86.1 83.7 81.3  MCH 27.4 26.9 27.0 28.2 27.2 27.7 27.3  MCHC 34.1 32.7 32.1 32.5 31.6 33.1 R 33.6 R  RDW 13.6 14.1 14.1 14.1 14.2 14.5 R 14.3 R  Platelet Count 203 375 306 295 287 322 R 261 R  nRBC 0.0 0.0 0.0 CM 0.0 0.0    Neutrophils Relative % 86 71  77 84 72   Neutro Abs 15.6 High  17.3 High    11.6 High  12.3 High  13.6 High  R   Lymphocytes Relative 12 19  16 11 21    Lymphs Abs 2.2 4.6 High   2.5 1.6 3.8 High  R   Monocytes Relative 1 6  5 4 5    Monocytes Absolute 0.1 1.5 High   0.8 0.6 1.0 High  R   Eosinophils Relative 0 0  1 0 1   Eosinophils Absolute 0.0 0.1  0.1 0.0 0.1 R   Basophils Relative 0 1  0 0 1   Basophils Absolute 0.1 0.1  0.0 0.1 0.2 High  R, CM   Immature Granulocytes 1 3  1 1     Abs Immature Granulocytes 0.24 High  0.78 High  CM  0.12 High  CM          STUDIES: DG CHEST PORT 1 VIEW Result Date: 08/17/2024 CLINICAL DATA:  Port-A-Cath none EXAM: PORTABLE CHEST 1 VIEW COMPARISON:  01/08/2016 FINDINGS: Left-sided central venous port with tip projecting over the low right atrium/right atrial IVC junction. Marked hypoventilatory change results in exaggeration of the cardiac size, enlarged mediastinal silhouette likely due to low lung volume. Aortic atherosclerosis. Bibasilar atelectasis. No pneumothorax IMPRESSION: 1. Left-sided central venous port with tip projecting over the low right atrium/right atrial IVC junction. No pneumothorax. 2. Marked hypoventilatory change with bibasilar atelectasis. Electronically Signed   By: Luke Bun M.D.   On: 08/17/2024 16:45   DG C-Arm 1-60 Min-No Report Result Date: 08/17/2024 Fluoroscopy was utilized by the requesting physician.  No radiographic interpretation.     ASSESSMENT: Pathologic stage Ib triple negative invasive carcinoma of right breast.  PLAN:    Nausea/Vomiting/Dehydration: Secondary to chemo treatment.  Proceed with IVF bolus today with IV compazine   2.   Congenital adrenal hyperplasia/diabetes: Continue follow-up and treatment per endocrinology. She reports that she has been able to take her steroids 3.   Hyponatremia: Patient's sodium level is 127 today secondary to adrenal hyperplasia/diabetes and dehydration.  Corrected Na using glucose was 129 proceed with IVF bolus today to help correct dehydration  return to clinic Monday see Dr. [name] with repeat labs. 4.   Diarrhea: improved continue to utilize imodium  proceed with ivf bolus today Mg level WNL 5.   Leukocytosis: improved slightly today likely reactive no fever/chills continue to monitor closely 6.   Tachycardia: Secondary to dehydration proceed with IVF bolus recheck vitals for improvement post infusion   Patient expressed understanding and was in agreement with this plan. She also understands that She can call clinic at any time with any questions, concerns, or  complaints.    Cancer Staging  Invasive ductal carcinoma of right breast [facility]) Staging form: Breast, AJCC 8th Edition - Pathologic stage from 07/22/2024: Stage IB (pT1b, pN0, cM0, G3, ER-, PR-, HER2-) - Signed by [name] [name], MD on 07/22/2024 Stage prefix: Initial diagnosis Histologic grading system: 3 grade system    Follow up plan:  F/U per [facility]    Morna Husband, NP   09/07/2024 10:47 AM     "

## 2024-09-08 ENCOUNTER — Telehealth: Payer: Self-pay | Admitting: *Deleted

## 2024-09-08 ENCOUNTER — Other Ambulatory Visit: Payer: Self-pay

## 2024-09-08 DIAGNOSIS — E871 Hypo-osmolality and hyponatremia: Secondary | ICD-10-CM | POA: Diagnosis not present

## 2024-09-08 LAB — MAGNESIUM: Magnesium: 1.7 mg/dL (ref 1.7–2.4)

## 2024-09-08 LAB — CBC WITH DIFFERENTIAL/PLATELET
Abs Immature Granulocytes: 0.01 K/uL (ref 0.00–0.07)
Basophils Absolute: 0 K/uL (ref 0.0–0.1)
Basophils Relative: 2 %
Eosinophils Absolute: 0 K/uL (ref 0.0–0.5)
Eosinophils Relative: 0 %
HCT: 32.9 % — ABNORMAL LOW (ref 36.0–46.0)
Hemoglobin: 11.2 g/dL — ABNORMAL LOW (ref 12.0–15.0)
Immature Granulocytes: 2 %
Lymphocytes Relative: 75 %
Lymphs Abs: 0.5 K/uL — ABNORMAL LOW (ref 0.7–4.0)
MCH: 27 pg (ref 26.0–34.0)
MCHC: 34 g/dL (ref 30.0–36.0)
MCV: 79.3 fL — ABNORMAL LOW (ref 80.0–100.0)
Monocytes Absolute: 0 K/uL — ABNORMAL LOW (ref 0.1–1.0)
Monocytes Relative: 6 %
Neutro Abs: 0.1 K/uL — CL (ref 1.7–7.7)
Neutrophils Relative %: 15 %
Platelets: 86 K/uL — ABNORMAL LOW (ref 150–400)
RBC: 4.15 MIL/uL (ref 3.87–5.11)
RDW: 13.1 % (ref 11.5–15.5)
Smear Review: NORMAL
WBC: 0.7 K/uL — CL (ref 4.0–10.5)
nRBC: 0 % (ref 0.0–0.2)

## 2024-09-08 LAB — BASIC METABOLIC PANEL WITH GFR
Anion gap: 11 (ref 5–15)
BUN: 8 mg/dL (ref 6–20)
CO2: 21 mmol/L — ABNORMAL LOW (ref 22–32)
Calcium: 8.8 mg/dL — ABNORMAL LOW (ref 8.9–10.3)
Chloride: 97 mmol/L — ABNORMAL LOW (ref 98–111)
Creatinine, Ser: 0.56 mg/dL (ref 0.44–1.00)
GFR, Estimated: >60 mL/min
Glucose, Bld: 128 mg/dL — ABNORMAL HIGH (ref 70–99)
Potassium: 4.2 mmol/L (ref 3.5–5.1)
Sodium: 129 mmol/L — ABNORMAL LOW (ref 135–145)

## 2024-09-08 LAB — PHOSPHORUS: Phosphorus: 3.4 mg/dL (ref 2.5–4.6)

## 2024-09-08 LAB — GLUCOSE, CAPILLARY
Glucose-Capillary: 107 mg/dL — ABNORMAL HIGH (ref 70–99)
Glucose-Capillary: 128 mg/dL — ABNORMAL HIGH (ref 70–99)
Glucose-Capillary: 154 mg/dL — ABNORMAL HIGH (ref 70–99)
Glucose-Capillary: 139 mg/dL — ABNORMAL HIGH (ref 70–99)

## 2024-09-08 LAB — VITAMIN B12: Vitamin B-12: 640 pg/mL (ref 180–914)

## 2024-09-08 LAB — FOLATE: Folate: >20 ng/mL

## 2024-09-08 LAB — OSMOLALITY: Osmolality: 268 mosm/kg — ABNORMAL LOW (ref 275–295)

## 2024-09-08 LAB — IRON AND TIBC
Iron: 127 ug/dL (ref 28–170)
Saturation Ratios: 47 % — ABNORMAL HIGH (ref 10.4–31.8)
TIBC: 269 ug/dL (ref 250–450)
UIBC: 142 ug/dL

## 2024-09-08 LAB — VITAMIN D 25 HYDROXY (VIT D DEFICIENCY, FRACTURES): Vit D, 25-Hydroxy: 19.4 ng/mL — ABNORMAL LOW (ref 30–100)

## 2024-09-08 MED ORDER — VITAMIN D (ERGOCALCIFEROL) 1.25 MG (50000 UNIT) PO CAPS
50000.0000 [IU] | ORAL_CAPSULE | ORAL | Status: DC
  Administered 2024-09-08: 50000 [IU] via ORAL
  Filled 2024-09-08: qty 1

## 2024-09-08 MED ORDER — SACCHAROMYCES BOULARDII 250 MG PO CAPS
250.0000 mg | ORAL_CAPSULE | Freq: Two times a day (BID) | ORAL | Status: DC
  Administered 2024-09-08 – 2024-09-11 (×6): 250 mg via ORAL
  Filled 2024-09-08 (×7): qty 1

## 2024-09-08 MED ORDER — SODIUM CHLORIDE 0.9 % IV SOLN: INTRAVENOUS | Status: DC

## 2024-09-08 MED ORDER — FILGRASTIM-AAFI 480 MCG/0.8ML IJ SOSY
480.0000 ug | PREFILLED_SYRINGE | Freq: Every day | INTRAMUSCULAR | Status: DC
  Administered 2024-09-08 – 2024-09-11 (×4): 480 ug via SUBCUTANEOUS
  Filled 2024-09-08 (×4): qty 0.8

## 2024-09-08 MED ORDER — INSULIN ASPART 100 UNIT/ML IJ SOLN
0.0000 [IU] | Freq: Three times a day (TID) | INTRAMUSCULAR | Status: DC
  Administered 2024-09-08 – 2024-09-09 (×2): 3 [IU] via SUBCUTANEOUS
  Administered 2024-09-09 – 2024-09-10 (×2): 2 [IU] via SUBCUTANEOUS
  Administered 2024-09-10 – 2024-09-11 (×2): 3 [IU] via SUBCUTANEOUS
  Filled 2024-09-08 (×3): qty 3
  Filled 2024-09-08: qty 2
  Filled 2024-09-08 (×2): qty 3

## 2024-09-08 NOTE — Telephone Encounter (Signed)
 Caller verified using pt's full name and dob prior to discussing PHI    Patient called cancer center triage to see when Dr. [name] would round on her in the hospital. I explained to her that oncology needs to be consulted first by the inpatient providers. I would let Dr. [name] and his team know that she called. She thanked me for speaking to her and hung up.

## 2024-09-08 NOTE — Plan of Care (Signed)

## 2024-09-08 NOTE — Plan of Care (Signed)

## 2024-09-08 NOTE — Progress Notes (Signed)
 [facility] Progress Note  Patient: Tina Carrillo    FMW:[id number]  DOA: 09/06/2024     Date of Service: the patient was seen and examined on 09/08/2024  No chief complaint on file.  Brief hospital course: 54 y.o. female with medical history significant of congenital adrenal hyperplasia, breast cancer on active chemotherapy who presents to the hospital with a 3 to 4-day history of intractable nausea and vomiting associated with diarrhea.  Patient presented to the cancer center on 1/12 and was noted to P very weak and tremulous.  Vital signs were significant for elevated pulse rate of 137.  Remainder vital signs within normal limits.   Direct admission to the hospital was requested by the oncology service.  When I evaluated the patient at bedside she is resting in bed.  She appears fatigued.  Sister at bedside.  Patient answers all questions appropriately.  Confirms 3 to 4 days of diarrhea that immediately followed a chemotherapeutic treatment.    Assessment and Plan:  Principal Problem:   Hyponatremia     # Hypotonic hyponatremia # Hypochloremia Appears fairly acute.  Last metabolic panel in 09/03/2024 was significant for mild hyponatremia at 127.  Slowly correcting from 118-121 Serum osmolality 268, low Plan: Inpatient Continue telemetry monitoring Continue normal saline at 100 cc/h Recheck sodium level in a.m.    # Leukopenia with neutropenia Wbc 0.7 ANC 0.1 1/14 started Zarxio  480 mcg subcu daily until ANC >1000 Following collagen  # Thrombocytopenia Plts 86k > Monitor CBC daily   Tachycardia Appears sinus and relatively asymptomatic Continue to suspect volume depletion in the setting of diarrhea and intractable nausea and vomiting.  2D echocardiogram done in December 2025 overall reassuring.  Grade 1 diastolic dysfunction with no valvulopathy and normal ejection fraction. Plan: IV fluid resuscitation as above Telemetry monitoring   Intractable nausea and  vomiting Diarrhea This started approximately 3 to 4 days ago after patient's last round of chemotherapy.  At this time I have relatively low suspicion for infectious etiology. Plan: As needed Lomotil  4 times daily As needed Phenergan  As needed Zofran  for refractory nausea and vomiting Regular diet as tolerated 1/14 check C. difficile and GI pathogen Started probiotic   Congenital adrenal hyperplasia Chronic adrenal insufficiency No indication of adrenal crisis Vital signs reassuring Plan: Home regimen Florinef  0.1 mg every morning Prednisone  5 to 10 mg per home regimen   Breast cancer On active chemotherapy Outpatient oncology follow-up   Hypertension Hold home antihypertensives for now   Anxiety PTA Xanax    Hyperlipidemia Hold statin   Diabetes mellitus with hyperglycemia Hemoglobin A1c 7.9 1/14 discontinued metformin  for now Continue Tradjenta  2.5 mg p.o. twice daily Continue NovoLog  sliding's    Vitamin D  deficiency: started vitamin D  50,000 units p.o. weekly, follow with PCP to repeat vitamin D  level after 3 to 6 months.     There is no height or weight on file to calculate BMI.  Interventions:  Diet: Carb modified diet DVT Prophylaxis: SCDs due to thrombocytopenia  Advance goals of care discussion: Full code  Family Communication: family was not present at bedside, at the time of interview.  The pt provided permission to discuss medical plan with the family. Opportunity was given to ask question and all questions were answered satisfactorily.   Disposition:  Pt is from Home, admitted with hyponatremia, still has low sodium, which precludes a safe discharge. Discharge to home, when stable, may need few days to improve.  Subjective: No significant events overnight.  Patient  denies any nausea vomiting today but had 3 episodes of diarrhea.  No abdominal pain.  Denied any other complaints.  Physical Exam: General: NAD, lying comfortably Appear in no  distress, affect appropriate Eyes: PERRLA ENT: Oral Mucosa Clear, moist  Neck: no JVD,  Cardiovascular: S1 and S2 Present, no Murmur,  Respiratory: good respiratory effort, Bilateral Air entry equal and Decreased, no Crackles, no wheezes Abdomen: Bowel Sound present, Soft and no tenderness,  Skin: no rashes Extremities: no Pedal edema, no calf tenderness Neurologic: without any new focal findings Gait not checked due to patient safety concerns  Vitals:   09/08/24 0104 09/08/24 0546 09/08/24 0731 09/08/24 1135  BP: 98/75 107/71 106/75 103/69  Pulse: (!) 116 (!) 119 (!) 116 95  Resp: 17 18 17 16   Temp: (!) 97.5 F (36.4 C) 98.6 F (37 C) 98.3 F (36.8 C) 98.7 F (37.1 C)  TempSrc: Oral     SpO2: 99% 100% 100% 100%    Intake/Output Summary (Last 24 hours) at 09/08/2024 1546 Last data filed at 09/08/2024 1318 Gross per 24 hour  Intake 1728.22 ml  Output --  Net 1728.22 ml   There were no vitals filed for this visit.  Data Reviewed: I have personally reviewed and interpreted daily labs, tele strips, imagings as discussed above. I reviewed all nursing notes, pharmacy notes, vitals, pertinent old records I have discussed plan of care as described above with RN and patient/family.  CBC: Recent Labs  Lab 09/03/24 1435 09/06/24 0840 09/07/24 0431 09/08/24 1237  WBC 18.3* 3.9* 2.1* 0.7*  NEUTROABS 15.6* 2.3  --  0.1*  HGB 15.3* 13.7 12.4 11.2*  HCT 44.9 40.0 35.5* 32.9*  MCV 80.5 78.7* 78.4* 79.3*  PLT 203 116* 93* 86*   Basic Metabolic Panel: Recent Labs  Lab 09/03/24 1435 09/06/24 0840 09/07/24 0431 09/08/24 0414  NA 127* 118* 121* 129*  K 4.3 3.9 4.1 4.2  CL 92* 83* 88* 97*  CO2 17* 19* 18* 21*  GLUCOSE 250* 311* 148* 128*  BUN 23* 20 16 8   CREATININE 0.62 0.64 0.52 0.56  CALCIUM 10.2 10.0 8.6* 8.8*  MG 2.1  --   --  1.7  PHOS  --   --   --  3.4    Studies: No results found.  Scheduled Meds:  ALPRAZolam   0.25 mg Oral QHS   dapagliflozin  propanediol   10 mg Oral Daily   enoxaparin  (LOVENOX ) injection  40 mg Subcutaneous Q24H   filgrastim  (NIVESTYM ) SQ  480 mcg Subcutaneous Daily   fludrocortisone   0.1 mg Oral q AM   linagliptin   2.5 mg Oral BID AC   And   metFORMIN   500 mg Oral BID WC   predniSONE   10 mg Oral Q breakfast   And   predniSONE   5 mg Oral Q supper   sodium bicarbonate   650 mg Oral BID   Continuous Infusions:  sodium chloride  100 mL/hr at 09/08/24 1017   promethazine  (PHENERGAN ) injection (IM or IVPB)     PRN Meds: acetaminophen , albuterol , cyclobenzaprine , diphenoxylate -atropine , fluticasone , HYDROcodone -acetaminophen , ibuprofen , ondansetron  (ZOFRAN ) IV, promethazine  (PHENERGAN ) injection (IM or IVPB)  Time spent: 55 minutes  Author: ELVAN SOR. MD [facility] 09/08/2024 3:46 PM  To reach On-call, see care teams to locate the attending and reach out to them via [email or web address]. If 7PM-7AM, please contact night-coverage If you still have difficulty reaching the attending provider, please page the [facility] (Director on Call) for [facility] on amion for assistance.

## 2024-09-09 DIAGNOSIS — E871 Hypo-osmolality and hyponatremia: Secondary | ICD-10-CM | POA: Diagnosis not present

## 2024-09-09 LAB — CBC WITH DIFFERENTIAL/PLATELET
Abs Immature Granulocytes: 0 K/uL (ref 0.00–0.07)
Basophils Absolute: 0 K/uL (ref 0.0–0.1)
Basophils Relative: 0 %
Eosinophils Absolute: 0 K/uL (ref 0.0–0.5)
Eosinophils Relative: 1 %
HCT: 28.8 % — ABNORMAL LOW (ref 36.0–46.0)
Hemoglobin: 9.7 g/dL — ABNORMAL LOW (ref 12.0–15.0)
Immature Granulocytes: 0 %
Lymphocytes Relative: 85 %
Lymphs Abs: 0.9 K/uL (ref 0.7–4.0)
MCH: 27.3 pg (ref 26.0–34.0)
MCHC: 33.7 g/dL (ref 30.0–36.0)
MCV: 81.1 fL (ref 80.0–100.0)
Monocytes Absolute: 0.1 K/uL (ref 0.1–1.0)
Monocytes Relative: 7 %
Neutro Abs: 0.1 K/uL — CL (ref 1.7–7.7)
Neutrophils Relative %: 7 %
Platelets: 81 K/uL — ABNORMAL LOW (ref 150–400)
RBC: 3.55 MIL/uL — ABNORMAL LOW (ref 3.87–5.11)
RDW: 13.2 % (ref 11.5–15.5)
Smear Review: NORMAL
WBC: 1.1 K/uL — CL (ref 4.0–10.5)
nRBC: 0 % (ref 0.0–0.2)

## 2024-09-09 LAB — BASIC METABOLIC PANEL WITH GFR
Anion gap: 12 (ref 5–15)
BUN: 6 mg/dL (ref 6–20)
CO2: 23 mmol/L (ref 22–32)
Calcium: 8.7 mg/dL — ABNORMAL LOW (ref 8.9–10.3)
Chloride: 99 mmol/L (ref 98–111)
Creatinine, Ser: 0.47 mg/dL (ref 0.44–1.00)
GFR, Estimated: >60 mL/min
Glucose, Bld: 96 mg/dL (ref 70–99)
Potassium: 3.3 mmol/L — ABNORMAL LOW (ref 3.5–5.1)
Sodium: 133 mmol/L — ABNORMAL LOW (ref 135–145)

## 2024-09-09 LAB — GLUCOSE, CAPILLARY
Glucose-Capillary: 87 mg/dL (ref 70–99)
Glucose-Capillary: 158 mg/dL — ABNORMAL HIGH (ref 70–99)
Glucose-Capillary: 136 mg/dL — ABNORMAL HIGH (ref 70–99)
Glucose-Capillary: 167 mg/dL — ABNORMAL HIGH (ref 70–99)

## 2024-09-09 LAB — CBC
HCT: 28.5 % — ABNORMAL LOW (ref 36.0–46.0)
Hemoglobin: 9.7 g/dL — ABNORMAL LOW (ref 12.0–15.0)
MCH: 27.6 pg (ref 26.0–34.0)
MCHC: 34 g/dL (ref 30.0–36.0)
MCV: 81 fL (ref 80.0–100.0)
Platelets: 83 K/uL — ABNORMAL LOW (ref 150–400)
RBC: 3.52 MIL/uL — ABNORMAL LOW (ref 3.87–5.11)
RDW: 13.2 % (ref 11.5–15.5)
WBC: 1 K/uL — CL (ref 4.0–10.5)
nRBC: 0 % (ref 0.0–0.2)

## 2024-09-09 LAB — GASTROINTESTINAL PANEL BY PCR, STOOL (REPLACES STOOL CULTURE)

## 2024-09-09 LAB — MAGNESIUM: Magnesium: 1.7 mg/dL (ref 1.7–2.4)

## 2024-09-09 LAB — C DIFFICILE QUICK SCREEN W PCR REFLEX
C Diff antigen: NEGATIVE
C Diff interpretation: NOT DETECTED
C Diff toxin: NEGATIVE

## 2024-09-09 LAB — PHOSPHORUS: Phosphorus: 3.4 mg/dL (ref 2.5–4.6)

## 2024-09-09 MED ORDER — LOPERAMIDE HCL 2 MG PO CAPS
2.0000 mg | ORAL_CAPSULE | Freq: Three times a day (TID) | ORAL | Status: DC | PRN
  Administered 2024-09-09: 2 mg via ORAL
  Filled 2024-09-09: qty 1

## 2024-09-09 MED ORDER — HYDROXYZINE HCL 25 MG PO TABS
25.0000 mg | ORAL_TABLET | Freq: Three times a day (TID) | ORAL | Status: DC | PRN
  Administered 2024-09-09 – 2024-09-11 (×2): 25 mg via ORAL
  Filled 2024-09-09 (×2): qty 1

## 2024-09-09 MED ORDER — CHLORHEXIDINE GLUCONATE CLOTH 2 % EX PADS
6.0000 | MEDICATED_PAD | Freq: Every day | CUTANEOUS | Status: DC
  Administered 2024-09-09 – 2024-09-11 (×3): 6 via TOPICAL

## 2024-09-09 MED ORDER — ALPRAZOLAM 0.5 MG PO TABS: 0.5000 mg | ORAL_TABLET | Freq: Three times a day (TID) | ORAL | Status: DC | PRN

## 2024-09-09 MED ORDER — POTASSIUM CHLORIDE CRYS ER 20 MEQ PO TBCR
40.0000 meq | EXTENDED_RELEASE_TABLET | Freq: Once | ORAL | Status: AC
  Administered 2024-09-09: 40 meq via ORAL
  Filled 2024-09-09: qty 2

## 2024-09-09 MED ORDER — MIDODRINE HCL 5 MG PO TABS
5.0000 mg | ORAL_TABLET | Freq: Three times a day (TID) | ORAL | Status: DC
  Administered 2024-09-09 – 2024-09-10 (×4): 5 mg via ORAL
  Filled 2024-09-09 (×4): qty 1

## 2024-09-09 MED ORDER — SODIUM CHLORIDE 0.9% FLUSH
10.0000 mL | Freq: Two times a day (BID) | INTRAVENOUS | Status: DC
  Administered 2024-09-09 – 2024-09-10 (×2): 10 mL
  Administered 2024-09-10: 40 mL
  Administered 2024-09-11: 10 mL

## 2024-09-09 MED ORDER — POTASSIUM CHLORIDE CRYS ER 20 MEQ PO TBCR: 40.0000 meq | EXTENDED_RELEASE_TABLET | Freq: Two times a day (BID) | ORAL | Status: DC

## 2024-09-09 MED ORDER — SODIUM CHLORIDE 0.9% FLUSH: 10.0000 mL | INTRAVENOUS | Status: DC | PRN

## 2024-09-09 NOTE — Consult Note (Signed)
 " [facility]  Telephone:([phone] [phone]  ID: Tina Carrillo OB: 04/22/1971  MR#: [id number]  RDW#:[id number]  [name], NP as PCP - General (Family Medicine) Georgina Shasta POUR, RN as Oncology Nurse Navigator [name], [name], MD as Consulting Physician (Oncology)  CHIEF COMPLAINT: Pathologic stage Ib triple negative invasive carcinoma of right breast, hyponatremia, pancytopenia.  INTERVAL HISTORY: Patient is a 54 year old female who recently initiated chemotherapy with Adriamycin  and Cytoxan  for the above-stated breast cancer on August 30, 2024.  Patient had increased nausea, vomiting, and diarrhea after treatment and was also found to have severe hyponatremia of 118.  Since admission she feels significantly improved, but not back to baseline.  She has no neurologic complaints.  She denies any recent fevers.  She has a fair appetite, but denies weight loss.  She has no chest pain, shortness of breath, cough, or hemoptysis.  She has no urinary complaints.  Patient offers no further specific complaints today.  REVIEW OF SYSTEMS:   Review of Systems  Constitutional:  Positive for malaise/fatigue. Negative for fever and weight loss.  Respiratory: Negative.  Negative for cough, hemoptysis and shortness of breath.   Cardiovascular: Negative.  Negative for chest pain and leg swelling.  Gastrointestinal:  Positive for diarrhea, nausea and vomiting.  Genitourinary: Negative.  Negative for dysuria.  Musculoskeletal: Negative.  Negative for back pain.  Skin: Negative.  Negative for rash.  Neurological:  Positive for weakness. Negative for dizziness, focal weakness and headaches.  Psychiatric/Behavioral:  The patient is nervous/anxious.     As per HPI. Otherwise, a complete review of systems is negative.  PAST MEDICAL HISTORY: Past Medical History:  Diagnosis Date   Adrenal benign tumor    Adrenal hyperplasia    Anxiety    Diabetes  mellitus without complication (HCC)    Ductal carcinoma in situ (DCIS) of right breast    GERD (gastroesophageal reflux disease)    History of repair of congenital cleft palate    Leukocytosis    a.) in setting of chronic corticosteroid use; b.) seen in consult by hematology 2019 --> flow cytometry revealed no immunophenotypic abnormality (no monocolon B cell population or neoplastic T cell process)   PONV (postoperative nausea and vomiting)    Sleep apnea    a.) unable to afford DME required for nocturnal PAP    PAST SURGICAL HISTORY: Past Surgical History:  Procedure Laterality Date   AXILLARY SENTINEL NODE BIOPSY Right 07/02/2024   Procedure: BIOPSY, LYMPH NODE, SENTINEL, AXILLARY;  Surgeon: Tye Millet, DO;  Location: [facility];  Service: General;  Laterality: Right;   BREAST BIOPSY Right 06/08/2024   US  RT BREAST BX W LOC DEV 1ST LESION IMG BX SPEC US  GUIDE 06/08/2024 [facility]   BREAST BIOPSY Right 06/22/2024   US  RT BREAST SAVI/RF TAG 1ST LESION US  GUIDE 06/22/2024 [facility]   BREAST LUMPECTOMY WITH RADIO FREQUENCY LOCALIZER Right 07/02/2024   Procedure: BREAST LUMPECTOMY WITH RADIO FREQUENCY LOCALIZER;  Surgeon: Tye Millet, DO;  Location: [facility];  Service: General;  Laterality: Right;   CHOLECYSTECTOMY     CLEFT PALATE REPAIR     MYRINGOTOMY WITH TUBE PLACEMENT Bilateral 06/25/2023   Procedure: MYRINGOTOMY WITH TUBE PLACEMENT (BUTTERFLY TUBES);  Surgeon: [name], MD;  Location: [facility];  Service: ENT;  Laterality: Bilateral;   PORTACATH PLACEMENT Left 08/17/2024   Procedure: INSERTION, TUNNELED CENTRAL VENOUS DEVICE, WITH PORT;  Surgeon: Tye Millet, DO;  Location: [facility];  Service: General;  Laterality: Left;   removal of right adrenal gland  2024   done at [facility]   VAGINA RECONSTRUCTION SURGERY  1985    FAMILY HISTORY: Family History  Problem Relation Age of Onset   Ovarian cancer Mother    Bipolar disorder Mother    Diabetes  Mother    Uterine cancer Sister    Breast cancer Sister 65       mat half sister   Hypertension Father    Heart disease Father    Diabetes Maternal Grandmother    Brain cancer Paternal Grandmother     ADVANCED DIRECTIVES (Y/N):  @ADVDIR @  HEALTH MAINTENANCE: Social History[1]   Colonoscopy:  PAP:  Bone density:  Lipid panel:  Allergies[2]  Current Facility-Administered Medications  Medication Dose Route Frequency Provider Last Rate Last Admin   acetaminophen  (TYLENOL ) tablet 500-1,000 mg  500-1,000 mg Oral Q6H PRN Sreenath, Sudheer B, MD       albuterol  (PROVENTIL ) (2.5 MG/3ML) 0.083% nebulizer solution 2.5 mg  2.5 mg Nebulization Q2H PRN Sreenath, Sudheer B, MD       ALPRAZolam  (XANAX ) tablet 0.25 mg  0.25 mg Oral QHS Sreenath, Sudheer B, MD   0.25 mg at 09/08/24 2134   ALPRAZolam  (XANAX ) tablet 0.5 mg  0.5 mg Oral TID PRN Von Bellis, MD       cyclobenzaprine  (FLEXERIL ) tablet 5-10 mg  5-10 mg Oral TID PRN Sreenath, Sudheer B, MD       dapagliflozin  propanediol (FARXIGA ) tablet 10 mg  10 mg Oral Daily Sreenath, Sudheer B, MD   10 mg at 09/09/24 9073   filgrastim -aafi (NIVESTYM ) injection 480 mcg  480 mcg Subcutaneous Daily Von Bellis, MD   480 mcg at 09/09/24 1300   fludrocortisone  (FLORINEF ) tablet 0.1 mg  0.1 mg Oral q AM Sreenath, Sudheer B, MD   0.1 mg at 09/09/24 9380   fluticasone  (FLONASE ) 50 MCG/ACT nasal spray 1 spray  1 spray Each Nare Daily PRN Jhonny Sahara B, MD       HYDROcodone -acetaminophen  (NORCO/VICODIN) 5-325 MG per tablet 1 tablet  1 tablet Oral Q8H PRN Sreenath, Sudheer B, MD       hydrOXYzine  (ATARAX ) tablet 25 mg  25 mg Oral TID PRN Von Bellis, MD   25 mg at 09/09/24 1300   ibuprofen  (ADVIL ) tablet 400 mg  400 mg Oral Q8H PRN Jhonny Sahara B, MD       insulin  aspart (novoLOG ) injection 0-15 Units  0-15 Units Subcutaneous TID WC Kumar, Dileep, MD   3 Units at 09/09/24 1259   linagliptin  (TRADJENTA ) tablet 2.5 mg  2.5 mg Oral BID AC  Sreenath, Sudheer B, MD   2.5 mg at 09/09/24 9072   loperamide  (IMODIUM ) capsule 2 mg  2 mg Oral TID PRN Von Bellis, MD   2 mg at 09/09/24 1313   midodrine  (PROAMATINE ) tablet 5 mg  5 mg Oral TID WC Kumar, Dileep, MD   5 mg at 09/09/24 1300   ondansetron  (ZOFRAN ) injection 4 mg  4 mg Intravenous Q6H PRN Jhonny Sahara B, MD   4 mg at 09/06/24 1835   predniSONE  (DELTASONE ) tablet 10 mg  10 mg Oral Q breakfast Jhonny Sahara B, MD   10 mg at 09/09/24 9073   And   predniSONE  (DELTASONE ) tablet 5 mg  5 mg Oral Q supper Sreenath, Sudheer B, MD   5 mg at 09/08/24 1725   promethazine  (PHENERGAN ) 12.5 mg in sodium chloride  0.9 % 50 mL IVPB  12.5 mg Intravenous  Q6H PRN Jhonny Calvin NOVAK, MD       saccharomyces boulardii (FLORASTOR) capsule 250 mg  250 mg Oral BID Von Bellis, MD   250 mg at 09/09/24 9072   Vitamin D  (Ergocalciferol ) (DRISDOL ) 1.25 MG (50000 UNIT) capsule 50,000 Units  50,000 Units Oral Q7 days Von Bellis, MD   50,000 Units at 09/08/24 1730    OBJECTIVE: Vitals:   09/09/24 1157 09/09/24 1538  BP: 106/73 112/66  Pulse: (!) 104 92  Resp: 16 16  Temp: 98.3 F (36.8 C) 98.3 F (36.8 C)  SpO2: 97% 100%     There is no height or weight on file to calculate BMI.    ECOG FS:1 - Symptomatic but completely ambulatory  General: Well-developed, well-nourished, no acute distress. Eyes: Pink conjunctiva, anicteric sclera. HEENT: Normocephalic, moist mucous membranes. Lungs: No audible wheezing or coughing. Heart: Regular rate and rhythm. Abdomen: Soft, nontender, no obvious distention. Musculoskeletal: No edema, cyanosis, or clubbing. Neuro: Alert, answering all questions appropriately. Cranial nerves grossly intact. Skin: No rashes or petechiae noted. Psych: Normal affect. Lymphatics: No cervical, calvicular, axillary or inguinal LAD.   LAB RESULTS:  Lab Results  Component Value Date   NA 133 (L) 09/09/2024   K 3.3 (L) 09/09/2024   CL 99 09/09/2024   CO2 23  09/09/2024   GLUCOSE 96 09/09/2024   BUN 6 09/09/2024   CREATININE 0.47 09/09/2024   CALCIUM 8.7 (L) 09/09/2024   PROT 7.3 09/06/2024   ALBUMIN 4.4 09/06/2024   AST 38 09/06/2024   ALT 61 (H) 09/06/2024   ALKPHOS 81 09/06/2024   BILITOT 0.6 09/06/2024   GFRNONAA >60 09/09/2024   GFRAA >60 09/08/2018    Lab Results  Component Value Date   WBC 1.0 (LL) 09/09/2024   NEUTROABS 0.1 (LL) 09/09/2024   HGB 9.7 (L) 09/09/2024   HCT 28.5 (L) 09/09/2024   MCV 81.0 09/09/2024   PLT 83 (L) 09/09/2024     STUDIES: DG CHEST PORT 1 VIEW Result Date: 08/17/2024 CLINICAL DATA:  Port-A-Cath none EXAM: PORTABLE CHEST 1 VIEW COMPARISON:  01/08/2016 FINDINGS: Left-sided central venous port with tip projecting over the low right atrium/right atrial IVC junction. Marked hypoventilatory change results in exaggeration of the cardiac size, enlarged mediastinal silhouette likely due to low lung volume. Aortic atherosclerosis. Bibasilar atelectasis. No pneumothorax IMPRESSION: 1. Left-sided central venous port with tip projecting over the low right atrium/right atrial IVC junction. No pneumothorax. 2. Marked hypoventilatory change with bibasilar atelectasis. Electronically Signed   By: Luke Bun M.D.   On: 08/17/2024 16:45   DG C-Arm 1-60 Min-No Report Result Date: 08/17/2024 Fluoroscopy was utilized by the requesting physician.  No radiographic interpretation.    ASSESSMENT: Pathologic stage Ib triple negative invasive carcinoma of right breast, hyponatremia, pancytopenia.  PLAN:    Pathologic stage Ib triple negative invasive carcinoma of right breast: Patient received cycle 1 of Adriamycin  and Cytoxan  on August 30, 2024.  Her next scheduled treatment is on September 13, 2024.  She has been instructed to keep this appointment as scheduled and we will determine on that date whether to proceed with chemotherapy or delay 1 week given her side effects, pancytopenia, and hospital admission. Hyponatremia:  Secondary to chemotherapy as well as nausea, vomiting, diarrhea.  Significantly improved from 118-133. Hypokalemia: Mild.  Replace electrolytes as needed. Neutropenia: Secondary to chemotherapy.  Patient did not receive her G-CSF injection as an outpatient and now has received 2 doses of Nivestym .  Continue daily treatment until [facility]  is greater than 1000. Anemia: Patient's hemoglobin trend down to 9.7.  Monitor. Thrombocytopenia: Secondary to chemotherapy.  Patient's platelet count is decreased, but stable at 83. Nausea and vomiting: Improved. Diarrhea: Continue Imodium  as needed. Congenital adrenal hyperplasia: Continue Florinef  and prednisone  as prescribed. Disposition: Okay to discharge from an oncology standpoint.  Follow-up as above in the cancer center.   Appreciate consult, will follow.  [name] JINNY Reusing, MD   09/09/2024 3:56 PM        [1]  Social History Tobacco Use   Smoking status: Former    Current packs/day: 0.00    Types: Cigarettes    Quit date: 2018    Years since quitting: 8.0   Smokeless tobacco: Never  Vaping Use   Vaping status: Never Used  Substance Use Topics   Alcohol use: Yes    Alcohol/week: 1.0 standard drink of alcohol    Types: 1 Cans of beer per week    Comment: rarely   Drug use: No  [2]  Allergies Allergen Reactions   Benzodiazepines Hives   Tramadol  Nausea And Vomiting   Morphine  And Codeine  Nausea And Vomiting   Nucynta [Tapentadol] Nausea And Vomiting   Oxycodone  Itching   "

## 2024-09-09 NOTE — Progress Notes (Signed)
 [facility] Progress Note  Patient: Tina Carrillo    FMW:[id number]  DOA: 09/06/2024     Date of Service: the patient was seen and examined on 09/09/2024  No chief complaint on file.  Brief hospital course: 54 y.o. female with medical history significant of congenital adrenal hyperplasia, breast cancer on active chemotherapy who presents to the hospital with a 3 to 4-day history of intractable nausea and vomiting associated with diarrhea.  Patient presented to the cancer center on 1/12 and was noted to P very weak and tremulous.  Vital signs were significant for elevated pulse rate of 137.  Remainder vital signs within normal limits.   Direct admission to the hospital was requested by the oncology service.  When I evaluated the patient at bedside she is resting in bed.  She appears fatigued.  Sister at bedside.  Patient answers all questions appropriately.  Confirms 3 to 4 days of diarrhea that immediately followed a chemotherapeutic treatment.    Assessment and Plan:  Principal Problem:   Hyponatremia     # Hypotonic hyponatremia # Hypochloremia Appears fairly acute.  Last metabolic panel in 09/03/2024 was significant for mild hyponatremia at 127.  Slowly correcting from 118-121 Serum osmolality 268, low Plan: Inpatient Continue telemetry monitoring S/p IVF NS Na 133, improving Check BMP daily   # Hypokalemia, potassium repleted. Monitor and replete as needed.  # Leukopenia with neutropenia Wbc 0.7 >1.0 ANC 0.1 >0.1 1/14 started Zarxio  480 mcg subcu daily until ANC >1000 Following oncology  # Thrombocytopenia Plts 86k >83 Monitor CBC daily   Tachycardia Appears sinus and relatively asymptomatic Continue to suspect volume depletion in the setting of diarrhea and intractable nausea and vomiting.  2D echocardiogram done in December 2025 overall reassuring.  Grade 1 diastolic dysfunction with no valvulopathy and normal ejection fraction. Plan: S/p IV fluid  resuscitation as above Telemetry monitoring   Intractable nausea and vomiting Diarrhea This started approximately 3 to 4 days ago after patient's last round of chemotherapy.  At this time I have relatively low suspicion for infectious etiology. Plan: As needed Phenergan  As needed Zofran  for refractory nausea and vomiting Regular diet as tolerated 1/14 negative C. difficile and GI pathogen Started probiotic Started Imodium  as needed   Congenital adrenal hyperplasia Chronic adrenal insufficiency No indication of adrenal crisis Vital signs reassuring Plan: Home regimen Florinef  0.1 mg every morning Prednisone  5 to 10 mg per home regimen   Breast cancer On active chemotherapy Outpatient oncology follow-up   # Hypertension # Hypotension 1/15 low blood pressure, started midodrine  5 mg p.o. 3 times daily with holding parameters Patient is not on any antihypertensive medication at home   Anxiety PTA Xanax  and Atarax  as needed   Hyperlipidemia Hold statin   Diabetes mellitus with hyperglycemia Hemoglobin A1c 7.9 1/14 discontinued metformin  for now Continue Tradjenta  2.5 mg p.o. twice daily Continue NovoLog  sliding's    Vitamin D  deficiency: started vitamin D  50,000 units p.o. weekly, follow with PCP to repeat vitamin D  level after 3 to 6 months.     There is no height or weight on file to calculate BMI.  Interventions:  Diet: Carb modified diet DVT Prophylaxis: SCDs due to thrombocytopenia  Advance goals of care discussion: Full code  Family Communication: family was not present at bedside, at the time of interview.  The pt provided permission to discuss medical plan with the family. Opportunity was given to ask question and all questions were answered satisfactorily.   Disposition:  Pt  is from Home, admitted with hyponatremia, still has low sodium, which precludes a safe discharge. Discharge to home, when stable, may need few days to improve.  Subjective: No  significant events overnight.  Patient still having watery diarrhea, 2 episodes today and 2 episodes yesterday.  No nausea vomiting, denied any other complaints. Patient was having some anxiety, stated that she is taking Xanax  at home.  Which has been resumed.   Physical Exam: General: NAD, lying comfortably Appear in no distress, affect appropriate Eyes: PERRLA ENT: Oral Mucosa Clear, moist  Neck: no JVD,  Cardiovascular: S1 and S2 Present, no Murmur,  Respiratory: good respiratory effort, Bilateral Air entry equal and Decreased, no Crackles, no wheezes Abdomen: Bowel Sound present, Soft and no tenderness,  Skin: no rashes Extremities: no Pedal edema, no calf tenderness Neurologic: without any new focal findings Gait not checked due to patient safety concerns  Vitals:   09/08/24 2347 09/09/24 0333 09/09/24 0750 09/09/24 1157  BP: 95/62 110/67 108/75 106/73  Pulse: (!) 109 (!) 111 (!) 108 (!) 104  Resp: 16 16 16 16   Temp: 98.8 F (37.1 C) 98.2 F (36.8 C) 98.1 F (36.7 C) 98.3 F (36.8 C)  TempSrc: Oral     SpO2: 99% 99% 99% 97%    Intake/Output Summary (Last 24 hours) at 09/09/2024 1507 Last data filed at 09/09/2024 1300 Gross per 24 hour  Intake 2993.88 ml  Output --  Net 2993.88 ml   There were no vitals filed for this visit.  Data Reviewed: I have personally reviewed and interpreted daily labs, tele strips, imagings as discussed above. I reviewed all nursing notes, pharmacy notes, vitals, pertinent old records I have discussed plan of care as described above with RN and patient/family.  CBC: Recent Labs  Lab 09/03/24 1435 09/06/24 0840 09/07/24 0431 09/08/24 1237 09/09/24 0431 09/09/24 0432  WBC 18.3* 3.9* 2.1* 0.7* 1.1* 1.0*  NEUTROABS 15.6* 2.3  --  0.1* 0.1*  --   HGB 15.3* 13.7 12.4 11.2* 9.7* 9.7*  HCT 44.9 40.0 35.5* 32.9* 28.8* 28.5*  MCV 80.5 78.7* 78.4* 79.3* 81.1 81.0  PLT 203 116* 93* 86* 81* 83*   Basic Metabolic Panel: Recent Labs  Lab  09/03/24 1435 09/06/24 0840 09/07/24 0431 09/08/24 0414 09/09/24 0432  NA 127* 118* 121* 129* 133*  K 4.3 3.9 4.1 4.2 3.3*  CL 92* 83* 88* 97* 99  CO2 17* 19* 18* 21* 23  GLUCOSE 250* 311* 148* 128* 96  BUN 23* 20 16 8 6   CREATININE 0.62 0.64 0.52 0.56 0.47  CALCIUM 10.2 10.0 8.6* 8.8* 8.7*  MG 2.1  --   --  1.7 1.7  PHOS  --   --   --  3.4 3.4    Studies: No results found.  Scheduled Meds:  ALPRAZolam   0.25 mg Oral QHS   dapagliflozin  propanediol  10 mg Oral Daily   filgrastim  (NIVESTYM ) SQ  480 mcg Subcutaneous Daily   fludrocortisone   0.1 mg Oral q AM   insulin  aspart  0-15 Units Subcutaneous TID WC   linagliptin   2.5 mg Oral BID AC   midodrine   5 mg Oral TID WC   predniSONE   10 mg Oral Q breakfast   And   predniSONE   5 mg Oral Q supper   saccharomyces boulardii  250 mg Oral BID   Vitamin D  (Ergocalciferol )  50,000 Units Oral Q7 days   Continuous Infusions:  promethazine  (PHENERGAN ) injection (IM or IVPB)     PRN  Meds: acetaminophen , albuterol , ALPRAZolam , cyclobenzaprine , fluticasone , HYDROcodone -acetaminophen , hydrOXYzine , ibuprofen , loperamide , ondansetron  (ZOFRAN ) IV, promethazine  (PHENERGAN ) injection (IM or IVPB)  Time spent: 40 minutes  Author: ELVAN SOR. MD [facility] 09/09/2024 3:07 PM  To reach On-call, see care teams to locate the attending and reach out to them via [email or web address]. If 7PM-7AM, please contact night-coverage If you still have difficulty reaching the attending provider, please page the [facility] (Director on Call) for [facility] on amion for assistance.

## 2024-09-09 NOTE — Plan of Care (Signed)

## 2024-09-09 NOTE — Progress Notes (Signed)
 Mobility Specialist - Progress Note    09/09/24 1700  Mobility  Activity Ambulated with assistance;Stood with assistance;Dangled on edge of bed  Level of Assistance Independent after set-up  Assistive Device None  Distance Ambulated (ft) 25 ft  Range of Motion/Exercises Active  Activity Response Tolerated well  Mobility visit 1 Mobility  Mobility Specialist Start Time (ACUTE ONLY) 1459  Mobility Specialist Stop Time (ACUTE ONLY) 1521  Mobility Specialist Time Calculation (min) (ACUTE ONLY) 22 min   Pt was supine in bed with the HOB elevated on RA upon entry. Pt agreed to mobility. Pt is able to independently get to the EOB with bed features. Pt is able today to STS independently with no AD. Pt ambulated well within the room. After activity pt returned to the bed with needs in reach.  Tina Carrillo Mobility Specialist 09/09/24, 5:30 PM

## 2024-09-10 ENCOUNTER — Other Ambulatory Visit: Payer: Self-pay

## 2024-09-10 ENCOUNTER — Ambulatory Visit

## 2024-09-10 DIAGNOSIS — E871 Hypo-osmolality and hyponatremia: Secondary | ICD-10-CM | POA: Diagnosis not present

## 2024-09-10 LAB — GLUCOSE, CAPILLARY
Glucose-Capillary: 136 mg/dL — ABNORMAL HIGH (ref 70–99)
Glucose-Capillary: 194 mg/dL — ABNORMAL HIGH (ref 70–99)
Glucose-Capillary: 133 mg/dL — ABNORMAL HIGH (ref 70–99)

## 2024-09-10 LAB — CBC WITH DIFFERENTIAL/PLATELET
Abs Immature Granulocytes: 0.02 K/uL (ref 0.00–0.07)
Basophils Absolute: 0 K/uL (ref 0.0–0.1)
Basophils Relative: 1 %
Eosinophils Absolute: 0 K/uL (ref 0.0–0.5)
Eosinophils Relative: 0 %
HCT: 29.7 % — ABNORMAL LOW (ref 36.0–46.0)
Hemoglobin: 10.1 g/dL — ABNORMAL LOW (ref 12.0–15.0)
Immature Granulocytes: 1 %
Lymphocytes Relative: 75 %
Lymphs Abs: 1.1 K/uL (ref 0.7–4.0)
MCH: 27.3 pg (ref 26.0–34.0)
MCHC: 34 g/dL (ref 30.0–36.0)
MCV: 80.3 fL (ref 80.0–100.0)
Monocytes Absolute: 0.3 K/uL (ref 0.1–1.0)
Monocytes Relative: 16 %
Neutro Abs: 0.1 K/uL — CL (ref 1.7–7.7)
Neutrophils Relative %: 7 %
Platelets: 121 K/uL — ABNORMAL LOW (ref 150–400)
RBC: 3.7 MIL/uL — ABNORMAL LOW (ref 3.87–5.11)
RDW: 13.3 % (ref 11.5–15.5)
Smear Review: NORMAL
WBC: 1.5 K/uL — ABNORMAL LOW (ref 4.0–10.5)
nRBC: 0 % (ref 0.0–0.2)

## 2024-09-10 LAB — HEPATIC FUNCTION PANEL
ALT: 74 U/L — ABNORMAL HIGH (ref 0–44)
AST: 37 U/L (ref 15–41)
Albumin: 3.6 g/dL (ref 3.5–5.0)
Alkaline Phosphatase: 67 U/L (ref 38–126)
Bilirubin, Direct: 0.1 mg/dL (ref 0.0–0.2)
Total Bilirubin: <0.2 mg/dL (ref 0.0–1.2)
Total Protein: 6.1 g/dL — ABNORMAL LOW (ref 6.5–8.1)

## 2024-09-10 LAB — BASIC METABOLIC PANEL WITH GFR
Anion gap: 11 (ref 5–15)
BUN: 6 mg/dL (ref 6–20)
CO2: 22 mmol/L (ref 22–32)
Calcium: 9.3 mg/dL (ref 8.9–10.3)
Chloride: 99 mmol/L (ref 98–111)
Creatinine, Ser: 0.47 mg/dL (ref 0.44–1.00)
GFR, Estimated: >60 mL/min
Glucose, Bld: 103 mg/dL — ABNORMAL HIGH (ref 70–99)
Potassium: 3.5 mmol/L (ref 3.5–5.1)
Sodium: 132 mmol/L — ABNORMAL LOW (ref 135–145)

## 2024-09-10 LAB — MAGNESIUM: Magnesium: 1.8 mg/dL (ref 1.7–2.4)

## 2024-09-10 LAB — PHOSPHORUS: Phosphorus: 3.5 mg/dL (ref 2.5–4.6)

## 2024-09-10 MED ORDER — MIDODRINE HCL 5 MG PO TABS
10.0000 mg | ORAL_TABLET | Freq: Three times a day (TID) | ORAL | Status: DC
  Administered 2024-09-10 – 2024-09-11 (×4): 10 mg via ORAL
  Filled 2024-09-10 (×4): qty 2

## 2024-09-10 NOTE — Progress Notes (Signed)
Per Dr Dwyane Dee, dc tele monitoring

## 2024-09-10 NOTE — Progress Notes (Signed)
 [facility] Progress Note  Patient: Tina Carrillo    FMW:[id number]  DOA: 09/06/2024     Date of Service: the patient was seen and examined on 09/10/2024  No chief complaint on file.  Brief hospital course: 54 y.o. female with medical history significant of congenital adrenal hyperplasia, breast cancer on active chemotherapy who presents to the hospital with a 3 to 4-day history of intractable nausea and vomiting associated with diarrhea.  Patient presented to the cancer center on 1/12 and was noted to P very weak and tremulous.  Vital signs were significant for elevated pulse rate of 137.  Remainder vital signs within normal limits.   Direct admission to the hospital was requested by the oncology service.  When I evaluated the patient at bedside she is resting in bed.  She appears fatigued.  Sister at bedside.  Patient answers all questions appropriately.  Confirms 3 to 4 days of diarrhea that immediately followed a chemotherapeutic treatment.    Assessment and Plan:  Principal Problem:   Hyponatremia     # Hypotonic hyponatremia # Hypochloremia Appears fairly acute.  Last metabolic panel in 09/03/2024 was significant for mild hyponatremia at 127.  Slowly correcting from 118-121 Serum osmolality 268, low Plan: Inpatient Continue telemetry monitoring S/p IVF NS Na 132, improving Check BMP daily   # Hypokalemia, potassium repleted. Monitor and replete as needed.  # Leukopenia with neutropenia Wbc 0.7 >1.0>1.5 ANC 0.1 >0.1 1/14 started Zarxio  480 mcg subcu daily until ANC >1000 Following oncology  # Thrombocytopenia Plts 86k >83>121 Monitor CBC daily   Tachycardia Appears sinus and relatively asymptomatic Continue to suspect volume depletion in the setting of diarrhea and intractable nausea and vomiting.  2D echocardiogram done in December 2025 overall reassuring.  Grade 1 diastolic dysfunction with no valvulopathy and normal ejection fraction. Plan: S/p IV fluid  resuscitation as above Telemetry monitoring   Intractable nausea and vomiting Diarrhea This started approximately 3 to 4 days ago after patient's last round of chemotherapy.  At this time I have relatively low suspicion for infectious etiology. Plan: As needed Phenergan  As needed Zofran  for refractory nausea and vomiting Regular diet as tolerated 1/14 Negative C. difficile and GI pathogen Started probiotic Started Imodium  as needed   Congenital adrenal hyperplasia Chronic adrenal insufficiency No indication of adrenal crisis Vital signs reassuring Plan: Home regimen Florinef  0.1 mg every morning Prednisone  5 to 10 mg per home regimen   Breast cancer On active chemotherapy Outpatient oncology follow-up   # Hypertension # Hypotension 1/15 low blood pressure, started midodrine  5 mg p.o. 3 times daily with holding parameters 1/16 increase midodrine  from 5 to 10 mg p.o. 3 times daily Patient is not on any antihypertensive medication at home   Anxiety PTA Xanax  and Atarax  as needed   Hyperlipidemia Hold statin   Diabetes mellitus with hyperglycemia Hemoglobin A1c 7.9 1/14 discontinued metformin  for now Continue Tradjenta  2.5 mg p.o. twice daily Continue NovoLog  sliding's    Vitamin D  deficiency: started vitamin D  50,000 units p.o. weekly, follow with PCP to repeat vitamin D  level after 3 to 6 months.     There is no height or weight on file to calculate BMI.  Interventions:  Diet: Carb modified diet DVT Prophylaxis: SCDs due to thrombocytopenia  Advance goals of care discussion: Full code  Family Communication: family was not present at bedside, at the time of interview.  The pt provided permission to discuss medical plan with the family. Opportunity was given to ask  question and all questions were answered satisfactorily.   Disposition:  Pt is from Home, admitted with hyponatremia, still has low sodium, which precludes a safe discharge. Discharge to home,  when stable, may need few days to improve.  Subjective: No significant events overnight.  Patient denied any diarrhea today, she had 2 episodes yesterday.  No nausea vomiting, tolerating diet well.  Denied any abdominal pain, no chest pain or palpitation, no shortness of breath  Physical Exam: General: NAD, lying comfortably Appear in no distress, affect appropriate Eyes: PERRLA ENT: Oral Mucosa Clear, moist  Neck: no JVD,  Cardiovascular: S1 and S2 Present, no Murmur,  Respiratory: good respiratory effort, Bilateral Air entry equal and Decreased, no Crackles, no wheezes Abdomen: Bowel Sound present, Soft and no tenderness,  Skin: no rashes Extremities: no Pedal edema, no calf tenderness Neurologic: without any new focal findings Gait not checked due to patient safety concerns  Vitals:   09/09/24 1934 09/09/24 2316 09/10/24 0345 09/10/24 0743  BP: (!) 103/59 113/71 110/74 107/74  Pulse: (!) 107 93 (!) 103 (!) 108  Resp:      Temp: 98.7 F (37.1 C) 98.8 F (37.1 C) 98.3 F (36.8 C) 99.4 F (37.4 C)  TempSrc: Oral Oral Oral Oral  SpO2: 99% 96% 98% 98%    Intake/Output Summary (Last 24 hours) at 09/10/2024 1434 Last data filed at 09/10/2024 1300 Gross per 24 hour  Intake 600 ml  Output --  Net 600 ml   There were no vitals filed for this visit.  Data Reviewed: I have personally reviewed and interpreted daily labs, tele strips, imagings as discussed above. I reviewed all nursing notes, pharmacy notes, vitals, pertinent old records I have discussed plan of care as described above with RN and patient/family.  CBC: Recent Labs  Lab 09/03/24 1435 09/03/24 1435 09/06/24 0840 09/07/24 0431 09/08/24 1237 09/09/24 0431 09/09/24 0432 09/10/24 0735  WBC 18.3*   < > 3.9* 2.1* 0.7* 1.1* 1.0* 1.5*  NEUTROABS 15.6*  --  2.3  --  0.1* 0.1*  --  0.1*  HGB 15.3*   < > 13.7 12.4 11.2* 9.7* 9.7* 10.1*  HCT 44.9  --  40.0 35.5* 32.9* 28.8* 28.5* 29.7*  MCV 80.5  --  78.7* 78.4*  79.3* 81.1 81.0 80.3  PLT 203   < > 116* 93* 86* 81* 83* 121*   < > = values in this interval not displayed.   Basic Metabolic Panel: Recent Labs  Lab 09/03/24 1435 09/06/24 0840 09/07/24 0431 09/08/24 0414 09/09/24 0432 09/10/24 0735  NA 127* 118* 121* 129* 133* 132*  K 4.3 3.9 4.1 4.2 3.3* 3.5  CL 92* 83* 88* 97* 99 99  CO2 17* 19* 18* 21* 23 22  GLUCOSE 250* 311* 148* 128* 96 103*  BUN 23* 20 16 8 6 6   CREATININE 0.62 0.64 0.52 0.56 0.47 0.47  CALCIUM 10.2 10.0 8.6* 8.8* 8.7* 9.3  MG 2.1  --   --  1.7 1.7 1.8  PHOS  --   --   --  3.4 3.4 3.5    Studies: No results found.  Scheduled Meds:  ALPRAZolam   0.25 mg Oral QHS   Chlorhexidine  Gluconate Cloth  6 each Topical Daily   dapagliflozin  propanediol  10 mg Oral Daily   filgrastim  (NIVESTYM ) SQ  480 mcg Subcutaneous Daily   fludrocortisone   0.1 mg Oral q AM   insulin  aspart  0-15 Units Subcutaneous TID WC   linagliptin   2.5 mg Oral  BID AC   midodrine   10 mg Oral TID WC   predniSONE   10 mg Oral Q breakfast   And   predniSONE   5 mg Oral Q supper   saccharomyces boulardii  250 mg Oral BID   sodium chloride  flush  10-40 mL Intracatheter Q12H   Vitamin D  (Ergocalciferol )  50,000 Units Oral Q7 days   Continuous Infusions:  promethazine  (PHENERGAN ) injection (IM or IVPB)     PRN Meds: acetaminophen , albuterol , ALPRAZolam , cyclobenzaprine , fluticasone , HYDROcodone -acetaminophen , hydrOXYzine , ibuprofen , loperamide , ondansetron  (ZOFRAN ) IV, promethazine  (PHENERGAN ) injection (IM or IVPB), sodium chloride  flush  Time spent: 40 minutes  Author: ELVAN SOR. MD [facility] 09/10/2024 2:34 PM  To reach On-call, see care teams to locate the attending and reach out to them via [email or web address]. If 7PM-7AM, please contact night-coverage If you still have difficulty reaching the attending provider, please page the [facility] (Director on Call) for [facility] on amion for assistance.

## 2024-09-10 NOTE — Plan of Care (Signed)

## 2024-09-11 ENCOUNTER — Other Ambulatory Visit: Payer: Self-pay

## 2024-09-11 ENCOUNTER — Encounter: Payer: Self-pay | Admitting: Internal Medicine

## 2024-09-11 DIAGNOSIS — E871 Hypo-osmolality and hyponatremia: Secondary | ICD-10-CM | POA: Diagnosis not present

## 2024-09-11 LAB — HEPATIC FUNCTION PANEL
ALT: 85 U/L — ABNORMAL HIGH (ref 0–44)
AST: 38 U/L (ref 15–41)
Albumin: 3.5 g/dL (ref 3.5–5.0)
Alkaline Phosphatase: 77 U/L (ref 38–126)
Bilirubin, Direct: 0.1 mg/dL (ref 0.0–0.2)
Indirect Bilirubin: 0.1 mg/dL — ABNORMAL LOW (ref 0.3–0.9)
Total Bilirubin: 0.2 mg/dL (ref 0.0–1.2)
Total Protein: 6.1 g/dL — ABNORMAL LOW (ref 6.5–8.1)

## 2024-09-11 LAB — CBC
HCT: 31 % — ABNORMAL LOW (ref 36.0–46.0)
Hemoglobin: 10.1 g/dL — ABNORMAL LOW (ref 12.0–15.0)
MCH: 26.9 pg (ref 26.0–34.0)
MCHC: 32.6 g/dL (ref 30.0–36.0)
MCV: 82.7 fL (ref 80.0–100.0)
Platelets: 169 K/uL (ref 150–400)
RBC: 3.75 MIL/uL — ABNORMAL LOW (ref 3.87–5.11)
RDW: 13.2 % (ref 11.5–15.5)
WBC: 4.9 K/uL (ref 4.0–10.5)
nRBC: 0.4 % — ABNORMAL HIGH (ref 0.0–0.2)

## 2024-09-11 LAB — CBC WITH DIFFERENTIAL/PLATELET
Abs Immature Granulocytes: 0.53 K/uL — ABNORMAL HIGH (ref 0.00–0.07)
Basophils Absolute: 0.1 K/uL (ref 0.0–0.1)
Basophils Relative: 1 %
Eosinophils Absolute: 0 K/uL (ref 0.0–0.5)
Eosinophils Relative: 0 %
HCT: 30.6 % — ABNORMAL LOW (ref 36.0–46.0)
Hemoglobin: 10.1 g/dL — ABNORMAL LOW (ref 12.0–15.0)
Immature Granulocytes: 11 %
Lymphocytes Relative: 40 %
Lymphs Abs: 2 K/uL (ref 0.7–4.0)
MCH: 27.4 pg (ref 26.0–34.0)
MCHC: 33 g/dL (ref 30.0–36.0)
MCV: 82.9 fL (ref 80.0–100.0)
Monocytes Absolute: 1 K/uL (ref 0.1–1.0)
Monocytes Relative: 19 %
Neutro Abs: 1.4 K/uL — ABNORMAL LOW (ref 1.7–7.7)
Neutrophils Relative %: 29 %
Platelets: 164 K/uL (ref 150–400)
RBC: 3.69 MIL/uL — ABNORMAL LOW (ref 3.87–5.11)
RDW: 13.3 % (ref 11.5–15.5)
Smear Review: NORMAL
WBC: 4.9 K/uL (ref 4.0–10.5)
nRBC: 0.4 % — ABNORMAL HIGH (ref 0.0–0.2)

## 2024-09-11 LAB — BASIC METABOLIC PANEL WITH GFR
Anion gap: 10 (ref 5–15)
BUN: 10 mg/dL (ref 6–20)
CO2: 23 mmol/L (ref 22–32)
Calcium: 9.3 mg/dL (ref 8.9–10.3)
Chloride: 102 mmol/L (ref 98–111)
Creatinine, Ser: 0.51 mg/dL (ref 0.44–1.00)
GFR, Estimated: >60 mL/min
Glucose, Bld: 112 mg/dL — ABNORMAL HIGH (ref 70–99)
Potassium: 4 mmol/L (ref 3.5–5.1)
Sodium: 134 mmol/L — ABNORMAL LOW (ref 135–145)

## 2024-09-11 LAB — MAGNESIUM: Magnesium: 1.9 mg/dL (ref 1.7–2.4)

## 2024-09-11 LAB — PHOSPHORUS: Phosphorus: 3.4 mg/dL (ref 2.5–4.6)

## 2024-09-11 LAB — GLUCOSE, CAPILLARY
Glucose-Capillary: 98 mg/dL (ref 70–99)
Glucose-Capillary: 152 mg/dL — ABNORMAL HIGH (ref 70–99)

## 2024-09-11 MED ORDER — HEPARIN SOD (PORK) LOCK FLUSH 100 UNIT/ML IV SOLN
500.0000 [IU] | Freq: Once | INTRAVENOUS | Status: AC
  Administered 2024-09-11: 500 [IU] via INTRAVENOUS
  Filled 2024-09-11: qty 5

## 2024-09-11 MED ORDER — ORAL CARE MOUTH RINSE: 15.0000 mL | OROMUCOSAL | Status: DC | PRN

## 2024-09-11 MED ORDER — VITAMIN D (ERGOCALCIFEROL) 1.25 MG (50000 UNIT) PO CAPS
50000.0000 [IU] | ORAL_CAPSULE | ORAL | 0 refills | Status: AC
  Filled 2024-09-11: qty 4, 28d supply, fill #0

## 2024-09-11 MED ORDER — MIDODRINE HCL 10 MG PO TABS
10.0000 mg | ORAL_TABLET | Freq: Three times a day (TID) | ORAL | 0 refills | Status: AC | PRN
  Filled 2024-09-11: qty 30, 10d supply, fill #0

## 2024-09-11 NOTE — Plan of Care (Signed)

## 2024-09-11 NOTE — Progress Notes (Signed)
 SPIRITUAL CARE AND COUNSELING CONSULT NOTE   VISIT SUMMARY Chaplain offered a compassionate presence to patient who was dealing with complicated grief due to health diagnosis and unresolved issues related to her narrative history.  Chaplain prayed with patient and Chaplain and Nurse offered patient resources for counsel and/or therapy.  Patient's wife came to the bedside shortly thereafter.    SPIRITUAL ENCOUNTER                                                                                                                                                                      Type of Visit: Initial Care provided to:: Pt and family Conversation partners present during encounter: Nurse Referral source: Nurse (RN/NT/LPN) Reason for visit: Grief/loss OnCall Visit: Yes   SPIRITUAL FRAMEWORK      GOALS       INTERVENTIONS        INTERVENTION OUTCOMES      SPIRITUAL CARE PLAN        If immediate needs arise, please contact [facility] 24 hour on call [phone].   Hart Moats, Chaplain  09/11/2024 1:36 PM

## 2024-09-11 NOTE — Discharge Summary (Signed)
 [facility] Discharge Summary   Patient: Tina Carrillo FMW:[id number]  PCP: Harvey Gaetana CROME, NP  Date of admission: 09/06/2024   Date of discharge:  09/11/2024     Discharge Diagnoses:  Principal diagnosis  Principal Problem:   Hyponatremia   Admitted From: Home Disposition:  Home   Recommendations for Outpatient Follow-up:  Follow-up with PCP in 1 week. Follow-up with oncology in 1 week, repeat CBC with differential after 1 week Repeat BMP after 1 week Repeat vitamin D  level in between 3 to 6 months Monitor BP intake Midodrine  10 mg p.o. 3 times daily as needed if systolic BP less than 100 mmHg Follow up LABS/TEST: As above   Follow-up Information     Fields, Glenda L, NP Follow up.   Specialty: Family Medicine Why: hospital follow up Contact information: [address] [place] [address] [phone]         [name] [name], MD Follow up in 1 week(s).   Specialty: Oncology Contact information: [address] [place] [address] [phone]                Diet recommendation: Cardiac and Carb modified diet  Activity: The patient is advised to gradually reintroduce usual activities, as tolerated  Discharge Condition: stable  Code Status: Full code   History of present illness: As per the H and P dictated on admission.    Hospital Course:  54 y.o. female with medical history significant of congenital adrenal hyperplasia, breast cancer on active chemotherapy who presents to the hospital with a 3 to 4-day history of intractable nausea and vomiting associated with diarrhea.  Patient presented to the cancer center on 1/12 and was noted to P very weak and tremulous.  Vital signs were significant for elevated pulse rate of 137.  Remainder vital signs within normal limits.   Direct admission to the hospital was requested by the oncology service.  When I evaluated the patient at bedside she is resting in bed.  She appears fatigued.   Sister at bedside.  Patient answers all questions appropriately.  Confirms 3 to 4 days of diarrhea that immediately followed a chemotherapeutic treatment.   Assessment and Plan:  # Hypotonic hyponatremia # Hypochloremia Appears fairly acute.  Last metabolic panel in 09/03/2024 was significant for mild hyponatremia at 127.  Slowly correcting from 118-121 Serum osmolality 268, low S/p IVF NS, Na 134, improved.  Patient was advised to follow with PCP to repeat BMP after 1 week. # Hypokalemia, potassium repleted. # Leukopenia with neutropenia Wbc 0.7 >1.0>1.5> 4.9 ANC 0.1 >0.1>> 1.4 1/14 started Zarxio  480 mcg subcu daily until ANC >1000 Following oncology.  Patient was cleared by oncology to discharge and follow-up as an outpatient.  Repeat CBC with differential in 1 week   # Thrombocytopenia: Resolved Plts 86k >83>121> 169.  Repeat CBC after 1 week   # Tachycardia: Appears sinus and relatively asymptomatic Continue to suspect volume depletion in the setting of diarrhea and intractable nausea and vomiting.  2D echocardiogram done in December 2025 overall reassuring.  Grade 1 diastolic dysfunction with no valvulopathy and normal ejection fraction. S/p IV fluid resuscitation as above.  Monitored on telemetry.  Heart rate with fluctuating and patient was anxious that could be contributing factor as well.    # Intractable nausea and vomiting: Resolved # Diarrhea: Resolved This started approximately 3 to 4 days ago after patient's last round of chemotherapy.  At this time I have relatively low suspicion for infectious  etiology. S/p prn Phenergan  and Zofran  prn for refractory nausea and vomiting. Regular diet as tolerated.  1/14 Negative C. difficile and GI pathogen. S/p probiotic and Imodium  prn. Nausea, vomiting and diarrhea resolved.  Patient is tolerating diet well.   # Congenital adrenal hyperplasia # Chronic adrenal insufficiency No indication of adrenal crisis. Vital signs  reassuring Resumed home meds Florinef  0.1 mg every morning and Prednisone  5 to 10 mg per home regimen   # Breast cancer: On active chemotherapy Outpatient oncology follow-up   # Hypertension # Hypotension 1/15 low blood pressure, started midodrine  5 mg p.o. 3 times daily with holding parameters 1/16 increase midodrine  from 5 to 10 mg p.o. 3 times daily Patient was discharged on midodrine  10 mg p.o. 3 times daily prn if systolic BP <100 mmHg.  Discontinued amlodipine for now.  Patient was advised to monitor BP at home and follow with PCP to titrate medication accordingly.   # Anxiety: PTA Xanax  and Atarax  as needed # Hyperlipidemia: Resumed simvastatin 40 mg p.o. nightly home dose on discharge.   # Diabetes mellitus with hyperglycemia Hemoglobin A1c 7.9, On 1/14 discontinued metformin . Continue Tradjenta  2.5 mg p.o. twice daily. S/p NovoLog  sliding's Resumed Farxiga , and Janumet on discharge.  Monitor CBG at home, continue diabetic diet and follow-up with PCP.  # Vitamin D  deficiency: started vitamin D  50,000 units p.o. weekly, follow with PCP to repeat vitamin D  level after 3 to 6 months.  There is no height or weight on file to calculate BMI.  Nutrition Interventions:   - Patient was instructed, not to drive, operate heavy machinery, perform activities at heights, swimming or participation in water  activities or provide baby sitting services while on Pain, Sleep and Anxiety Medications; until her outpatient Physician has advised to do so again.  - Also recommended to not to take more than prescribed Pain, Sleep and Anxiety Medications.  Patient was ambulatory without any assistance.  On the day of the discharge the patient's vitals were stable, and no other acute medical condition were reported by patient. the patient was felt safe to be discharge at Home.  Consultants: Oncologist Procedures: None  Discharge Exam: General: Appear in no distress, Oral Mucosa Clear,  moist. Cardiovascular: S1 and S2 Present, no Murmur, Respiratory: normal respiratory effort, Bilateral Air entry present and no Crackles, no wheezes Abdomen: Bowel Sound present, Soft and no tenderness. Extremities: no Pedal edema, no calf tenderness Neurology: alert and oriented to time, place, and person affect appropriate.  There were no vitals filed for this visit. Vitals:   09/11/24 0330 09/11/24 0723  BP: 101/68 110/74  Pulse: 89 92  Resp:  18  Temp: 98.1 F (36.7 C) (!) 97.5 F (36.4 C)  SpO2: 100% 99%    DISCHARGE MEDICATION: Allergies as of 09/11/2024       Reactions   Benzodiazepines Hives   Tramadol  Nausea And Vomiting   Morphine  And Codeine  Nausea And Vomiting   Nucynta [tapentadol] Nausea And Vomiting   Oxycodone  Itching        Medication List     STOP taking these medications    amLODipine 2.5 MG tablet Commonly known as: NORVASC   ibuprofen  200 MG tablet Commonly known as: ADVIL    ondansetron  4 MG tablet Commonly known as: Zofran    ondansetron  8 MG tablet Commonly known as: Zofran    prochlorperazine  10 MG tablet Commonly known as: COMPAZINE        TAKE these medications    acetaminophen  500 MG tablet Commonly known  as: TYLENOL  Take 500-1,000 mg by mouth every 6 (six) hours as needed (pain.).   ALPRAZolam  0.5 MG tablet Commonly known as: XANAX  Take 0.25 mg by mouth at bedtime.   cyclobenzaprine  10 MG tablet Commonly known as: FLEXERIL  Take 0.5-1 tablets (5-10 mg total) by mouth 3 (three) times daily as needed.   Farxiga  10 MG Tabs tablet Generic drug: dapagliflozin  propanediol Take 10 mg by mouth daily.   Flintstones Complete Chew Chew 1 tablet by mouth in the morning.   fludrocortisone  0.1 MG tablet Commonly known as: FLORINEF  Take 0.1 mg by mouth in the morning.   fluticasone  50 MCG/ACT nasal spray Commonly known as: FLONASE  Place 1 spray into both nostrils daily as needed for allergies or rhinitis.    HYDROcodone -acetaminophen  5-325 MG tablet Commonly known as: NORCO/VICODIN Take 1 tablet by mouth every 8 (eight) hours as needed for moderate pain (pain score 4-6).   lidocaine -prilocaine  cream Commonly known as: EMLA  Apply 1 Application topically once.   midodrine  10 MG tablet Commonly known as: PROAMATINE  Take 1 tablet (10 mg total) by mouth 3 (three) times daily as needed (Systolic BP less than 100 mmHg).   predniSONE  10 MG tablet Commonly known as: DELTASONE  Take 5-10 mg by mouth See admin instructions. 5 at night 10 am   promethazine  12.5 MG tablet Commonly known as: PHENERGAN  Take 12.5 mg by mouth every 8 (eight) hours as needed for vomiting or nausea.   simvastatin 40 MG tablet Commonly known as: ZOCOR Take 40 mg by mouth at bedtime.   SitaGLIPtin-MetFORMIN  HCl 50-1000 MG Tb24 Take 1 tablet by mouth in the morning and at bedtime.   Solu-CORTEF  100 MG injection Generic drug: hydrocortisone  sodium succinate Inject 2 mLs into the muscle as needed (Adrenal Crisis).   Vitamin D  (Ergocalciferol ) 1.25 MG (50000 UNIT) Caps capsule Commonly known as: DRISDOL  Take 1 capsule (50,000 Units total) by mouth every 7 (seven) days. Start taking on: September 15, 2024       Allergies[1] Discharge Instructions     Call MD for:  difficulty breathing, headache or visual disturbances   Complete by: As directed    Call MD for:  extreme fatigue   Complete by: As directed    Call MD for:  persistant dizziness or light-headedness   Complete by: As directed    Call MD for:  persistant nausea and vomiting   Complete by: As directed    Call MD for:  severe uncontrolled pain   Complete by: As directed    Call MD for:  temperature >100.4   Complete by: As directed    Discharge instructions   Complete by: As directed    Follow-up with PCP in 1 week. Follow-up with oncology in 1 week, repeat CBC with differential after 1 week Repeat BMP after 1 week Repeat vitamin D  level in between  3 to 6 months Monitor BP intake Midodrine  10 mg p.o. 3 times daily as needed if systolic BP less than 100 mmHg   Increase activity slowly   Complete by: As directed        The results of significant diagnostics from this hospitalization (including imaging, microbiology, ancillary and laboratory) are listed below for reference.    Significant Diagnostic Studies: DG CHEST PORT 1 VIEW Result Date: 08/17/2024 CLINICAL DATA:  Port-A-Cath none EXAM: PORTABLE CHEST 1 VIEW COMPARISON:  01/08/2016 FINDINGS: Left-sided central venous port with tip projecting over the low right atrium/right atrial IVC junction. Marked hypoventilatory change results in exaggeration of the  cardiac size, enlarged mediastinal silhouette likely due to low lung volume. Aortic atherosclerosis. Bibasilar atelectasis. No pneumothorax IMPRESSION: 1. Left-sided central venous port with tip projecting over the low right atrium/right atrial IVC junction. No pneumothorax. 2. Marked hypoventilatory change with bibasilar atelectasis. Electronically Signed   By: Luke Bun M.D.   On: 08/17/2024 16:45   DG C-Arm 1-60 Min-No Report Result Date: 08/17/2024 Fluoroscopy was utilized by the requesting physician.  No radiographic interpretation.    Microbiology: Recent Results (from the past 240 hours)  Gastrointestinal Panel by PCR , Stool     Status: None   Collection Time: 09/09/24 10:58 AM   Specimen: Stool  Result Value Ref Range Status   Campylobacter species NOT DETECTED NOT DETECTED Final   Plesimonas shigelloides NOT DETECTED NOT DETECTED Final   Salmonella species NOT DETECTED NOT DETECTED Final   Yersinia enterocolitica NOT DETECTED NOT DETECTED Final   Vibrio species NOT DETECTED NOT DETECTED Final   Vibrio cholerae NOT DETECTED NOT DETECTED Final   Enteroaggregative E coli (EAEC) NOT DETECTED NOT DETECTED Final   Enteropathogenic E coli (EPEC) NOT DETECTED NOT DETECTED Final   Enterotoxigenic E coli (ETEC) NOT  DETECTED NOT DETECTED Final   Shiga like toxin producing E coli (STEC) NOT DETECTED NOT DETECTED Final   Shigella/Enteroinvasive E coli (EIEC) NOT DETECTED NOT DETECTED Final   Cryptosporidium NOT DETECTED NOT DETECTED Final   Cyclospora cayetanensis NOT DETECTED NOT DETECTED Final   Entamoeba histolytica NOT DETECTED NOT DETECTED Final   Giardia lamblia NOT DETECTED NOT DETECTED Final   Adenovirus F40/41 NOT DETECTED NOT DETECTED Final   Astrovirus NOT DETECTED NOT DETECTED Final   Norovirus GI/GII NOT DETECTED NOT DETECTED Final   Rotavirus A NOT DETECTED NOT DETECTED Final   Sapovirus (I, II, IV, and V) NOT DETECTED NOT DETECTED Final    Comment: Performed at [facility], [address] [place], [place] [address]  C Difficile Quick Screen w PCR reflex     Status: None   Collection Time: 09/09/24 10:58 AM   Specimen: STOOL  Result Value Ref Range Status   C Diff antigen NEGATIVE NEGATIVE Final   C Diff toxin NEGATIVE NEGATIVE Final   C Diff interpretation No C. difficile detected.  Final    Comment: Performed at [facility], [address] [place], [place] [address]     Labs: CBC: Recent Labs  Lab 09/06/24 0840 09/07/24 0431 09/08/24 1237 09/09/24 0431 09/09/24 0432 09/10/24 0735 09/11/24 0639  WBC 3.9*   < > 0.7* 1.1* 1.0* 1.5* 4.9  4.9  NEUTROABS 2.3  --  0.1* 0.1*  --  0.1* 1.4*  HGB 13.7   < > 11.2* 9.7* 9.7* 10.1* 10.1*  10.1*  HCT 40.0   < > 32.9* 28.8* 28.5* 29.7* 30.6*  31.0*  MCV 78.7*   < > 79.3* 81.1 81.0 80.3 82.9  82.7  PLT 116*   < > 86* 81* 83* 121* 164  169   < > = values in this interval not displayed.   Basic Metabolic Panel: Recent Labs  Lab 09/07/24 0431 09/08/24 0414 09/09/24 0432 09/10/24 0735 09/11/24 0639  NA 121* 129* 133* 132* 134*  K 4.1 4.2 3.3* 3.5 4.0  CL 88* 97* 99 99 102  CO2 18* 21* 23 22 23   GLUCOSE 148* 128* 96 103* 112*  BUN 16 8 6 6 10   CREATININE 0.52 0.56 0.47 0.47 0.51  CALCIUM 8.6* 8.8*  8.7* 9.3 9.3  MG  --  1.7 1.7 1.8 1.9  PHOS  --  3.4 3.4 3.5 3.4   Liver Function Tests: Recent Labs  Lab 09/06/24 0840 09/10/24 0735 09/11/24 0639  AST 38 37 38  ALT 61* 74* 85*  ALKPHOS 81 67 77  BILITOT 0.6 <0.2 0.2  PROT 7.3 6.1* 6.1*  ALBUMIN 4.4 3.6 3.5   No results for input(s): LIPASE, AMYLASE in the last 168 hours. No results for input(s): AMMONIA in the last 168 hours. Cardiac Enzymes: No results for input(s): CKTOTAL, CKMB, CKMBINDEX, TROPONINI in the last 168 hours. BNP (last 3 results) No results for input(s): BNP in the last 8760 hours. CBG: Recent Labs  Lab 09/10/24 1141 09/10/24 1522 09/10/24 1918 09/11/24 0725 09/11/24 1143  GLUCAP 136* 194* 133* 98 152*    Time spent: 35 minutes  Signed:  Elvan Sor  [facility] 09/11/2024 1:48 PM      [1]  Allergies Allergen Reactions   Benzodiazepines Hives   Tramadol  Nausea And Vomiting   Morphine  And Codeine  Nausea And Vomiting   Nucynta [Tapentadol] Nausea And Vomiting   Oxycodone  Itching

## 2024-09-11 NOTE — Plan of Care (Signed)
 " Problem: Education: Goal: Knowledge of General Education information will improve Description: Including pain rating scale, medication(s)/side effects and non-pharmacologic comfort measures 09/11/2024 0704 by Nettie Norris, LPN Outcome: Progressing 09/11/2024 0704 by Nettie Norris, LPN Outcome: Progressing 09/11/2024 0703 by Nettie Norris, LPN Outcome: Progressing   Problem: Health Behavior/Discharge Planning: Goal: Ability to manage health-related needs will improve 09/11/2024 0704 by Nettie Norris, LPN Outcome: Progressing 09/11/2024 0704 by Nettie Norris, LPN Outcome: Progressing 09/11/2024 0703 by Nettie Norris, LPN Outcome: Progressing   Problem: Clinical Measurements: Goal: Ability to maintain clinical measurements within normal limits will improve 09/11/2024 0704 by Nettie Norris, LPN Outcome: Progressing 09/11/2024 0704 by Nettie Norris, LPN Outcome: Progressing 09/11/2024 0703 by Nettie Norris, LPN Outcome: Progressing Goal: Will remain free from infection 09/11/2024 0704 by Nettie Norris, LPN Outcome: Progressing 09/11/2024 0704 by Nettie Norris, LPN Outcome: Progressing 09/11/2024 0703 by Nettie Norris, LPN Outcome: Progressing Goal: Diagnostic test results will improve 09/11/2024 0704 by Nettie Norris, LPN Outcome: Progressing 09/11/2024 0704 by Nettie Norris, LPN Outcome: Progressing 09/11/2024 0703 by Nettie Norris, LPN Outcome: Progressing Goal: Respiratory complications will improve 09/11/2024 0704 by Nettie Norris, LPN Outcome: Progressing 09/11/2024 0704 by Nettie Norris, LPN Outcome: Progressing 09/11/2024 0703 by Nettie Norris, LPN Outcome: Progressing Goal: Cardiovascular complication will be avoided 09/11/2024 0704 by Nettie Norris, LPN Outcome: Progressing 09/11/2024 0704 by Nettie Norris, LPN Outcome: Progressing 09/11/2024 0703 by Nettie Norris,  LPN Outcome: Progressing   Problem: Activity: Goal: Risk for activity intolerance will decrease 09/11/2024 0704 by Nettie Norris, LPN Outcome: Progressing 09/11/2024 0704 by Nettie Norris, LPN Outcome: Progressing 09/11/2024 0703 by Nettie Norris, LPN Outcome: Progressing   Problem: Nutrition: Goal: Adequate nutrition will be maintained 09/11/2024 0704 by Nettie Norris, LPN Outcome: Progressing 09/11/2024 0704 by Nettie Norris, LPN Outcome: Progressing 09/11/2024 0703 by Nettie Norris, LPN Outcome: Progressing   Problem: Coping: Goal: Level of anxiety will decrease 09/11/2024 0704 by Nettie Norris, LPN Outcome: Progressing 09/11/2024 0704 by Nettie Norris, LPN Outcome: Progressing 09/11/2024 0703 by Nettie Norris, LPN Outcome: Progressing   Problem: Elimination: Goal: Will not experience complications related to bowel motility 09/11/2024 0704 by Nettie Norris, LPN Outcome: Progressing 09/11/2024 0704 by Nettie Norris, LPN Outcome: Progressing 09/11/2024 0703 by Nettie Norris, LPN Outcome: Progressing Goal: Will not experience complications related to urinary retention 09/11/2024 0704 by Nettie Norris, LPN Outcome: Progressing 09/11/2024 0704 by Nettie Norris, LPN Outcome: Progressing 09/11/2024 0703 by Nettie Norris, LPN Outcome: Progressing   Problem: Pain Managment: Goal: General experience of comfort will improve and/or be controlled 09/11/2024 0704 by Nettie Norris, LPN Outcome: Progressing 09/11/2024 0704 by Nettie Norris, LPN Outcome: Progressing 09/11/2024 0703 by Nettie Norris, LPN Outcome: Progressing   Problem: Safety: Goal: Ability to remain free from injury will improve 09/11/2024 0704 by Nettie Norris, LPN Outcome: Progressing 09/11/2024 0704 by Nettie Norris, LPN Outcome: Progressing 09/11/2024 0703 by Nettie Norris, LPN Outcome: Progressing   Problem: Skin  Integrity: Goal: Risk for impaired skin integrity will decrease 09/11/2024 0704 by Nettie Norris, LPN Outcome: Progressing 09/11/2024 0704 by Nettie Norris, LPN Outcome: Progressing 09/11/2024 0703 by Nettie Norris, LPN Outcome: Progressing   Problem: Education: Goal: Ability to describe self-care measures that may prevent or decrease complications (Diabetes Survival Skills Education) will improve 09/11/2024 0704 by Nettie Norris, LPN Outcome: Progressing 09/11/2024 0704 by Nettie Norris, LPN Outcome: Progressing 09/11/2024 0703 by Nettie Norris, LPN Outcome: Progressing Goal: Individualized Educational Video(s) 09/11/2024 0704 by Nettie Norris, LPN Outcome: Progressing 09/11/2024 0704 by Nettie Norris, LPN Outcome: Progressing 09/11/2024 0703 by  Rankin, Almarie, LPN Outcome: Progressing   Problem: Coping: Goal: Ability to adjust to condition or change in health will improve 09/11/2024 0704 by Nettie Almarie, LPN Outcome: Progressing 09/11/2024 0704 by Nettie Almarie, LPN Outcome: Progressing 09/11/2024 0703 by Nettie Almarie, LPN Outcome: Progressing   Problem: Fluid Volume: Goal: Ability to maintain a balanced intake and output will improve 09/11/2024 0704 by Nettie Almarie, LPN Outcome: Progressing 09/11/2024 0704 by Nettie Almarie, LPN Outcome: Progressing 09/11/2024 0703 by Nettie Almarie, LPN Outcome: Progressing   Problem: Health Behavior/Discharge Planning: Goal: Ability to identify and utilize available resources and services will improve 09/11/2024 0704 by Nettie Almarie, LPN Outcome: Progressing 09/11/2024 0704 by Nettie Almarie, LPN Outcome: Progressing 09/11/2024 0703 by Nettie Almarie, LPN Outcome: Progressing Goal: Ability to manage health-related needs will improve 09/11/2024 0704 by Nettie Almarie, LPN Outcome: Progressing 09/11/2024 0704 by Nettie Almarie, LPN Outcome:  Progressing 09/11/2024 0703 by Nettie Almarie, LPN Outcome: Progressing   Problem: Metabolic: Goal: Ability to maintain appropriate glucose levels will improve 09/11/2024 0704 by Nettie Almarie, LPN Outcome: Progressing 09/11/2024 0704 by Nettie Almarie, LPN Outcome: Progressing 09/11/2024 0703 by Nettie Almarie, LPN Outcome: Progressing   Problem: Nutritional: Goal: Maintenance of adequate nutrition will improve 09/11/2024 0704 by Nettie Almarie, LPN Outcome: Progressing 09/11/2024 0704 by Nettie Almarie, LPN Outcome: Progressing 09/11/2024 0703 by Nettie Almarie, LPN Outcome: Progressing Goal: Progress toward achieving an optimal weight will improve 09/11/2024 0704 by Nettie Almarie, LPN Outcome: Progressing 09/11/2024 0704 by Nettie Almarie, LPN Outcome: Progressing 09/11/2024 0703 by Nettie Almarie, LPN Outcome: Progressing   Problem: Skin Integrity: Goal: Risk for impaired skin integrity will decrease 09/11/2024 0704 by Nettie Almarie, LPN Outcome: Progressing 09/11/2024 0704 by Nettie Almarie, LPN Outcome: Progressing 09/11/2024 0703 by Nettie Almarie, LPN Outcome: Progressing   Problem: Tissue Perfusion: Goal: Adequacy of tissue perfusion will improve 09/11/2024 0704 by Nettie Almarie, LPN Outcome: Progressing 09/11/2024 0704 by Nettie Almarie, LPN Outcome: Progressing 09/11/2024 0703 by Nettie Almarie, LPN Outcome: Progressing   "

## 2024-09-11 NOTE — Plan of Care (Signed)
 " Problem: Education: Goal: Knowledge of General Education information will improve Description: Including pain rating scale, medication(s)/side effects and non-pharmacologic comfort measures 09/11/2024 0704 by Nettie Norris, LPN Outcome: Progressing 09/11/2024 0703 by Nettie Norris, LPN Outcome: Progressing   Problem: Health Behavior/Discharge Planning: Goal: Ability to manage health-related needs will improve 09/11/2024 0704 by Nettie Norris, LPN Outcome: Progressing 09/11/2024 0703 by Nettie Norris, LPN Outcome: Progressing   Problem: Clinical Measurements: Goal: Ability to maintain clinical measurements within normal limits will improve 09/11/2024 0704 by Nettie Norris, LPN Outcome: Progressing 09/11/2024 0703 by Nettie Norris, LPN Outcome: Progressing Goal: Will remain free from infection 09/11/2024 0704 by Nettie Norris, LPN Outcome: Progressing 09/11/2024 0703 by Nettie Norris, LPN Outcome: Progressing Goal: Diagnostic test results will improve 09/11/2024 0704 by Nettie Norris, LPN Outcome: Progressing 09/11/2024 0703 by Nettie Norris, LPN Outcome: Progressing Goal: Respiratory complications will improve 09/11/2024 0704 by Nettie Norris, LPN Outcome: Progressing 09/11/2024 0703 by Nettie Norris, LPN Outcome: Progressing Goal: Cardiovascular complication will be avoided 09/11/2024 0704 by Nettie Norris, LPN Outcome: Progressing 09/11/2024 0703 by Nettie Norris, LPN Outcome: Progressing   Problem: Activity: Goal: Risk for activity intolerance will decrease 09/11/2024 0704 by Nettie Norris, LPN Outcome: Progressing 09/11/2024 0703 by Nettie Norris, LPN Outcome: Progressing   Problem: Nutrition: Goal: Adequate nutrition will be maintained 09/11/2024 0704 by Nettie Norris, LPN Outcome: Progressing 09/11/2024 0703 by Nettie Norris, LPN Outcome: Progressing   Problem: Coping: Goal: Level  of anxiety will decrease 09/11/2024 0704 by Nettie Norris, LPN Outcome: Progressing 09/11/2024 0703 by Nettie Norris, LPN Outcome: Progressing   Problem: Elimination: Goal: Will not experience complications related to bowel motility 09/11/2024 0704 by Nettie Norris, LPN Outcome: Progressing 09/11/2024 0703 by Nettie Norris, LPN Outcome: Progressing Goal: Will not experience complications related to urinary retention 09/11/2024 0704 by Nettie Norris, LPN Outcome: Progressing 09/11/2024 0703 by Nettie Norris, LPN Outcome: Progressing   Problem: Pain Managment: Goal: General experience of comfort will improve and/or be controlled 09/11/2024 0704 by Nettie Norris, LPN Outcome: Progressing 09/11/2024 0703 by Nettie Norris, LPN Outcome: Progressing   Problem: Safety: Goal: Ability to remain free from injury will improve 09/11/2024 0704 by Nettie Norris, LPN Outcome: Progressing 09/11/2024 0703 by Nettie Norris, LPN Outcome: Progressing   Problem: Skin Integrity: Goal: Risk for impaired skin integrity will decrease 09/11/2024 0704 by Nettie Norris, LPN Outcome: Progressing 09/11/2024 0703 by Nettie Norris, LPN Outcome: Progressing   Problem: Education: Goal: Ability to describe self-care measures that may prevent or decrease complications (Diabetes Survival Skills Education) will improve 09/11/2024 0704 by Nettie Norris, LPN Outcome: Progressing 09/11/2024 0703 by Nettie Norris, LPN Outcome: Progressing Goal: Individualized Educational Video(s) 09/11/2024 0704 by Nettie Norris, LPN Outcome: Progressing 09/11/2024 0703 by Nettie Norris, LPN Outcome: Progressing   Problem: Coping: Goal: Ability to adjust to condition or change in health will improve 09/11/2024 0704 by Nettie Norris, LPN Outcome: Progressing 09/11/2024 0703 by Nettie Norris, LPN Outcome: Progressing   Problem: Fluid Volume: Goal:  Ability to maintain a balanced intake and output will improve 09/11/2024 0704 by Nettie Norris, LPN Outcome: Progressing 09/11/2024 0703 by Nettie Norris, LPN Outcome: Progressing   Problem: Health Behavior/Discharge Planning: Goal: Ability to identify and utilize available resources and services will improve 09/11/2024 0704 by Nettie Norris, LPN Outcome: Progressing 09/11/2024 0703 by Nettie Norris, LPN Outcome: Progressing Goal: Ability to manage health-related needs will improve 09/11/2024 0704 by Nettie Norris, LPN Outcome: Progressing 09/11/2024 0703 by Nettie Norris, LPN Outcome: Progressing   Problem: Metabolic: Goal: Ability to maintain appropriate  glucose levels will improve 09/11/2024 0704 by Nettie Norris, LPN Outcome: Progressing 09/11/2024 0703 by Nettie Norris, LPN Outcome: Progressing   Problem: Nutritional: Goal: Maintenance of adequate nutrition will improve 09/11/2024 0704 by Nettie Norris, LPN Outcome: Progressing 09/11/2024 0703 by Nettie Norris, LPN Outcome: Progressing Goal: Progress toward achieving an optimal weight will improve 09/11/2024 0704 by Nettie Norris, LPN Outcome: Progressing 09/11/2024 0703 by Nettie Norris, LPN Outcome: Progressing   Problem: Skin Integrity: Goal: Risk for impaired skin integrity will decrease 09/11/2024 0704 by Nettie Norris, LPN Outcome: Progressing 09/11/2024 0703 by Nettie Norris, LPN Outcome: Progressing   Problem: Tissue Perfusion: Goal: Adequacy of tissue perfusion will improve 09/11/2024 0704 by Nettie Norris, LPN Outcome: Progressing 09/11/2024 0703 by Nettie Norris, LPN Outcome: Progressing   "

## 2024-09-11 NOTE — Plan of Care (Signed)
 " Problem: Education: Goal: Knowledge of General Education information will improve Description: Including pain rating scale, medication(s)/side effects and non-pharmacologic comfort measures 09/11/2024 0704 by Nettie Norris, LPN Outcome: Progressing 09/11/2024 0704 by Nettie Norris, LPN Outcome: Progressing 09/11/2024 0704 by Nettie Norris, LPN Outcome: Progressing 09/11/2024 0703 by Nettie Norris, LPN Outcome: Progressing   Problem: Health Behavior/Discharge Planning: Goal: Ability to manage health-related needs will improve 09/11/2024 0704 by Nettie Norris, LPN Outcome: Progressing 09/11/2024 0704 by Nettie Norris, LPN Outcome: Progressing 09/11/2024 0704 by Nettie Norris, LPN Outcome: Progressing 09/11/2024 0703 by Nettie Norris, LPN Outcome: Progressing   Problem: Clinical Measurements: Goal: Ability to maintain clinical measurements within normal limits will improve 09/11/2024 0704 by Nettie Norris, LPN Outcome: Progressing 09/11/2024 0704 by Nettie Norris, LPN Outcome: Progressing 09/11/2024 0704 by Nettie Norris, LPN Outcome: Progressing 09/11/2024 0703 by Nettie Norris, LPN Outcome: Progressing Goal: Will remain free from infection 09/11/2024 0704 by Nettie Norris, LPN Outcome: Progressing 09/11/2024 0704 by Nettie Norris, LPN Outcome: Progressing 09/11/2024 0704 by Nettie Norris, LPN Outcome: Progressing 09/11/2024 0703 by Nettie Norris, LPN Outcome: Progressing Goal: Diagnostic test results will improve 09/11/2024 0704 by Nettie Norris, LPN Outcome: Progressing 09/11/2024 0704 by Nettie Norris, LPN Outcome: Progressing 09/11/2024 0704 by Nettie Norris, LPN Outcome: Progressing 09/11/2024 0703 by Nettie Norris, LPN Outcome: Progressing Goal: Respiratory complications will improve 09/11/2024 0704 by Nettie Norris, LPN Outcome: Progressing 09/11/2024 0704 by Nettie Norris, LPN Outcome: Progressing 09/11/2024 0704 by Nettie Norris, LPN Outcome: Progressing 09/11/2024 0703 by Nettie Norris, LPN Outcome: Progressing Goal: Cardiovascular complication will be avoided 09/11/2024 0704 by Nettie Norris, LPN Outcome: Progressing 09/11/2024 0704 by Nettie Norris, LPN Outcome: Progressing 09/11/2024 0704 by Nettie Norris, LPN Outcome: Progressing 09/11/2024 0703 by Nettie Norris, LPN Outcome: Progressing   Problem: Activity: Goal: Risk for activity intolerance will decrease 09/11/2024 0704 by Nettie Norris, LPN Outcome: Progressing 09/11/2024 0704 by Nettie Norris, LPN Outcome: Progressing 09/11/2024 0704 by Nettie Norris, LPN Outcome: Progressing 09/11/2024 0703 by Nettie Norris, LPN Outcome: Progressing   Problem: Nutrition: Goal: Adequate nutrition will be maintained 09/11/2024 0704 by Nettie Norris, LPN Outcome: Progressing 09/11/2024 0704 by Nettie Norris, LPN Outcome: Progressing 09/11/2024 0704 by Nettie Norris, LPN Outcome: Progressing 09/11/2024 0703 by Nettie Norris, LPN Outcome: Progressing   Problem: Coping: Goal: Level of anxiety will decrease 09/11/2024 0704 by Nettie Norris, LPN Outcome: Progressing 09/11/2024 0704 by Nettie Norris, LPN Outcome: Progressing 09/11/2024 0704 by Nettie Norris, LPN Outcome: Progressing 09/11/2024 0703 by Nettie Norris, LPN Outcome: Progressing   Problem: Elimination: Goal: Will not experience complications related to bowel motility 09/11/2024 0704 by Nettie Norris, LPN Outcome: Progressing 09/11/2024 0704 by Nettie Norris, LPN Outcome: Progressing 09/11/2024 0704 by Nettie Norris, LPN Outcome: Progressing 09/11/2024 0703 by Nettie Norris, LPN Outcome: Progressing Goal: Will not experience complications related to urinary retention 09/11/2024 0704 by Nettie Norris, LPN Outcome:  Progressing 09/11/2024 0704 by Nettie Norris, LPN Outcome: Progressing 09/11/2024 0704 by Nettie Norris, LPN Outcome: Progressing 09/11/2024 0703 by Nettie Norris, LPN Outcome: Progressing   Problem: Pain Managment: Goal: General experience of comfort will improve and/or be controlled 09/11/2024 0704 by Nettie Norris, LPN Outcome: Progressing 09/11/2024 0704 by Nettie Norris, LPN Outcome: Progressing 09/11/2024 0704 by Nettie Norris, LPN Outcome: Progressing 09/11/2024 0703 by Nettie Norris, LPN Outcome: Progressing   Problem: Safety: Goal: Ability to remain free from injury will improve 09/11/2024 0704 by Nettie Norris, LPN Outcome: Progressing 09/11/2024 0704 by Nettie Norris, LPN Outcome: Progressing 09/11/2024 0704 by Nettie Norris, LPN Outcome: Progressing 09/11/2024 0703  by Nettie Norris, LPN Outcome: Progressing   Problem: Skin Integrity: Goal: Risk for impaired skin integrity will decrease 09/11/2024 0704 by Nettie Norris, LPN Outcome: Progressing 09/11/2024 0704 by Nettie Norris, LPN Outcome: Progressing 09/11/2024 0704 by Nettie Norris, LPN Outcome: Progressing 09/11/2024 0703 by Nettie Norris, LPN Outcome: Progressing   Problem: Education: Goal: Ability to describe self-care measures that may prevent or decrease complications (Diabetes Survival Skills Education) will improve 09/11/2024 0704 by Nettie Norris, LPN Outcome: Progressing 09/11/2024 0704 by Nettie Norris, LPN Outcome: Progressing 09/11/2024 0704 by Nettie Norris, LPN Outcome: Progressing 09/11/2024 0703 by Nettie Norris, LPN Outcome: Progressing Goal: Individualized Educational Video(s) 09/11/2024 0704 by Nettie Norris, LPN Outcome: Progressing 09/11/2024 0704 by Nettie Norris, LPN Outcome: Progressing 09/11/2024 0704 by Nettie Norris, LPN Outcome: Progressing 09/11/2024 0703 by Nettie Norris,  LPN Outcome: Progressing   Problem: Coping: Goal: Ability to adjust to condition or change in health will improve 09/11/2024 0704 by Nettie Norris, LPN Outcome: Progressing 09/11/2024 0704 by Nettie Norris, LPN Outcome: Progressing 09/11/2024 0704 by Nettie Norris, LPN Outcome: Progressing 09/11/2024 0703 by Nettie Norris, LPN Outcome: Progressing   Problem: Fluid Volume: Goal: Ability to maintain a balanced intake and output will improve 09/11/2024 0704 by Nettie Norris, LPN Outcome: Progressing 09/11/2024 0704 by Nettie Norris, LPN Outcome: Progressing 09/11/2024 0704 by Nettie Norris, LPN Outcome: Progressing 09/11/2024 0703 by Nettie Norris, LPN Outcome: Progressing   Problem: Health Behavior/Discharge Planning: Goal: Ability to identify and utilize available resources and services will improve 09/11/2024 0704 by Nettie Norris, LPN Outcome: Progressing 09/11/2024 0704 by Nettie Norris, LPN Outcome: Progressing 09/11/2024 0704 by Nettie Norris, LPN Outcome: Progressing 09/11/2024 0703 by Nettie Norris, LPN Outcome: Progressing Goal: Ability to manage health-related needs will improve 09/11/2024 0704 by Nettie Norris, LPN Outcome: Progressing 09/11/2024 0704 by Nettie Norris, LPN Outcome: Progressing 09/11/2024 0704 by Nettie Norris, LPN Outcome: Progressing 09/11/2024 0703 by Nettie Norris, LPN Outcome: Progressing   Problem: Metabolic: Goal: Ability to maintain appropriate glucose levels will improve 09/11/2024 0704 by Nettie Norris, LPN Outcome: Progressing 09/11/2024 0704 by Nettie Norris, LPN Outcome: Progressing 09/11/2024 0704 by Nettie Norris, LPN Outcome: Progressing 09/11/2024 0703 by Nettie Norris, LPN Outcome: Progressing   Problem: Nutritional: Goal: Maintenance of adequate nutrition will improve 09/11/2024 0704 by Nettie Norris, LPN Outcome:  Progressing 09/11/2024 0704 by Nettie Norris, LPN Outcome: Progressing 09/11/2024 0704 by Nettie Norris, LPN Outcome: Progressing 09/11/2024 0703 by Nettie Norris, LPN Outcome: Progressing Goal: Progress toward achieving an optimal weight will improve 09/11/2024 0704 by Nettie Norris, LPN Outcome: Progressing 09/11/2024 0704 by Nettie Norris, LPN Outcome: Progressing 09/11/2024 0704 by Nettie Norris, LPN Outcome: Progressing 09/11/2024 0703 by Nettie Norris, LPN Outcome: Progressing   Problem: Skin Integrity: Goal: Risk for impaired skin integrity will decrease 09/11/2024 0704 by Nettie Norris, LPN Outcome: Progressing 09/11/2024 0704 by Nettie Norris, LPN Outcome: Progressing 09/11/2024 0704 by Nettie Norris, LPN Outcome: Progressing 09/11/2024 0703 by Nettie Norris, LPN Outcome: Progressing   Problem: Tissue Perfusion: Goal: Adequacy of tissue perfusion will improve 09/11/2024 0704 by Nettie Norris, LPN Outcome: Progressing 09/11/2024 0704 by Nettie Norris, LPN Outcome: Progressing 09/11/2024 0704 by Nettie Norris, LPN Outcome: Progressing 09/11/2024 0703 by Nettie Norris, LPN Outcome: Progressing   "

## 2024-09-13 ENCOUNTER — Encounter: Payer: Self-pay | Admitting: Oncology

## 2024-09-13 ENCOUNTER — Inpatient Hospital Stay

## 2024-09-13 ENCOUNTER — Telehealth: Payer: Self-pay | Admitting: *Deleted

## 2024-09-13 ENCOUNTER — Inpatient Hospital Stay: Admitting: Oncology

## 2024-09-13 ENCOUNTER — Encounter: Payer: Self-pay | Admitting: *Deleted

## 2024-09-13 VITALS — BP 100/73 | HR 114 | Temp 96.0°F | Resp 20 | Ht 59.0 in | Wt 176.0 lb

## 2024-09-13 VITALS — BP 119/81 | HR 96 | Resp 18

## 2024-09-13 DIAGNOSIS — E876 Hypokalemia: Secondary | ICD-10-CM

## 2024-09-13 DIAGNOSIS — C50911 Malignant neoplasm of unspecified site of right female breast: Secondary | ICD-10-CM

## 2024-09-13 DIAGNOSIS — Z5111 Encounter for antineoplastic chemotherapy: Secondary | ICD-10-CM | POA: Diagnosis not present

## 2024-09-13 DIAGNOSIS — D0511 Intraductal carcinoma in situ of right breast: Secondary | ICD-10-CM

## 2024-09-13 DIAGNOSIS — E871 Hypo-osmolality and hyponatremia: Secondary | ICD-10-CM

## 2024-09-13 LAB — CMP (CANCER CENTER ONLY)
ALT: 51 U/L — ABNORMAL HIGH (ref 0–44)
AST: 29 U/L (ref 15–41)
Albumin: 3.8 g/dL (ref 3.5–5.0)
Alkaline Phosphatase: 150 U/L — ABNORMAL HIGH (ref 38–126)
Anion gap: 14 (ref 5–15)
BUN: 14 mg/dL (ref 6–20)
CO2: 22 mmol/L (ref 22–32)
Calcium: 9.1 mg/dL (ref 8.9–10.3)
Chloride: 99 mmol/L (ref 98–111)
Creatinine: 0.67 mg/dL (ref 0.44–1.00)
GFR, Estimated: >60 mL/min
Glucose, Bld: 161 mg/dL — ABNORMAL HIGH (ref 70–99)
Potassium: 3.2 mmol/L — ABNORMAL LOW (ref 3.5–5.1)
Sodium: 135 mmol/L (ref 135–145)
Total Bilirubin: <0.2 mg/dL (ref 0.0–1.2)
Total Protein: 6.3 g/dL — ABNORMAL LOW (ref 6.5–8.1)

## 2024-09-13 LAB — CBC WITH DIFFERENTIAL (CANCER CENTER ONLY)
Abs Immature Granulocytes: 8.51 K/uL — ABNORMAL HIGH (ref 0.00–0.07)
Basophils Absolute: 0 K/uL (ref 0.0–0.1)
Basophils Relative: 0 %
Eosinophils Absolute: 0 K/uL (ref 0.0–0.5)
Eosinophils Relative: 0 %
HCT: 33 % — ABNORMAL LOW (ref 36.0–46.0)
Hemoglobin: 10.8 g/dL — ABNORMAL LOW (ref 12.0–15.0)
Immature Granulocytes: 30 %
Lymphocytes Relative: 12 %
Lymphs Abs: 3.4 K/uL (ref 0.7–4.0)
MCH: 27.7 pg (ref 26.0–34.0)
MCHC: 32.7 g/dL (ref 30.0–36.0)
MCV: 84.6 fL (ref 80.0–100.0)
Monocytes Absolute: 3 K/uL — ABNORMAL HIGH (ref 0.1–1.0)
Monocytes Relative: 10 %
Neutro Abs: 13.9 K/uL — ABNORMAL HIGH (ref 1.7–7.7)
Neutrophils Relative %: 48 %
Platelet Count: 310 K/uL (ref 150–400)
RBC: 3.9 MIL/uL (ref 3.87–5.11)
RDW: 13.6 % (ref 11.5–15.5)
WBC Count: 28.8 K/uL — ABNORMAL HIGH (ref 4.0–10.5)
nRBC: 0.7 % — ABNORMAL HIGH (ref 0.0–0.2)

## 2024-09-13 MED ORDER — SODIUM CHLORIDE 0.9 % IV SOLN
Freq: Once | INTRAVENOUS | Status: AC
  Filled 2024-09-13: qty 250

## 2024-09-13 MED ORDER — ACETAMINOPHEN 325 MG PO TABS
650.0000 mg | ORAL_TABLET | Freq: Once | ORAL | Status: AC
  Administered 2024-09-13: 650 mg via ORAL
  Filled 2024-09-13: qty 2

## 2024-09-13 MED ORDER — POTASSIUM CHLORIDE 20 MEQ/100ML IV SOLN
20.0000 meq | Freq: Once | INTRAVENOUS | Status: AC
  Administered 2024-09-13: 20 meq via INTRAVENOUS

## 2024-09-13 NOTE — Progress Notes (Signed)
 Patient is feeling very fatigued. She some questions for the doctor today.

## 2024-09-13 NOTE — Telephone Encounter (Signed)
 Caller verified using pt's full name and dob prior to discussing PHI .  returned pt's phone call. She stated that she already got her questions answered from [place]. She was inquiring if she should be on vitamin D  post hospital d/c.

## 2024-09-13 NOTE — Progress Notes (Signed)
 Tina Carrillo called to clarify if she should take the vitamin D  that was prescribed to her by the hospitalists.   She also wanted to make sure it would not interfere with her chemotherapy and that she can take on any day.  Per Dr. [name], ok to take vitamin D  and she will need to follow up with her PCP after she finishes the rx to have her levels checked.

## 2024-09-13 NOTE — Patient Instructions (Signed)
 Hypokalemia Hypokalemia means that the amount of potassium in the blood is lower than normal. Potassium is a mineral (electrolyte) that helps regulate the amount of fluid in the body. It also stimulates muscle tightening (contraction) and helps nerves work properly. Normally, most of the body's potassium is inside cells, and only a very small amount is in the blood. Because the amount in the blood is so small, minor changes to potassium levels in the blood can be life-threatening. What are the causes? This condition may be caused by: Antibiotic medicine. Diarrhea or vomiting. Taking too much of a medicine that helps you have a bowel movement (laxative) can cause diarrhea and lead to hypokalemia. Chronic kidney disease (CKD). Medicines that help the body get rid of excess fluid (diuretics). Eating disorders, such as anorexia or bulimia. Low magnesium levels in the body. Sweating a lot. What are the signs or symptoms? Symptoms of this condition include: Weakness. Constipation. Fatigue. Muscle cramps. Mental confusion. Skipped heartbeats or irregular heartbeat (palpitations). Tingling or numbness. How is this diagnosed? This condition is diagnosed with a blood test. How is this treated? This condition may be treated by: Taking potassium supplements. Adjusting the medicines that you take. Eating more foods that contain a lot of potassium. If your potassium level is very low, you may need to get potassium through an IV and be monitored in the hospital. Follow these instructions at home: Eating and drinking  Eat a healthy diet. A healthy diet includes fresh fruits and vegetables, whole grains, healthy fats, and lean proteins. If told, eat more foods that contain a lot of potassium. These include: Nuts, such as peanuts and pistachios. Seeds, such as sunflower seeds and pumpkin seeds. Peas, lentils, and lima beans. Whole grain and bran cereals and breads. Fresh fruits and vegetables,  such as apricots, avocado, bananas, cantaloupe, kiwi, oranges, tomatoes, asparagus, and potatoes. Juices, such as orange, tomato, and prune. Lean meats, including fish. Milk and milk products, such as yogurt. General instructions Take over-the-counter and prescription medicines only as told by your health care provider. This includes vitamins, natural food products, and supplements. Keep all follow-up visits. This is important. Contact a health care provider if: You have weakness that gets worse. You feel your heart pounding or racing. You vomit. You have diarrhea. You have diabetes and you have trouble keeping your blood sugar in your target range. Get help right away if: You have chest pain. You have shortness of breath. You have vomiting or diarrhea that lasts for more than 2 days. You faint. These symptoms may be an emergency. Get help right away. Call 911. Do not wait to see if the symptoms will go away. Do not drive yourself to the hospital. Summary Hypokalemia means that the amount of potassium in the blood is lower than normal. This condition is diagnosed with a blood test. Hypokalemia may be treated by taking potassium supplements, adjusting the medicines that you take, or eating more foods that are high in potassium. If your potassium level is very low, you may need to get potassium through an IV and be monitored in the hospital. This information is not intended to replace advice given to you by your health care provider. Make sure you discuss any questions you have with your health care provider. Document Revised: 04/26/2021 Document Reviewed: 04/26/2021 Elsevier Patient Education  2024 [employer].

## 2024-09-13 NOTE — Progress Notes (Signed)
 DISCONTINUE ON PATHWAY REGIMEN - Breast     Cycles 1 through 4: A cycle is every 14 days:     Doxorubicin       Cyclophosphamide       Pegfilgrastim-xxxx    Cycles 5 through 16: A cycle is every 7 days:     Paclitaxel   **Always confirm dose/schedule in your pharmacy ordering system**  PRIOR TREATMENT: [id number]: Dose-Dense AC-T (Paclitaxel Weekly) - [Doxorubicin  + Cyclophosphamide  q14 Days x 4 Cycles, Followed by Paclitaxel 80 mg/m2 Weekly x 12 Weeks]  START ON PATHWAY REGIMEN - Breast     A cycle is every 21 days:     Cyclophosphamide       Docetaxel   **Always confirm dose/schedule in your pharmacy ordering system**  Patient Characteristics: Postoperative without Neoadjuvant Therapy, M0 (Pathologic Staging), Invasive Disease, Adjuvant Therapy, HER2 Negative, ER Negative, Node Negative, pT1a-b, N0, Chemotherapy Indicated Therapeutic Status: Postoperative without Neoadjuvant Therapy, M0 (Pathologic Staging) AJCC T Category: pT1b AJCC N Category: pN0 AJCC M Category: cM0 AJCC Grade: G3 ER Status: Negative (-) PR Status: Negative (-) HER2 Status: Negative (-) Oncotype Dx Recurrence Score: Not Appropriate AJCC 8 Stage Grouping: IB Intervention Indicated: Chemotherapy Intent of Therapy: Curative Intent, Discussed with Patient

## 2024-09-13 NOTE — Progress Notes (Signed)
 Pharmacist Chemotherapy Monitoring - Initial Assessment    Anticipated start date: 09/20/24   The following has been reviewed per standard work regarding the patient's treatment regimen: The patient's diagnosis, treatment plan and drug doses, and organ/hematologic function Lab orders and baseline tests specific to treatment regimen  The treatment plan start date, drug sequencing, and pre-medications Prior authorization status  Patient's documented medication list, including drug-drug interaction screen and prescriptions for anti-emetics and supportive care specific to the treatment regimen The drug concentrations, fluid compatibility, administration routes, and timing of the medications to be used The patient's access for treatment and lifetime cumulative dose history, if applicable  The patient's medication allergies and previous infusion related reactions, if applicable  stage Ib triple negative invasive carcinoma of right breast.  Patient only received 1 cycle of Adriamycin  and Cytoxan  which was then discontinued secondary to toxicity. Patient agreed to continue chemotherapy with Taxotere and Cytoxan  only for additional 5 cycles. No regimen calls for G-CSF support, patient refuses.  EF  60--65% Follow up needed:  Pending authorization for treatment    Tina Carrillo, [facility], 09/13/2024  4:02 PM

## 2024-09-13 NOTE — Progress Notes (Signed)
 " [facility]  Telephone:([phone] [phone]  ID: Tina Carrillo OB: Jun 28, 1971  MR#: [id number]  RDW#:[id number]  [name], NP as PCP - General (Family Medicine) Georgina Shasta POUR, RN as Oncology Nurse Navigator [name], [name], MD as Consulting Physician (Oncology)  CHIEF COMPLAINT: Pathologic stage Ib triple negative invasive carcinoma of right breast.  INTERVAL HISTORY: Patient returns to clinic today for further evaluation, hospital follow-up, and consideration of cycle 2 of Adriamycin  and Cytoxan .  She had a difficult time with cycle 1 with a hospital admission, intractable nausea and vomiting and diarrhea.  She is anxious, but otherwise feels well and nearly back to her baseline.  She is no neurologic complaints.  She denies any fevers.  She has no chest pain, shortness of breath, cough, or hemoptysis.  She denies any further nausea, vomiting, constipation, or diarrhea.  She has no urinary complaints.  Patient offers no further specific complaints today.  REVIEW OF SYSTEMS:   Review of Systems  Constitutional:  Positive for malaise/fatigue. Negative for fever and weight loss.  Respiratory: Negative.  Negative for cough, hemoptysis and shortness of breath.   Cardiovascular: Negative.  Negative for chest pain and leg swelling.  Gastrointestinal: Negative.  Negative for abdominal pain, diarrhea, nausea and vomiting.  Genitourinary: Negative.  Negative for dysuria.  Musculoskeletal: Negative.  Negative for back pain.  Skin: Negative.  Negative for rash.  Neurological: Negative.  Negative for dizziness, focal weakness, weakness and headaches.  Psychiatric/Behavioral:  The patient is nervous/anxious.     As per HPI. Otherwise, a complete review of systems is negative.  PAST MEDICAL HISTORY: Past Medical History:  Diagnosis Date   Adrenal benign tumor    Adrenal hyperplasia    Anxiety    Diabetes mellitus without  complication (HCC)    Ductal carcinoma in situ (DCIS) of right breast    GERD (gastroesophageal reflux disease)    History of repair of congenital cleft palate    Leukocytosis    a.) in setting of chronic corticosteroid use; b.) seen in consult by hematology 2019 --> flow cytometry revealed no immunophenotypic abnormality (no monocolon B cell population or neoplastic T cell process)   PONV (postoperative nausea and vomiting)    Sleep apnea    a.) unable to afford DME required for nocturnal PAP    PAST SURGICAL HISTORY: Past Surgical History:  Procedure Laterality Date   AXILLARY SENTINEL NODE BIOPSY Right 07/02/2024   Procedure: BIOPSY, LYMPH NODE, SENTINEL, AXILLARY;  Surgeon: Tye Millet, DO;  Location: [facility];  Service: General;  Laterality: Right;   BREAST BIOPSY Right 06/08/2024   US  RT BREAST BX W LOC DEV 1ST LESION IMG BX SPEC US  GUIDE 06/08/2024 [facility]   BREAST BIOPSY Right 06/22/2024   US  RT BREAST SAVI/RF TAG 1ST LESION US  GUIDE 06/22/2024 [facility]   BREAST LUMPECTOMY WITH RADIO FREQUENCY LOCALIZER Right 07/02/2024   Procedure: BREAST LUMPECTOMY WITH RADIO FREQUENCY LOCALIZER;  Surgeon: Tye Millet, DO;  Location: [facility];  Service: General;  Laterality: Right;   CHOLECYSTECTOMY     CLEFT PALATE REPAIR     MYRINGOTOMY WITH TUBE PLACEMENT Bilateral 06/25/2023   Procedure: MYRINGOTOMY WITH TUBE PLACEMENT (BUTTERFLY TUBES);  Surgeon: [name], MD;  Location: [facility];  Service: ENT;  Laterality: Bilateral;   PORTACATH PLACEMENT Left 08/17/2024   Procedure: INSERTION, TUNNELED CENTRAL VENOUS DEVICE, WITH PORT;  Surgeon: Tye Millet, DO;  Location: [facility];  Service: General;  Laterality: Left;  removal of right adrenal gland  2024   done at [facility]   VAGINA RECONSTRUCTION SURGERY  1985    FAMILY HISTORY: Family History  Problem Relation Age of Onset   Ovarian cancer Mother    Bipolar disorder Mother    Diabetes Mother    Uterine  cancer Sister    Breast cancer Sister 68       mat half sister   Hypertension Father    Heart disease Father    Diabetes Maternal Grandmother    Brain cancer Paternal Grandmother     ADVANCED DIRECTIVES (Y/N):  N  HEALTH MAINTENANCE: Social History   Tobacco Use   Smoking status: Former    Current packs/day: 0.00    Types: Cigarettes    Quit date: 2018    Years since quitting: 8.0   Smokeless tobacco: Never  Vaping Use   Vaping status: Never Used  Substance Use Topics   Alcohol use: Yes    Alcohol/week: 1.0 standard drink of alcohol    Types: 1 Cans of beer per week    Comment: rarely   Drug use: No     Colonoscopy:  PAP:  Bone density:  Lipid panel:  Allergies  Allergen Reactions   Benzodiazepines Hives   Tramadol  Nausea And Vomiting   Morphine  And Codeine  Nausea And Vomiting   Nucynta [Tapentadol] Nausea And Vomiting   Oxycodone  Itching    Current Outpatient Medications  Medication Sig Dispense Refill   acetaminophen  (TYLENOL ) 500 MG tablet Take 500-1,000 mg by mouth every 6 (six) hours as needed (pain.).     ALPRAZolam  (XANAX ) 0.5 MG tablet Take 0.25 mg by mouth at bedtime.     cyclobenzaprine  (FLEXERIL ) 10 MG tablet Take 0.5-1 tablets (5-10 mg total) by mouth 3 (three) times daily as needed. 30 tablet 0   FARXIGA  10 MG TABS tablet Take 10 mg by mouth daily.     fludrocortisone  (FLORINEF ) 0.1 MG tablet Take 0.1 mg by mouth in the morning.     fluticasone  (FLONASE ) 50 MCG/ACT nasal spray Place 1 spray into both nostrils daily as needed for allergies or rhinitis.     HYDROcodone -acetaminophen  (NORCO/VICODIN) 5-325 MG tablet Take 1 tablet by mouth every 8 (eight) hours as needed for moderate pain (pain score 4-6). 15 tablet 0   lidocaine -prilocaine  (EMLA ) cream Apply 1 Application topically once.     midodrine  (PROAMATINE ) 10 MG tablet Take 1 tablet (10 mg total) by mouth 3 (three) times daily as needed (Systolic BP less than 100 mmHg). 30 tablet 0   Pediatric  Multivit-Minerals (FLINTSTONES COMPLETE) CHEW Chew 1 tablet by mouth in the morning.     predniSONE  (DELTASONE ) 10 MG tablet Take 5-10 mg by mouth See admin instructions. 5 at night 10 am     promethazine  (PHENERGAN ) 12.5 MG tablet Take 12.5 mg by mouth every 8 (eight) hours as needed for vomiting or nausea.     simvastatin (ZOCOR) 40 MG tablet Take 40 mg by mouth at bedtime.     SitaGLIPtin-MetFORMIN  HCl 50-1000 MG TB24 Take 1 tablet by mouth in the morning and at bedtime.     SOLU-CORTEF  100 MG injection Inject 2 mLs into the muscle as needed (Adrenal Crisis).     [START ON 09/15/2024] Vitamin D , Ergocalciferol , (DRISDOL ) 1.25 MG (50000 UNIT) CAPS capsule Take 1 capsule (50,000 Units total) by mouth every 7 (seven) days. 12 capsule 0   No current facility-administered medications for this visit.   Facility-Administered Medications Ordered in Other  Visits  Medication Dose Route Frequency Provider Last Rate Last Admin   potassium chloride  20 mEq in 100 mL IVPB  20 mEq Intravenous Once Boris Engelmann J, MD 50 mL/hr at 09/13/24 0934 20 mEq at 09/13/24 0934    OBJECTIVE: Vitals:   09/13/24 0849  BP: 100/73  Pulse: (!) 114  Resp: 20  Temp: (!) 96 F (35.6 C)  SpO2: 100%      Body mass index is 35.55 kg/m.    ECOG FS:1 - Symptomatic but completely ambulatory  General: Well-developed, well-nourished, no acute distress. Eyes: Pink conjunctiva, anicteric sclera. HEENT: Normocephalic, moist mucous membranes. Lungs: No audible wheezing or coughing. Heart: Regular rate and rhythm. Abdomen: Soft, nontender, no obvious distention. Musculoskeletal: No edema, cyanosis, or clubbing. Neuro: Alert, answering all questions appropriately. Cranial nerves grossly intact. Skin: No rashes or petechiae noted. Psych: Normal affect.  LAB RESULTS:  Lab Results  Component Value Date   NA 135 09/13/2024   K 3.2 (L) 09/13/2024   CL 99 09/13/2024   CO2 22 09/13/2024   GLUCOSE 161 (H) 09/13/2024    BUN 14 09/13/2024   CREATININE 0.67 09/13/2024   CALCIUM 9.1 09/13/2024   PROT 6.3 (L) 09/13/2024   ALBUMIN 3.8 09/13/2024   AST 29 09/13/2024   ALT 51 (H) 09/13/2024   ALKPHOS 150 (H) 09/13/2024   BILITOT <0.2 09/13/2024   GFRNONAA >60 09/13/2024   GFRAA >60 09/08/2018    Lab Results  Component Value Date   WBC 28.8 (H) 09/13/2024   NEUTROABS 13.9 (H) 09/13/2024   HGB 10.8 (L) 09/13/2024   HCT 33.0 (L) 09/13/2024   MCV 84.6 09/13/2024   PLT 310 09/13/2024     STUDIES: DG CHEST PORT 1 VIEW Result Date: 08/17/2024 CLINICAL DATA:  Port-A-Cath none EXAM: PORTABLE CHEST 1 VIEW COMPARISON:  01/08/2016 FINDINGS: Left-sided central venous port with tip projecting over the low right atrium/right atrial IVC junction. Marked hypoventilatory change results in exaggeration of the cardiac size, enlarged mediastinal silhouette likely due to low lung volume. Aortic atherosclerosis. Bibasilar atelectasis. No pneumothorax IMPRESSION: 1. Left-sided central venous port with tip projecting over the low right atrium/right atrial IVC junction. No pneumothorax. 2. Marked hypoventilatory change with bibasilar atelectasis. Electronically Signed   By: Luke Bun M.D.   On: 08/17/2024 16:45   DG C-Arm 1-60 Min-No Report Result Date: 08/17/2024 Fluoroscopy was utilized by the requesting physician.  No radiographic interpretation.     ASSESSMENT: Pathologic stage Ib triple negative invasive carcinoma of right breast.  PLAN:    Pathologic stage Ib triple negative invasive carcinoma of right breast: Patient underwent lumpectomy on July 02, 2024 upgrading her malignancy to invasive triple negative carcinoma.  She does not require Oncotype testing.  Patient only received 1 cycle of Adriamycin  and Cytoxan  which was then discontinued secondary to toxicity.  Patient agreed to continue chemotherapy with Taxotere and Cytoxan  only for additional 5 cycles.  No regimen calls for G-CSF support, patient refuses.   Cardiac echo from August 06, 2024 revealed an EF of 60 to 65%.  Patient has had port placement.  PET scan is scheduled for tomorrow.  After completion of patient's chemotherapy, will refer to radiation oncology for adjuvant XRT.  An aromatase inhibitor would not offer benefit.  Return to clinic in 1 week for further evaluation and consideration of cycle 1 of 5 of Taxotere and Cytoxan . Anxiety: Continue 0.5 mg IV Ativan  with each treatment. Congenital adrenal hyperplasia/diabetes: Continue follow-up and treatment per endocrinology. Genetics: Patient  was previously given a referral to genetic counseling. Hyponatremia: Resolved.   Hypokalemia: Patient will receive 20 mEq IV potassium today.   Hyperglycemia: Blood sugar is improved to 161.  Continue monitoring and treatment per endocrinology.   Hypercalcemia: Resolved. Leukocytosis: Likely secondary to G-CSF support.  Patient has declined any further injections. Anemia: Mild.  Patient's hemoglobin is 10.8 today. Thrombocytopenia: Resolved.. Nausea/vomiting/diarrhea: Resolved.  Changed treatments as above.  Patient expressed understanding and was in agreement with this plan. She also understands that She can call clinic at any time with any questions, concerns, or complaints.    Cancer Staging  Invasive ductal carcinoma of right breast [facility]) Staging form: Breast, AJCC 8th Edition - Pathologic stage from 07/22/2024: Stage IB (pT1b, pN0, cM0, G3, ER-, PR-, HER2-) - Signed by [name] [name], MD on 07/22/2024 Stage prefix: Initial diagnosis Histologic grading system: 3 grade system   [name] [name], MD   09/13/2024 10:49 AM     "

## 2024-09-14 ENCOUNTER — Inpatient Hospital Stay
Admission: AD | Admit: 2024-09-14 | Discharge: 2024-09-14 | Disposition: A | Discharge status: Home / Self Care | Source: Ambulatory Visit | Attending: Oncology | Admitting: Oncology

## 2024-09-14 ENCOUNTER — Encounter: Payer: Self-pay | Admitting: Oncology

## 2024-09-14 ENCOUNTER — Other Ambulatory Visit: Payer: Self-pay | Admitting: Oncology

## 2024-09-14 DIAGNOSIS — C50911 Malignant neoplasm of unspecified site of right female breast: Secondary | ICD-10-CM

## 2024-09-14 LAB — GLUCOSE, CAPILLARY: Glucose-Capillary: 160 mg/dL — ABNORMAL HIGH (ref 70–99)

## 2024-09-14 MED ORDER — FLUDEOXYGLUCOSE F - 18 (FDG) INJECTION
9.1000 | Freq: Once | INTRAVENOUS | Status: AC | PRN
  Administered 2024-09-14: 9.38 via INTRAVENOUS

## 2024-09-15 ENCOUNTER — Other Ambulatory Visit: Payer: Self-pay

## 2024-09-15 ENCOUNTER — Inpatient Hospital Stay

## 2024-09-17 ENCOUNTER — Other Ambulatory Visit: Payer: Self-pay | Admitting: Oncology

## 2024-09-20 ENCOUNTER — Inpatient Hospital Stay

## 2024-09-20 ENCOUNTER — Other Ambulatory Visit: Payer: Self-pay | Admitting: Oncology

## 2024-09-20 ENCOUNTER — Inpatient Hospital Stay: Admitting: Oncology

## 2024-09-20 ENCOUNTER — Other Ambulatory Visit: Payer: Self-pay

## 2024-09-20 DIAGNOSIS — C50911 Malignant neoplasm of unspecified site of right female breast: Secondary | ICD-10-CM

## 2024-09-21 ENCOUNTER — Other Ambulatory Visit: Payer: Self-pay

## 2024-09-22 ENCOUNTER — Encounter: Payer: Self-pay | Admitting: Oncology

## 2024-09-22 ENCOUNTER — Other Ambulatory Visit: Payer: Self-pay

## 2024-09-23 ENCOUNTER — Other Ambulatory Visit: Payer: Self-pay | Admitting: Oncology

## 2024-09-23 ENCOUNTER — Telehealth: Payer: Self-pay | Admitting: Oncology

## 2024-09-23 NOTE — Telephone Encounter (Signed)
 Patient wants to cancel treatment on Monday because of the weather, she says she can only do Mondays and requests for her treatment to be changed to Monday feb 9th. Please advise.

## 2024-09-24 ENCOUNTER — Telehealth: Payer: Self-pay | Admitting: Oncology

## 2024-09-24 ENCOUNTER — Encounter: Payer: Self-pay | Admitting: Oncology

## 2024-09-24 ENCOUNTER — Other Ambulatory Visit: Payer: Self-pay

## 2024-09-24 NOTE — Telephone Encounter (Signed)
 Pt called to r/s her appts from 2/9 to 2/4. Stated she will have a ride on 2/4.   Dr.Finnegan, can you update the appt dates in IS to  be 2/4.  Tenitia-pt stated appts after 12pm would work better due to her transportation.    Please call pt back with an update.

## 2024-09-27 ENCOUNTER — Inpatient Hospital Stay: Admitting: Oncology

## 2024-09-27 ENCOUNTER — Inpatient Hospital Stay

## 2024-09-27 ENCOUNTER — Inpatient Hospital Stay: Attending: Oncology

## 2024-09-28 ENCOUNTER — Telehealth: Payer: Self-pay | Admitting: Oncology

## 2024-09-28 NOTE — Telephone Encounter (Signed)
 Duplicate message

## 2024-09-28 NOTE — Telephone Encounter (Signed)
 Best to keep as scheduled.

## 2024-09-28 NOTE — Telephone Encounter (Signed)
 Patient called wanting to come tomorrow 2/4 instead of 2/9 for treatment. I told her Dr. [name] would have to change the order first before that was an option. Please advise

## 2024-09-28 NOTE — Telephone Encounter (Signed)
 Can the schedules accommodate the change?  Is there a specific reason she wanted to r/s?

## 2024-09-29 ENCOUNTER — Encounter: Payer: Self-pay | Admitting: Oncology

## 2024-09-29 ENCOUNTER — Inpatient Hospital Stay

## 2024-09-29 ENCOUNTER — Other Ambulatory Visit: Payer: Self-pay

## 2024-09-29 NOTE — Telephone Encounter (Signed)
 Med was d/c with recent hospital dc.  MyChart msg sent to pt inquiring if refill needed.

## 2024-09-30 NOTE — Progress Notes (Signed)
 Pharmacist Chemotherapy Monitoring - Initial Assessment    Anticipated start date: 10/04/24   The following has been reviewed per standard work regarding the patient's treatment regimen: The patient's diagnosis, treatment plan and drug doses, and organ/hematologic function Lab orders and baseline tests specific to treatment regimen  The treatment plan start date, drug sequencing, and pre-medications Prior authorization status  Patient's documented medication list, including drug-drug interaction screen and prescriptions for anti-emetics and supportive care specific to the treatment regimen The drug concentrations, fluid compatibility, administration routes, and timing of the medications to be used The patient's access for treatment and lifetime cumulative dose history, if applicable  The patient's medication allergies and previous infusion related reactions, if applicable  stage Ib triple negative invasive carcinoma of right breast.  Changes made to treatment plan:  Patient only received 1 cycle of Adriamycin  and Cytoxan  which was then discontinued secondary to toxicity.  Taxotere and Cytoxan  only for additional 5 cycles  Follow up needed:  N/A   Tina Carrillo, RPH, 09/30/2024  2:31 PM

## 2024-10-04 ENCOUNTER — Inpatient Hospital Stay

## 2024-10-04 ENCOUNTER — Inpatient Hospital Stay: Attending: Oncology

## 2024-10-04 ENCOUNTER — Inpatient Hospital Stay: Admitting: Oncology

## 2024-10-11 ENCOUNTER — Inpatient Hospital Stay: Admitting: Nurse Practitioner

## 2024-10-11 ENCOUNTER — Inpatient Hospital Stay

## 2024-10-13 ENCOUNTER — Inpatient Hospital Stay

## 2024-10-18 ENCOUNTER — Inpatient Hospital Stay

## 2024-10-18 ENCOUNTER — Inpatient Hospital Stay: Admitting: Oncology

## 2024-10-25 ENCOUNTER — Inpatient Hospital Stay: Admitting: Oncology

## 2024-10-25 ENCOUNTER — Inpatient Hospital Stay

## 2024-11-01 ENCOUNTER — Inpatient Hospital Stay: Admitting: Oncology

## 2024-11-01 ENCOUNTER — Inpatient Hospital Stay

## 2024-11-08 ENCOUNTER — Inpatient Hospital Stay: Admitting: Oncology

## 2024-11-08 ENCOUNTER — Inpatient Hospital Stay: Admitting: Nurse Practitioner

## 2024-11-08 ENCOUNTER — Inpatient Hospital Stay

## 2024-11-15 ENCOUNTER — Inpatient Hospital Stay: Admitting: Oncology

## 2024-11-15 ENCOUNTER — Inpatient Hospital Stay

## 2024-11-15 ENCOUNTER — Inpatient Hospital Stay: Admitting: Nurse Practitioner

## 2024-11-22 ENCOUNTER — Inpatient Hospital Stay

## 2024-11-22 ENCOUNTER — Inpatient Hospital Stay: Admitting: Nurse Practitioner

## 2024-11-22 ENCOUNTER — Inpatient Hospital Stay: Admitting: Oncology

## 2024-11-29 ENCOUNTER — Inpatient Hospital Stay

## 2024-11-29 ENCOUNTER — Inpatient Hospital Stay: Admitting: *Deleted

## 2024-11-29 ENCOUNTER — Inpatient Hospital Stay: Admitting: Nurse Practitioner

## 2024-11-29 ENCOUNTER — Inpatient Hospital Stay: Admitting: Oncology

## 2024-12-06 ENCOUNTER — Inpatient Hospital Stay: Admitting: Oncology

## 2024-12-06 ENCOUNTER — Inpatient Hospital Stay

## 2024-12-06 ENCOUNTER — Inpatient Hospital Stay: Admitting: Nurse Practitioner

## 2024-12-13 ENCOUNTER — Inpatient Hospital Stay: Admitting: Oncology

## 2024-12-13 ENCOUNTER — Inpatient Hospital Stay

## 2024-12-20 ENCOUNTER — Inpatient Hospital Stay

## 2024-12-20 ENCOUNTER — Inpatient Hospital Stay: Admitting: Oncology

## 2024-12-27 ENCOUNTER — Inpatient Hospital Stay

## 2024-12-27 ENCOUNTER — Inpatient Hospital Stay: Admitting: Oncology
